# Patient Record
Sex: Female | Born: 1960 | Race: Black or African American | Hispanic: No | Marital: Single | Smoking: Never smoker
Health system: Southern US, Community
[De-identification: ages and names within clinical notes are randomized; demographics above are authoritative.]

## PROBLEM LIST (undated history)

## (undated) DIAGNOSIS — I10 Essential (primary) hypertension: Secondary | ICD-10-CM

## (undated) DIAGNOSIS — E119 Type 2 diabetes mellitus without complications: Secondary | ICD-10-CM

## (undated) DIAGNOSIS — K2970 Gastritis, unspecified, without bleeding: Secondary | ICD-10-CM

## (undated) DIAGNOSIS — K219 Gastro-esophageal reflux disease without esophagitis: Secondary | ICD-10-CM

## (undated) HISTORY — DX: Type 2 diabetes mellitus without complications: E11.9

## (undated) HISTORY — PX: CARPAL TUNNEL RELEASE: SHX101

## (undated) HISTORY — DX: Gastro-esophageal reflux disease without esophagitis: K21.9

## (undated) HISTORY — PX: TRIGGER FINGER RELEASE: SHX641

## (undated) HISTORY — PX: PARTIAL HYSTERECTOMY: SHX80

## (undated) HISTORY — DX: Gastritis, unspecified, without bleeding: K29.70

---

## 2012-03-03 ENCOUNTER — Emergency Department (HOSPITAL_COMMUNITY): Payer: BC Managed Care – PPO

## 2012-03-03 ENCOUNTER — Emergency Department (HOSPITAL_COMMUNITY)
Admission: EM | Admit: 2012-03-03 | Discharge: 2012-03-03 | Disposition: A | Discharge status: Home / Self Care | Payer: BC Managed Care – PPO | Attending: Emergency Medicine | Admitting: Emergency Medicine

## 2012-03-03 ENCOUNTER — Encounter (HOSPITAL_COMMUNITY): Payer: Self-pay

## 2012-03-03 DIAGNOSIS — M940 Chondrocostal junction syndrome [Tietze]: Secondary | ICD-10-CM | POA: Insufficient documentation

## 2012-03-03 DIAGNOSIS — Z79899 Other long term (current) drug therapy: Secondary | ICD-10-CM | POA: Insufficient documentation

## 2012-03-03 DIAGNOSIS — R109 Unspecified abdominal pain: Secondary | ICD-10-CM | POA: Insufficient documentation

## 2012-03-03 DIAGNOSIS — R1110 Vomiting, unspecified: Secondary | ICD-10-CM

## 2012-03-03 DIAGNOSIS — E119 Type 2 diabetes mellitus without complications: Secondary | ICD-10-CM | POA: Insufficient documentation

## 2012-03-03 DIAGNOSIS — Z794 Long term (current) use of insulin: Secondary | ICD-10-CM | POA: Insufficient documentation

## 2012-03-03 DIAGNOSIS — R112 Nausea with vomiting, unspecified: Secondary | ICD-10-CM | POA: Insufficient documentation

## 2012-03-03 HISTORY — PX: ESOPHAGOGASTRODUODENOSCOPY: SHX1529

## 2012-03-03 LAB — URINALYSIS, ROUTINE W REFLEX MICROSCOPIC
Bilirubin Urine: NEGATIVE
Glucose, UA: >1000 mg/dL — AB
Hgb urine dipstick: NEGATIVE
Ketones, ur: NEGATIVE mg/dL
Leukocytes, UA: NEGATIVE
Nitrite: NEGATIVE
Protein, ur: NEGATIVE mg/dL
Specific Gravity, Urine: 1.02 (ref 1.005–1.030)
Urobilinogen, UA: 0.2 mg/dL (ref 0.0–1.0)
pH: 6 (ref 5.0–8.0)

## 2012-03-03 LAB — GLUCOSE, CAPILLARY: Glucose-Capillary: 189 mg/dL — ABNORMAL HIGH (ref 70–99)

## 2012-03-03 LAB — URINE MICROSCOPIC-ADD ON

## 2012-03-03 MED ORDER — RANITIDINE HCL 150 MG PO TABS: 150.0000 mg | ORAL_TABLET | Freq: Two times a day (BID) | ORAL | Status: DC

## 2012-03-03 MED ORDER — METOCLOPRAMIDE HCL 10 MG PO TABS: 10.0000 mg | ORAL_TABLET | Freq: Four times a day (QID) | ORAL | Status: DC | PRN

## 2012-03-03 MED ORDER — SUCRALFATE 1 GM/10ML PO SUSP: 1.0000 g | Freq: Four times a day (QID) | ORAL | Status: DC

## 2012-03-03 NOTE — ED Provider Notes (Signed)
History     CSN: [id number]  Arrival date & time 03/03/12  1242   First MD Initiated Contact with Patient 03/03/12 1254      Chief Complaint  Patient presents with  . Abdominal Pain    (Consider location/radiation/quality/duration/timing/severity/associated sxs/prior treatment) HPI Comments: Patient presents to the ER with complaints of nausea, vomiting and abdominal pain for one month. Patient has been seen by her doctor as well as and another ER. She has had blood work and an ultrasound and all have been normal. Patient still experiencing pain. She reports that the pain is daily and across the upper abdomen and around both sides at the ribs. She is not expressing any cough, shortness of breath or chest pain. There is no fever.  Patient is a 52 y.o. female presenting with abdominal pain.  Abdominal Pain Associated symptoms: no chest pain, no cough, no fever and no shortness of breath     Past Medical History  Diagnosis Date  . Diabetes mellitus without complication     History reviewed. No pertinent past surgical history.  No family history on file.  History  Substance Use Topics  . Smoking status: Not on file  . Smokeless tobacco: Not on file  . Alcohol Use: No    OB History   Grav Para Term Preterm Abortions TAB SAB Ect Mult Living                  Review of Systems  Constitutional: Negative for fever.  Respiratory: Negative for cough and shortness of breath.   Cardiovascular: Negative for chest pain.  Gastrointestinal: Positive for abdominal pain.  All other systems reviewed and are negative.    Allergies  Review of patient's allergies indicates no known allergies.  Home Medications  No current outpatient prescriptions on file.  BP 117/68  Pulse 94  Temp(Src) 98.1 F (36.7 C) (Oral)  Resp 18  Ht 5\' 6"  (1.676 m)  Wt 180 lb (81.647 kg)  BMI 29.07 kg/m2  SpO2 100%  Physical Exam  Constitutional: She is oriented to person, place, and time. She  appears well-developed and well-nourished. No distress.  HENT:  Head: Normocephalic and atraumatic.  Right Ear: Hearing normal.  Nose: Nose normal.  Mouth/Throat: Oropharynx is clear and moist and mucous membranes are normal.  Eyes: Conjunctivae and EOM are normal. Pupils are equal, round, and reactive to light.  Neck: Normal range of motion. Neck supple.  Cardiovascular: Normal rate, regular rhythm, S1 normal and S2 normal.  Exam reveals no gallop and no friction rub.   No murmur heard. Pulmonary/Chest: Effort normal and breath sounds normal. No respiratory distress. She exhibits no tenderness.    Abdominal: Soft. Normal appearance and bowel sounds are normal. There is no hepatosplenomegaly. There is no tenderness. There is no rebound, no guarding, no tenderness at McBurney's point and negative Murphy's sign. No hernia.  Musculoskeletal: Normal range of motion.  Neurological: She is alert and oriented to person, place, and time. She has normal strength. No cranial nerve deficit or sensory deficit. Coordination normal. GCS eye subscore is 4. GCS verbal subscore is 5. GCS motor subscore is 6.  Skin: Skin is warm, dry and intact. No rash noted. No cyanosis.  Psychiatric: She has a normal mood and affect. Her speech is normal and behavior is normal. Thought content normal.    ED Course  Procedures (including critical care time)  Labs Reviewed - No data to display No results found.   Diagnosis: 1. Nausea  and vomiting 2. Costochondritis 3. Abdominal pain    MDM  Patient complaining of abdominal pain for one month. She has had a very thorough workup already. Patient indicates that she has had blood work by her primary physician and ultrasound in the ER, neither showed any abnormalities. Patient's abdominal exam is actually quite benign today. She has significant tenderness over the anterior costal margin bilaterally. There is no crepitance. Patient denies any injury. She has good lung  sounds. Chest x-ray was normal.  At this point, patient has a benign abdominal exam and has had a very thorough workup. I do not feel that repeat blood work or imaging at this time other than the performed chest x-ray are indicated. Symptoms might simply be costochondritis. There is no concern for cardiac illness, as the patient is experiencing upper chest pain and all pain is reproducible in the lower costal margin.  Patient will be treated for musculoskeletal chest wall inflammatory pain. She is referred back to her primary care physician. She reports nausea and vomiting, but no active vomiting. I did discuss with the patient that she may need followup with gastroenterology if the GI symptoms continue.     Gilda Crease, MD 03/03/12 [phone]

## 2012-03-03 NOTE — ED Notes (Signed)
Complain of vomiting and abdominal pain. Last BM yesterday

## 2012-03-12 ENCOUNTER — Encounter: Payer: Self-pay | Admitting: Urgent Care

## 2012-03-12 ENCOUNTER — Ambulatory Visit (INDEPENDENT_AMBULATORY_CARE_PROVIDER_SITE_OTHER): Payer: BC Managed Care – PPO | Admitting: Urgent Care

## 2012-03-12 VITALS — BP 141/82 | HR 97 | Temp 98.4°F | Ht 66.0 in | Wt 181.8 lb

## 2012-03-12 DIAGNOSIS — R1010 Upper abdominal pain, unspecified: Secondary | ICD-10-CM | POA: Insufficient documentation

## 2012-03-12 DIAGNOSIS — K5900 Constipation, unspecified: Secondary | ICD-10-CM | POA: Insufficient documentation

## 2012-03-12 DIAGNOSIS — R109 Unspecified abdominal pain: Secondary | ICD-10-CM

## 2012-03-12 MED ORDER — PEG 3350-KCL-NA BICARB-NACL 420 G PO SOLR: 4000.0000 mL | ORAL | Status: DC

## 2012-03-12 NOTE — Patient Instructions (Addendum)
Colonoscopy & EGD with Dr Darrick Penna Take half of your diabetes medications (GLUCOTROL & GLUCOPHAGE) and INSULIN (Levemir 50units) the day prior to the procedure Hold diabetes medications day of procedure Bring all your medications and/or any insulin to the hospital the day of the procedure. Follow blood sugars, call us or your PCP if any problems. Begin Nexium 40mg  daily Stop Zantac Avoid all NSAIDs like ALEVE, ADVIL, IBUPROFEN or ASPIRIN Use MIRALAX 17 grams daily as directed for constipation WE have requested all labs, xrays, etc from your PCP & [facility] to review

## 2012-03-12 NOTE — Assessment & Plan Note (Signed)
On exam she does have significant abdominal wall pain. Would consider physical therapy for management pending ruling out other GI tract etiologies.

## 2012-03-12 NOTE — Assessment & Plan Note (Signed)
May be contributing to abdominal pain. Colonoscopy in the near future she has never had screening colonoscopy.  I have discussed risks & benefits which include, but are not limited to, bleeding, infection, perforation & drug reaction.  The patient agrees with this plan & written consent will be obtained.    Use MIRALAX 17 grams daily as directed for constipation

## 2012-03-12 NOTE — Assessment & Plan Note (Addendum)
Brianna [name] is a pleasant 52 y.o. female with severe chronic upper abdominal pain.  She is quite tender along both costal margins and exhibits abdominal wall tenderness on exam. She has been taking a significant dose of Aleve for quite some time now for the pain. It is difficult to determine whether treatment with NSAIDs for costochondritis and abdominal wall pain has caused an NSAID-induced gastritis or peptic ulcer disease.  Cannot rule out underlying GI tract issues at the same time.  She reports a normal abdominal ultrasound, HIDA scan, and CT scan making pancreatitis or biliary colic unlikely. Constipation may be contributing to her abdominal pain as well.  EGD with Dr. Darrick Penna.  I have discussed risks & benefits which include, but are not limited to, bleeding, infection, perforation & drug reaction.  The patient agrees with this plan & written consent will be obtained.   Begin Nexium 40 mg daily, #15 samples given Take half of your diabetes medications (GLUCOTROL & GLUCOPHAGE) and INSULIN (Levemir 50units) the day prior to the procedure Hold diabetes medications day of procedure Bring all your medications and/or any insulin to the hospital the day of the procedure. Follow blood sugars, call us or your PCP if any problems. Stop Zantac Avoid all NSAIDs like ALEVE, ADVIL, IBUPROFEN or ASPIRIN WE have requested all labs, xrays, etc from your PCP & [facility] to review

## 2012-03-12 NOTE — Progress Notes (Signed)
Faxed to PCP

## 2012-03-12 NOTE — Progress Notes (Signed)
Primary Care Physician: Clayburn Pert [place], [place]) Primary Gastroenterologist:  Dr. Jonette Eva  Chief Complaint  Patient presents with  . Abdominal Pain    HPI:  Brianna [name] is a 52 y.o. female here as a new patient for evaluation of chronic upper abdominal pain.  C/o 2 month history pain that felt like "gas".  She has been having constipation as well.  Usually has daily BMs, but reports BM q3 days now.  Describes pain as "burning pain" & "can't wear a bra".  Pain 7-9/10.  Worse laying down.  Eating makes worse. Movement makes better.  At times feels like a "knot in her stomach".  Pain radiates to her back.  No rash or jaundice.  C/o NV only after pain meds in ER.  Denies N/V now.  Weight remained stable.  Appetite poor.  Denies rectal bleeding or melena.  [place] #1.  2/9 went to Immediate Care & was sent to ER.  She reports a normal HIDA scan, abdominal ultrasound, and CT scan at [facility] (we have requested records). She reports taking Aleve 2 tablets every 4 hrs for the pain.  Denies heartburn, indigestion, dysphagia, odynophagia or anorexia.  Tried prilosec 20mg  daily for 1 month.  She was taking when necessary TUMS but stopped because she felt she was taking too much.    Labs from 02/27/12: CMP glucose 228, creatinine 0.49, otherwise normal. CBC normal. Amylase, lipase normal. Hemoglobin A1c 10. CXR: No evidence of acute cardiopulmonary disease. Results for orders placed during the hospital encounter of 03/03/12 (from the past 2016 hour(s))  URINALYSIS, ROUTINE W REFLEX MICROSCOPIC   Collection Time    03/03/12  1:21 PM      Result Value Range   Color, Urine YELLOW  YELLOW   APPearance HAZY (*) CLEAR   Specific Gravity, Urine 1.020  1.005 - 1.030   pH 6.0  5.0 - 8.0   Glucose, UA >1000 (*) NEGATIVE mg/dL   Hgb urine dipstick NEGATIVE  NEGATIVE   Bilirubin Urine NEGATIVE  NEGATIVE   Ketones, ur NEGATIVE  NEGATIVE mg/dL   Protein, ur NEGATIVE  NEGATIVE mg/dL    Urobilinogen, UA 0.2  0.0 - 1.0 mg/dL   Nitrite NEGATIVE  NEGATIVE   Leukocytes, UA NEGATIVE  NEGATIVE  URINE MICROSCOPIC-ADD ON   Collection Time    03/03/12  1:21 PM      Result Value Range   Squamous Epithelial / LPF FEW (*) RARE   WBC, UA 0-2  <3 WBC/hpf   RBC / HPF 3-6  <3 RBC/hpf   Bacteria, UA FEW (*) RARE  GLUCOSE, CAPILLARY   Collection Time    03/03/12  1:25 PM      Result Value Range   Glucose-Capillary 189 (*) 70 - 99 mg/dL   Past Medical History  Diagnosis Date  . Diabetes mellitus without complication     insulin dependent   Past Surgical History  Procedure Laterality Date  . Partial hysterectomy      Current Outpatient Prescriptions  Medication Sig Dispense Refill  . glipiZIDE (GLUCOTROL) 10 MG tablet Take 10 mg by mouth daily.      . insulin detemir (LEVEMIR) 100 UNIT/ML injection Inject 100 Units into the skin daily.      . metFORMIN (GLUCOPHAGE) 500 MG tablet Take 500 mg by mouth daily.      . naproxen sodium (ANAPROX) 220 MG tablet Take 220 mg by mouth See admin instructions. Pt is taking 2 every 4 hrs for pain      .  rosuvastatin (CRESTOR) 20 MG tablet Take 20 mg by mouth daily.      . traMADol (ULTRAM) 50 MG tablet Take 50 mg by mouth every 6 (six) hours as needed for pain.      . polyethylene glycol-electrolytes (TRILYTE) 420 G solution Take 4,000 mLs by mouth as directed.  4000 mL  0  . ranitidine (ZANTAC) 150 MG tablet Take 1 tablet (150 mg total) by mouth 2 (two) times daily.  60 tablet  0  . RELION SHORT PEN NEEDLES 31G X 8 MM MISC        No current facility-administered medications for this visit.    Allergies as of 03/12/2012 - Review Complete 03/12/2012  Allergen Reaction Noted  . Other Hives and Shortness Of Breath 03/03/2012  . Iodine Hives 03/03/2012  . Neosporin (neomycin-bacitracin zn-polymyx) Rash 03/03/2012    Family History  Problem Relation Age of Onset  . Lupus Sister   . Stroke Brother   . Diabetes Father   . Stroke  Mother     History   Social History  . Marital Status: Married    Spouse Name: N/A    Number of Children: 2  . 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 Smoking status: Never Smoker   . Smokeless tobacco: Not on file  . Alcohol Use: No  . Drug Use: No  . Sexually Active: Not on file   Other Topics Concern  . Not on file   Social History Narrative   Lives w/ husband 2 kids-17/20 healthy    Review of Systems: Gen: Denies any fever, chills, sweats, anorexia, fatigue, weakness, malaise, weight loss, and sleep disorder CV: Denies chest pain, angina, palpitations, syncope, orthopnea, PND, peripheral edema, and claudication. Resp: Denies dyspnea at rest, dyspnea with exercise, cough, sputum, wheezing, coughing up blood, and pleurisy. GI: Denies vomiting blood, jaundice, and fecal incontinence.    GU : Denies urinary burning, blood in urine, urinary frequency, urinary hesitancy, nocturnal urination, and urinary incontinence. MS: Denies joint pain, limitation of movement, and swelling, stiffness, low back pain, extremity pain. Denies muscle weakness, cramps, atrophy.  Derm: Denies rash, itching, dry skin, hives, moles, warts, or unhealing ulcers.  Psych: Denies depression, anxiety, memory loss, suicidal ideation, hallucinations, paranoia, and confusion. Heme: Denies bruising, bleeding, and enlarged lymph nodes. Neuro:  Denies any headaches, dizziness, paresthesias. Endo:  Denies any problems with DM, thyroid, adrenal function.  Physical Exam: BP 141/82  Pulse 97  Temp(Src) 98.4 F (36.9 C) (Oral)  Ht 5\' 6"  (1.676 m)  Wt 181 lb 12.8 oz (82.464 kg)  BMI 29.36 kg/m2 No LMP recorded. Patient has had a hysterectomy. General:   Alert,  Well-developed, well-nourished, pleasant and cooperative in NAD Head:  Normocephalic and atraumatic. Eyes:  Sclera clear, no icterus.   Conjunctiva pink. Ears:   Normal auditory acuity. Nose:  No deformity, discharge, or lesions. Mouth:  No deformity or lesions,oropharynx pink & moist. Neck:  Supple; no masses or thyromegaly. Lungs:  Clear throughout to auscultation.   No wheezes, crackles, or rhonchi. No acute distress. Heart:  Regular rate and rhythm; no murmurs, clicks, rubs,  or gallops. Abdomen:  Normal bowel sounds.  No bruits.  Soft, non-distended without masses, hepatosplenomegaly or hernias noted.  +Carnett sign.  +Bilat costal margin tenderness to palpation.  No guarding or rebound tenderness.   Rectal:  Deferred. Msk:  Symmetrical without gross deformities.  Normal posture. Pulses:  Normal pulses noted. Extremities:  No clubbing or edema. Neurologic:  Alert and  oriented x4;  grossly normal neurologically. Skin:  Intact without significant lesions or rashes. Lymph Nodes:  No significant cervical adenopathy. Psych:  Alert and cooperative. Normal mood and affect.

## 2012-03-13 NOTE — Progress Notes (Signed)
[facility] records reviewed: 02/12/12 ABD [place]: patchy hepatic steatosis 02/12/12 Non-contrast CT A/P: normal 03/02/12 HIDA: low normal EF 37%

## 2012-03-15 LAB — CBC
Amylase: 63 units/L (ref 25–110)
HCT: 37 %
Hemoglobin: 13.1 g/dL (ref 12.0–16.0)
Hgb A1c MFr Bld: 10 % — AB (ref 4.0–6.0)
Lipase: 32 units/L (ref 0–53)
WBC: 4.9

## 2012-03-15 LAB — COMPREHENSIVE METABOLIC PANEL
ALT: 15 U/L (ref 7–35)
AST: 11 U/L
Albumin: 4.1
Alkaline Phosphatase: 103 U/L
BUN/Creatinine Ratio: 22
BUN: 11 mg/dL (ref 4–21)
Calcium: 9.4 mg/dL
Chloride: 26 mmol/L
Creat: 0.49
Glucose: 228 mg/dL
Potassium: 4.1 mmol/L
Sodium: 141 mmol/L (ref 137–147)
Total Bilirubin: 0.2 mg/dL
Total Protein: 6.7 g/dL

## 2012-03-23 ENCOUNTER — Telehealth: Payer: Self-pay | Admitting: Gastroenterology

## 2012-03-23 NOTE — Telephone Encounter (Signed)
She should use tylenol arthritis as directed for pain. If pain is severe, to ER. Thanks

## 2012-03-23 NOTE — Telephone Encounter (Signed)
Pt called to say she is scheduled for a procedure with SF on 3/31 and was asking if we could call something in to [facility] for pain. [phone]

## 2012-03-23 NOTE — Telephone Encounter (Signed)
Called and informed pt.  

## 2012-03-26 ENCOUNTER — Encounter (HOSPITAL_COMMUNITY): Payer: Self-pay | Admitting: Pharmacy Technician

## 2012-03-30 MED ORDER — SODIUM CHLORIDE 0.45 % IV SOLN: INTRAVENOUS | Status: DC

## 2012-04-02 ENCOUNTER — Encounter (HOSPITAL_COMMUNITY): Payer: Self-pay | Admitting: *Deleted

## 2012-04-02 ENCOUNTER — Encounter (HOSPITAL_COMMUNITY)
Admission: RE | Disposition: A | Discharge status: Home / Self Care | Payer: Self-pay | Source: Ambulatory Visit | Attending: Gastroenterology

## 2012-04-02 ENCOUNTER — Ambulatory Visit (HOSPITAL_COMMUNITY)
Admission: RE | Admit: 2012-04-02 | Discharge: 2012-04-02 | Disposition: A | Discharge status: Home / Self Care | Payer: BC Managed Care – PPO | Source: Ambulatory Visit | Attending: Gastroenterology | Admitting: Gastroenterology

## 2012-04-02 DIAGNOSIS — K648 Other hemorrhoids: Secondary | ICD-10-CM | POA: Insufficient documentation

## 2012-04-02 DIAGNOSIS — K621 Rectal polyp: Secondary | ICD-10-CM

## 2012-04-02 DIAGNOSIS — K2940 Chronic atrophic gastritis without bleeding: Secondary | ICD-10-CM | POA: Insufficient documentation

## 2012-04-02 DIAGNOSIS — K2990 Gastroduodenitis, unspecified, without bleeding: Secondary | ICD-10-CM

## 2012-04-02 DIAGNOSIS — R1010 Upper abdominal pain, unspecified: Secondary | ICD-10-CM

## 2012-04-02 DIAGNOSIS — D128 Benign neoplasm of rectum: Secondary | ICD-10-CM | POA: Insufficient documentation

## 2012-04-02 DIAGNOSIS — Z1211 Encounter for screening for malignant neoplasm of colon: Secondary | ICD-10-CM

## 2012-04-02 DIAGNOSIS — Z794 Long term (current) use of insulin: Secondary | ICD-10-CM | POA: Insufficient documentation

## 2012-04-02 DIAGNOSIS — K2970 Gastritis, unspecified, without bleeding: Secondary | ICD-10-CM

## 2012-04-02 DIAGNOSIS — K5900 Constipation, unspecified: Secondary | ICD-10-CM

## 2012-04-02 DIAGNOSIS — R1013 Epigastric pain: Secondary | ICD-10-CM

## 2012-04-02 DIAGNOSIS — R109 Unspecified abdominal pain: Secondary | ICD-10-CM

## 2012-04-02 DIAGNOSIS — Z01812 Encounter for preprocedural laboratory examination: Secondary | ICD-10-CM | POA: Insufficient documentation

## 2012-04-02 DIAGNOSIS — E119 Type 2 diabetes mellitus without complications: Secondary | ICD-10-CM | POA: Insufficient documentation

## 2012-04-02 DIAGNOSIS — D129 Benign neoplasm of anus and anal canal: Secondary | ICD-10-CM | POA: Insufficient documentation

## 2012-04-02 DIAGNOSIS — K620 Anal polyp: Secondary | ICD-10-CM

## 2012-04-02 HISTORY — PX: COLONOSCOPY WITH ESOPHAGOGASTRODUODENOSCOPY (EGD): SHX5779

## 2012-04-02 LAB — GLUCOSE, CAPILLARY
Glucose-Capillary: 309 mg/dL — ABNORMAL HIGH (ref 70–99)
Glucose-Capillary: 258 mg/dL — ABNORMAL HIGH (ref 70–99)

## 2012-04-02 SURGERY — COLONOSCOPY WITH ESOPHAGOGASTRODUODENOSCOPY (EGD)
Anesthesia: Moderate Sedation

## 2012-04-02 MED ORDER — MIDAZOLAM HCL 5 MG/5ML IJ SOLN
INTRAMUSCULAR | Status: DC | PRN
  Administered 2012-04-02 (×2): 2 mg via INTRAVENOUS
  Administered 2012-04-02 (×2): 1 mg via INTRAVENOUS

## 2012-04-02 MED ORDER — MEPERIDINE HCL 100 MG/ML IJ SOLN
INTRAMUSCULAR | Status: AC
  Filled 2012-04-02: qty 1

## 2012-04-02 MED ORDER — MIDAZOLAM HCL 5 MG/5ML IJ SOLN
INTRAMUSCULAR | Status: AC
  Filled 2012-04-02: qty 10

## 2012-04-02 MED ORDER — RANITIDINE HCL 150 MG PO TABS: 150.0000 mg | ORAL_TABLET | Freq: Two times a day (BID) | ORAL | Status: DC

## 2012-04-02 MED ORDER — OMEPRAZOLE 20 MG PO CPDR: DELAYED_RELEASE_CAPSULE | ORAL | Status: DC

## 2012-04-02 MED ORDER — LINACLOTIDE 145 MCG PO CAPS: ORAL_CAPSULE | ORAL | Status: DC

## 2012-04-02 MED ORDER — MEPERIDINE HCL 100 MG/ML IJ SOLN
INTRAMUSCULAR | Status: DC | PRN
  Administered 2012-04-02 (×4): 25 mg via INTRAVENOUS

## 2012-04-02 NOTE — Op Note (Signed)
[facility] [address] [place], [address]   ENDOSCOPY PROCEDURE REPORT  PATIENT: Brianna [name], Brianna [name]  MR#: [id number] BIRTHDATE: April 02, 1960 , 52  yrs. old GENDER: Female  ENDOSCOPIST: Jonette Eva, MD REFERRED BY:   Clayburn Pert [place], [place] PROCEDURE DATE: 04/02/2012 PROCEDURE:   EGD w/ biopsy for H.pylori  INDICATIONS:Epigastric pain. MEDICATIONS: TCS+ Demerol 25 mg IV and Versed 1mg  IV TOPICAL ANESTHETIC:   Cetacaine Spray  DESCRIPTION OF PROCEDURE:     Physical exam was performed.  Informed consent was obtained from the patient after explaining the benefits, risks, and alternatives to the procedure.  The patient was connected to the monitor and placed in the left lateral position.  Continuous oxygen was provided by nasal cannula and IV medicine administered through an indwelling cannula.  After administration of sedation, the patients esophagus was intubated and the EC-3890Li ([id number]) and EG-[id number] ([id number])  endoscope was advanced under direct visualization to the second portion of the duodenum.  The scope was removed slowly by carefully examining the color, texture, anatomy, and integrity of the mucosa on the way out.  The patient was recovered in endoscopy and discharged home in satisfactory condition.   ESOPHAGUS: The mucosa of the esophagus appeared normal.  STOMACH: Mild gastritis (inflammation) was found in the gastric antrum. Multiple biopsies were performed using cold forceps.   DUODENUM: The duodenal mucosa showed no abnormalities in the bulb and second portion of the duodenum.  COMPLICATIONS:   None  ENDOSCOPIC IMPRESSION: 1.   MILD  Gastritis (inflammation) uodenum  RECOMMENDATIONS: START OMEPRAZOLE TO TREAT GASTRITIS.  TAKE 30 MINUTES PRIOR TO YOUR MEALS TWICE DAILY FOR 3 MOS THEN ONCE DAILY FOR THE NEXT YEAR. START LINZESS EVERY MORNING TO TREAT CONSTIPATION. AVOID ITEMS THAT TRIGGER GASTRITIS. FOLLOW A HIGH  FIBER/LOW FAT DIET.  AVOID ITEMS THAT CAUSE BLOATING.  DRINK WATER TO KEEP YOUR URINE LIGHT YELLOW. OPV JUL 2014 Next colonoscopy in 10 years.   REPEAT EXAM:   _______________________________ Rosalie DoctorJonette Eva, MD 04/02/2012 3:39 PM       PATIENT NAME:  Brianna [name], Brianna [name] MR#: [id number]

## 2012-04-02 NOTE — H&P (Signed)
  Primary Care Physician:  Pcp Not In System Primary Gastroenterologist:  Dr. Darrick Penna  Pre-Procedure History & Physical: HPI:  Brianna [name] is a 52 y.o. female here for dyspepsia & screening.  Past Medical History  Diagnosis Date  . Diabetes mellitus without complication     insulin dependent    Past Surgical History  Procedure Laterality Date  . Partial hysterectomy    . Carpal tunnel release      left    Prior to Admission medications   Medication Sig Start Date End Date Taking? Authorizing Provider  acetaminophen (TYLENOL) 650 MG CR tablet Take 650 mg by mouth every 8 (eight) hours as needed for pain.   Yes Historical Provider, MD  glipiZIDE (GLUCOTROL) 10 MG tablet Take 10 mg by mouth daily.   Yes Historical Provider, MD  insulin detemir (LEVEMIR) 100 UNIT/ML injection Inject 100 Units into the skin daily.   Yes Historical Provider, MD  metFORMIN (GLUCOPHAGE) 500 MG tablet Take 500 mg by mouth daily.   Yes Historical Provider, MD  ranitidine (ZANTAC) 150 MG tablet Take 1 tablet (150 mg total) by mouth 2 (two) times daily. 03/03/12  Yes Gilda Crease, MD  rosuvastatin (CRESTOR) 20 MG tablet Take 20 mg by mouth daily.   Yes Historical Provider, MD    Allergies as of 03/12/2012 - Review Complete 03/12/2012  Allergen Reaction Noted  . Other Hives and Shortness Of Breath 03/03/2012  . Iodine Hives 03/03/2012  . Neosporin (neomycin-bacitracin zn-polymyx) Rash 03/03/2012    Family History  Problem Relation Age of Onset  . Lupus Sister   . Stroke Brother   . Diabetes Father   . Stroke Mother     History   Social History  . Marital Status: Married    Spouse Name: N/A    Number of Children: 2  . 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 Smoking status: Never Smoker   . Smokeless tobacco: Not on file  . Alcohol Use: No  . Drug Use: No  . Sexually Active: Not on file    Other Topics Concern  . Not on file   Social History Narrative   Lives w/ husband 2 kids-17/20 healthy    Review of Systems: See HPI, otherwise negative ROS   Physical Exam: BP 132/84  Pulse 97  Resp 18  SpO2 97% General:   Alert,  pleasant and cooperative in NAD Head:  Normocephalic and atraumatic. Neck:  Supple; Lungs:  Clear throughout to auscultation.    Heart:  Regular rate and rhythm. Abdomen:  Soft, nontender and nondistended. Normal bowel sounds, without guarding, and without rebound.   Neurologic:  Alert and  oriented x4;  grossly normal neurologically.  Impression/Plan:     DYSPEPSIA/screening  PLAN:  EGD/TCSTODAY

## 2012-04-02 NOTE — Op Note (Signed)
[facility] [address] [place], [address]   COLONOSCOPY PROCEDURE REPORT  PATIENT: [name], [name]  MR#: [id number] BIRTHDATE: [birth year] , 52  yrs. old GENDER: Female ENDOSCOPIST: Jonette Eva, MD REFERRED BY:   Clayburn Pert [place], [place] PROCEDURE DATE:  04/02/2012 PROCEDURE:   Colonoscopy with cold biopsy polypectomy INDICATIONS: AVERAGE RISK SCREENING PMHX: CONSTIPATION, ANAPROX USE.  MEDICATIONS: Demerol 75 mg IV and Versed 5 mg IV  DESCRIPTION OF PROCEDURE:    Physical exam was performed.  Informed consent was obtained from the patient after explaining the benefits, risks, and alternatives to procedure.  The patient was connected to monitor and placed in left lateral position. Continuous oxygen was provided by nasal cannula and IV medicine administered through an indwelling cannula.  After administration of sedation and rectal exam, the patients rectum was intubated and the EC-3890Li ([id number])  colonoscope was advanced under direct visualization to the cecum.  The scope was removed slowly by carefully examining the color, texture, anatomy, and integrity mucosa on the way out.  The patient was recovered in endoscopy and discharged home in satisfactory condition.       COLON FINDINGS: A sessile polyp measuring 3 mm in size was found in the rectum.  A polypectomy was performed with cold forceps.  , The colon was otherwise normal.  There was no diverticulosis, inflammation, polyps or cancers unless previously stated.  , Small internal hemorrhoids were found.  , and The colon was redundant. The patient was moved on to their back to reach the cecum.  Manual abdominal counter-pressure was used to reach the cecum.  PREP QUALITY: good. CECAL W/D TIME: 14 minutes COMPLICATIONS: None  ENDOSCOPIC IMPRESSION: 1.   SMALL RECTAL polyp 2.   Small internal hemorrhoids   RECOMMENDATIONS: FOLLOW A HIGH FIBER DIET.  AVOID ITEMS THAT CAUSE  BLOATING. DRINK WATER TO KEEP YOUR URINE LIGHT YELLOW. ADD LINZESS QAM. OPV IN AUG 2014 Next colonoscopy in 10 years WITH OVERTUBE/?PROPOFOL       _______________________________ eSignedJonette Eva, MD 04/02/2012 3:31 PM     PATIENT NAME:  Brianna [name], Brianna [name] MR#: [id number]

## 2012-04-04 ENCOUNTER — Encounter (HOSPITAL_COMMUNITY): Payer: Self-pay | Admitting: Gastroenterology

## 2012-04-04 ENCOUNTER — Telehealth: Payer: Self-pay | Admitting: Gastroenterology

## 2012-04-04 NOTE — Telephone Encounter (Signed)
Pt called back and said the Amitiza was over $400.00 and she cannot afford that either. Please advise!

## 2012-04-04 NOTE — Telephone Encounter (Signed)
Routing to Dr. Fields.  

## 2012-04-04 NOTE — Telephone Encounter (Signed)
PLEASE CALL PT. IF SHE CAN'T AFFORD LINZESS OR AMITIZA SHE SHOULD USE MIRALAX DAILY FOR 7 DAYS AND IF SHE DOESN'T HAVE A SATISFACTORY BM THEN SHE SHOULD INCREASE THE DOSE TO TWICE DAILY.

## 2012-04-04 NOTE — Telephone Encounter (Signed)
CALL IN RX FOR AMITIZA 24 MCG PO BID #[id number], [id number].

## 2012-04-04 NOTE — Telephone Encounter (Signed)
Taking Linaclotide caps, Dr. Darrick Penna told the patient if this is to expensive taking 1 a day should would change is to twice daily and it would be cheaper. She would like new directions and medication called to [facility] at [phone] please advise?

## 2012-04-04 NOTE — Telephone Encounter (Signed)
Called and informed pt.  

## 2012-04-04 NOTE — Telephone Encounter (Signed)
CANCEL RX FOR LINZESS.

## 2012-04-04 NOTE — Telephone Encounter (Signed)
Called the Rx to [facility] and spoke to [employer]. Informed pt also and told her to make sure she takes it with food.

## 2012-04-10 ENCOUNTER — Telehealth: Payer: Self-pay | Admitting: Gastroenterology

## 2012-04-10 NOTE — Telephone Encounter (Signed)
Called and informed pt.  

## 2012-04-10 NOTE — Telephone Encounter (Signed)
Please call pt. HER stomach Bx shows gastritis. HER ABD PAIN IS MOST LIKELY DUE TO CONSTIPATION/GASTRITIS.  CONTINUE OMEPRAZOLE TO TREAT GASTRITIS.  TAKE 30 MINUTES PRIOR TO YOUR MEALS TWICE DAILY FOR 3 MOS THEN ONCE DAILY FOR THE NEXT YEAR.  USE MIRALAX TO TREAT YOUR CONSTIPATION  AVOID ITEMS THAT TRIGGER GASTRITIS. SEE INFO BELOW  FOLLOW A HIGH FIBER/LOW FAT DIET. AVOID ITEMS THAT CAUSE BLOATING.   DRINK WATER TO KEEP YOUR URINE LIGHT YELLOW.  OPV JUL 2014 E30 SLF.  Next colonoscopy in 10 years.

## 2012-04-10 NOTE — Telephone Encounter (Signed)
Cc: PCP 10 yr TCS nicd

## 2012-06-23 NOTE — Progress Notes (Signed)
REVIEWED.  EGD MAR 2014 GASTRITIS Results TCS MAR 2014 [facility]  TCS IN 2024

## 2012-06-25 NOTE — Progress Notes (Signed)
Cc PCP 

## 2012-08-24 ENCOUNTER — Encounter: Payer: Self-pay | Admitting: Gastroenterology

## 2012-08-27 ENCOUNTER — Ambulatory Visit (INDEPENDENT_AMBULATORY_CARE_PROVIDER_SITE_OTHER): Payer: BC Managed Care – PPO | Admitting: Gastroenterology

## 2012-08-27 ENCOUNTER — Encounter: Payer: Self-pay | Admitting: Gastroenterology

## 2012-08-27 VITALS — BP 116/75 | HR 128 | Temp 98.4°F | Ht 67.0 in | Wt 160.8 lb

## 2012-08-27 DIAGNOSIS — R1010 Upper abdominal pain, unspecified: Secondary | ICD-10-CM

## 2012-08-27 DIAGNOSIS — R634 Abnormal weight loss: Secondary | ICD-10-CM

## 2012-08-27 DIAGNOSIS — R109 Unspecified abdominal pain: Secondary | ICD-10-CM

## 2012-08-27 LAB — COMPREHENSIVE METABOLIC PANEL
ALT: 13 U/L (ref 0–35)
AST: 11 U/L (ref 0–37)
Albumin: 4.4 g/dL (ref 3.5–5.2)
Alkaline Phosphatase: 91 U/L (ref 39–117)
BUN: 12 mg/dL (ref 6–23)
CO2: 25 mEq/L (ref 19–32)
Calcium: 9.9 mg/dL (ref 8.4–10.5)
Chloride: 100 mEq/L (ref 96–112)
Creat: 0.62 mg/dL (ref 0.50–1.10)
Glucose, Bld: 296 mg/dL — ABNORMAL HIGH (ref 70–99)
Potassium: 4 mEq/L (ref 3.5–5.3)
Sodium: 137 mEq/L (ref 135–145)
Total Bilirubin: 0.6 mg/dL (ref 0.3–1.2)
Total Protein: 7.2 g/dL (ref 6.0–8.3)

## 2012-08-27 LAB — CBC WITH DIFFERENTIAL/PLATELET
Basophils Absolute: 0 10*3/uL (ref 0.0–0.1)
Basophils Relative: 1 % (ref 0–1)
Eosinophils Absolute: 0.1 10*3/uL (ref 0.0–0.7)
Eosinophils Relative: 1 % (ref 0–5)
HCT: 42.1 % (ref 36.0–46.0)
Hemoglobin: 14.9 g/dL (ref 12.0–15.0)
Lymphocytes Relative: 42 % (ref 12–46)
Lymphs Abs: 2.3 10*3/uL (ref 0.7–4.0)
MCH: 30.1 pg (ref 26.0–34.0)
MCHC: 35.4 g/dL (ref 30.0–36.0)
MCV: 85.1 fL (ref 78.0–100.0)
Monocytes Absolute: 0.3 10*3/uL (ref 0.1–1.0)
Monocytes Relative: 6 % (ref 3–12)
Neutro Abs: 2.8 10*3/uL (ref 1.7–7.7)
Neutrophils Relative %: 50 % (ref 43–77)
Platelets: 280 10*3/uL (ref 150–400)
RBC: 4.95 MIL/uL (ref 3.87–5.11)
RDW: 13.8 % (ref 11.5–15.5)
WBC: 5.5 10*3/uL (ref 4.0–10.5)

## 2012-08-27 LAB — LIPASE: Lipase: 13 U/L (ref 0–75)

## 2012-08-27 LAB — TSH: TSH: 1.278 u[IU]/mL (ref 0.350–4.500)

## 2012-08-27 MED ORDER — HYDROCODONE-ACETAMINOPHEN 5-325 MG PO TABS: 1.0000 | ORAL_TABLET | Freq: Four times a day (QID) | ORAL | Status: DC | PRN

## 2012-08-27 NOTE — Assessment & Plan Note (Signed)
Persistent upper abdominal pain along with costal margin tenderness but now more LUQ/left flank pain and 20 pound weight loss. Prior CT without IV contrast. Abdominal wall/musculoskeletal pain likely contributing but 20 pound weight loss, anorexia is concerning.   Labs. Discuss with Dr. Darrick Penna. ?repeat CT but with IV contrast to exclude pancreatic abnormality.

## 2012-08-27 NOTE — Progress Notes (Signed)
Primary Care Physician: Venia Minks [facility] [place]) Primary Gastroenterologist:  Jonette Eva, MD   Chief Complaint  Patient presents with  . Abdominal Pain    HPI: Brianna [name] is a 52 y.o. female here for three-month followup. Was initially seen in March of this year. She has a history of chronic upper abdominal pain and constipation. Some of her pain was felt to be due to abdominal wall pain. Abdominal ultrasound showed fatty liver. CT of the abdomen pelvis without contrast showed fatty liver. HIDA scan showed gallbladder ejection fraction 37%. Colonoscopy in March showed a hyperplastic rectal polyp, small internal hemorrhoids. EGD showed mild gastritis but no H. pylori. She could not afford the Amitiza or Linzess. So she started Miralax.   Left upper/left flank pain. Back feels like someone stretching skin. Left flank, twisting pain. Never pain free. Tramadol does not help. Stays away from Aleve since EGD. BM regular. No melena, brbpr. Some heartburn but ok. Taking omeprazole once per day. Cut back on greasy foods. Eating makes it worse. Sugars running high. Hurts for shirt to touch. New since last OV, LUQ/left flank pain. No N/V. Tired of hurting. Appetite poor. Weight down 20 pounds.  Current Outpatient Prescriptions  Medication Sig Dispense Refill  . acetaminophen (TYLENOL) 650 MG CR tablet Take 650 mg by mouth every 8 (eight) hours as needed for pain.      Marland Kitchen glipiZIDE (GLUCOTROL) 10 MG tablet Take 10 mg by mouth daily.      . insulin detemir (LEVEMIR) 100 UNIT/ML injection Inject 70 Units into the skin daily.       Marland Kitchen omeprazole (PRILOSEC) 20 MG capsule Take 20 mg by mouth 2 (two) times daily. 1 po bid 30 minutes before meals for 3 mos then once daily FOR THE NEXT YEAR      . polyethylene glycol (MIRALAX / GLYCOLAX) packet Take 17 g by mouth daily.      . rosuvastatin (CRESTOR) 20 MG tablet Take 20 mg by mouth daily.       No current facility-administered  medications for this visit.    Allergies as of 08/27/2012 - Review Complete 08/27/2012  Allergen Reaction Noted  . Other Hives and Shortness Of Breath 03/03/2012  . Iodine Hives 03/03/2012  . Neosporin [neomycin-bacitracin zn-polymyx] Rash 03/03/2012    ROS:  General: Negative for fever, chills, fatigue, weakness. See hpi. ENT: Negative for hoarseness, difficulty swallowing , nasal congestion. CV: Negative for chest pain, angina, palpitations, dyspnea on exertion, peripheral edema.  Respiratory: Negative for dyspnea at rest, dyspnea on exertion, cough, sputum, wheezing.  GI: See history of present illness. GU:  Negative for dysuria, hematuria, urinary incontinence, urinary frequency, nocturnal urination.  Endo:see hpi   Physical Examination:   BP 116/75  Pulse 128  Temp(Src) 98.4 F (36.9 C) (Oral)  Ht 5\' 7"  (1.702 m)  Wt 160 lb 12.8 oz (72.938 kg)  BMI 25.18 kg/m2  General: Well-nourished, well-developed in no acute distress.  Eyes: No icterus. Mouth: Oropharyngeal mucosa moist and pink , no lesions erythema or exudate. Lungs: Clear to auscultation bilaterally.  Heart: Regular rate and rhythm, no murmurs rubs or gallops.  Abdomen: Bowel sounds are normal, luq/left flank pain, pain with palpation of both lower ribcage margins. No cva tenderness, nondistended, no hepatosplenomegaly or masses, no abdominal bruits or hernia , no rebound or guarding.   Extremities: No lower extremity edema. No clubbing or deformities. Neuro: Alert and oriented x 4   Skin: Warm and dry, no jaundice.  Psych: Alert and cooperative, normal mood and affect.

## 2012-08-27 NOTE — Patient Instructions (Addendum)
1.  Please have your bloodwork done today

## 2012-08-28 NOTE — Progress Notes (Signed)
NO PCP

## 2012-08-29 ENCOUNTER — Telehealth: Payer: Self-pay | Admitting: Gastroenterology

## 2012-08-29 NOTE — Telephone Encounter (Signed)
I called pt to let her know that I have not received lab results from LL yet, she is busy seeing pts. I will call when I have the results.  She said to let LL know that she is in pain. She said her left side hurts ( she rates at an 8 now) and it hurts most of the time. She is not having any n/v/d . Having regular BM's and not constipated.   I informed Tana Coast, PA , who said that she will address today. She is busy with patients now.

## 2012-08-29 NOTE — Telephone Encounter (Signed)
Pt called to see if her lab results were back yet. Please call her at [phone]

## 2012-08-30 ENCOUNTER — Other Ambulatory Visit: Payer: Self-pay | Admitting: Gastroenterology

## 2012-08-30 DIAGNOSIS — R109 Unspecified abdominal pain: Secondary | ICD-10-CM

## 2012-08-30 DIAGNOSIS — R1010 Upper abdominal pain, unspecified: Secondary | ICD-10-CM

## 2012-08-30 DIAGNOSIS — R634 Abnormal weight loss: Secondary | ICD-10-CM

## 2012-08-30 NOTE — Telephone Encounter (Signed)
Routing to Soledad Gerlach for CT to schedule.

## 2012-08-30 NOTE — Telephone Encounter (Signed)
Called and informed pt.  

## 2012-08-30 NOTE — Telephone Encounter (Signed)
Please let the patient know her labs were fine except her sugar was 296.  She needs CT A/P WITH IV contrast for LUQ pain, 20 pound weight loss. She has shellfish allergy but no concern for IV contrast, I confirmed with CT department.

## 2012-08-30 NOTE — Telephone Encounter (Signed)
CT is scheduled for Tuesday 9/2 at 2:30 I told patient to pick up oral contrast before Tuesday

## 2012-08-30 NOTE — Telephone Encounter (Signed)
Pt left VM asking for results.  

## 2012-09-04 ENCOUNTER — Ambulatory Visit (HOSPITAL_COMMUNITY)
Admission: RE | Admit: 2012-09-04 | Discharge: 2012-09-04 | Disposition: A | Discharge status: Home / Self Care | Payer: BC Managed Care – PPO | Source: Ambulatory Visit | Attending: Gastroenterology | Admitting: Gastroenterology

## 2012-09-04 DIAGNOSIS — R1010 Upper abdominal pain, unspecified: Secondary | ICD-10-CM

## 2012-09-04 DIAGNOSIS — E119 Type 2 diabetes mellitus without complications: Secondary | ICD-10-CM | POA: Insufficient documentation

## 2012-09-04 DIAGNOSIS — R1032 Left lower quadrant pain: Secondary | ICD-10-CM | POA: Insufficient documentation

## 2012-09-04 DIAGNOSIS — R634 Abnormal weight loss: Secondary | ICD-10-CM

## 2012-09-04 DIAGNOSIS — R109 Unspecified abdominal pain: Secondary | ICD-10-CM

## 2012-09-04 DIAGNOSIS — K7689 Other specified diseases of liver: Secondary | ICD-10-CM | POA: Insufficient documentation

## 2012-09-04 MED ORDER — IOHEXOL 300 MG/ML  SOLN
100.0000 mL | Freq: Once | INTRAMUSCULAR | Status: AC | PRN
  Administered 2012-09-04: 100 mL via INTRAVENOUS

## 2012-09-05 NOTE — Progress Notes (Signed)
Quick Note:  Nothing to explain pain or weight loss.  I will see if Dr. Darrick Penna has any other suggestions.  ?musculoskeletal source for pain but concerned regarding weight loss. ______

## 2012-09-06 NOTE — Progress Notes (Signed)
PT WITH CHRONIC ABDOMINAL PAIN AND WEIGHT LOSS. CONSIDER GYN OR GENERAL SURGERY REFERRAL FOR EXPLORATORY LAPAROTOMY.

## 2012-09-06 NOTE — Progress Notes (Signed)
REVIEWED.  

## 2012-09-06 NOTE — Progress Notes (Signed)
Quick Note:  Called and informed pt. She said she would like to have something for pain in her stomach and back. Please advise! ______

## 2012-09-14 ENCOUNTER — Other Ambulatory Visit: Payer: Self-pay | Admitting: Gastroenterology

## 2012-09-14 MED ORDER — HYDROCODONE-ACETAMINOPHEN 5-325 MG PO TABS
1.0000 | ORAL_TABLET | Freq: Four times a day (QID) | ORAL | Status: DC | PRN
Start: 2012-09-14 — End: 2015-09-24

## 2012-09-14 NOTE — Progress Notes (Signed)
Quick Note:  Please let patient know, Dr. Darrick Penna recommends surgical referral for abdominal pain/weight loss. ?exploratory surgery. Nothing we have done so far has explained her pain. If patient does not feel comfortable with that, I would recommend OV with SLF only.  RX for Vicodin written. ______

## 2012-09-17 NOTE — Progress Notes (Signed)
Quick Note:  LMOM to call. ______ 

## 2012-09-17 NOTE — Progress Notes (Signed)
LMOM. pt's appt. With Dr. Darrick Penna is Oct. 2 at 9:00. Also mailed pt. Appt. Card.

## 2012-09-17 NOTE — Progress Notes (Signed)
Quick Note:  Pt returned call and was informed. She said she had gotten the Rx for Vicodin. She does not think she wants any exploratory surgery. She will just see Dr. Darrick Penna back and discuss. Routing to [place] to schedule. ______

## 2012-10-04 ENCOUNTER — Ambulatory Visit (INDEPENDENT_AMBULATORY_CARE_PROVIDER_SITE_OTHER): Payer: BC Managed Care – PPO | Admitting: Gastroenterology

## 2012-10-04 ENCOUNTER — Encounter: Payer: Self-pay | Admitting: Gastroenterology

## 2012-10-04 ENCOUNTER — Other Ambulatory Visit: Payer: Self-pay | Admitting: Gastroenterology

## 2012-10-04 VITALS — BP 134/85 | HR 109 | Temp 98.4°F | Ht 68.0 in | Wt 164.4 lb

## 2012-10-04 DIAGNOSIS — R109 Unspecified abdominal pain: Secondary | ICD-10-CM

## 2012-10-04 DIAGNOSIS — K5900 Constipation, unspecified: Secondary | ICD-10-CM

## 2012-10-04 DIAGNOSIS — R1010 Upper abdominal pain, unspecified: Secondary | ICD-10-CM

## 2012-10-04 DIAGNOSIS — R634 Abnormal weight loss: Secondary | ICD-10-CM

## 2012-10-04 MED ORDER — AMITRIPTYLINE HCL 10 MG PO TABS: ORAL_TABLET | ORAL | Status: DC

## 2012-10-04 NOTE — Assessment & Plan Note (Signed)
SX BETTER CONTROLLED.  DRINK WATER TO KEEP YOUR URINE LIGHT YELLOW. EAT FIBER CONTINUE LINZESS. MIRALAX PRN OPV IN 3 MOS

## 2012-10-04 NOTE — Patient Instructions (Signed)
DRINK WATER TO KEEP YOUR URINE LIGHT YELLOW.  FOLLOW A HIGH FIBER DIET. SEE INFO BELOW.  SEE RHEUMATOLOGY IN [place].  ADD ELAVIL AT BEDTIME. INCREASE DOSE EVERY 3 DAYS. CALL ME WITH SIDE EFFECTS: CONSTIPATION, DROWSINESS, DRY MOUTH, URINARY DIFFICULTY.  CONTINUE LINZESS. MIRALAX AS NEEDED.  FOLLOW UP IN 3 MOS.   High-Fiber Diet A high-fiber diet changes your normal diet to include more whole grains, legumes, fruits, and vegetables. Changes in the diet involve replacing refined carbohydrates with unrefined foods. The calorie level of the diet is essentially unchanged. The Dietary Reference Intake (recommended amount) for adult males is 38 grams per day. For adult females, it is 25 grams per day. Pregnant and lactating women should consume 28 grams of fiber per day. Fiber is the intact part of a plant that is not broken down during digestion. Functional fiber is fiber that has been isolated from the plant to provide a beneficial effect in the body. PURPOSE  Increase stool bulk.   Ease and regulate bowel movements.   Lower cholesterol.  INDICATIONS THAT YOU NEED MORE FIBER  Constipation and hemorrhoids.   Uncomplicated diverticulosis (intestine condition) and irritable bowel syndrome.   Weight management.   As a protective measure against hardening of the arteries (atherosclerosis), diabetes, and cancer.   GUIDELINES FOR INCREASING FIBER IN THE DIET  Start adding fiber to the diet slowly. A gradual increase of about 5 more grams (2 slices of whole-wheat bread, 2 servings of most fruits or vegetables, or 1 bowl of high-fiber cereal) per day is best. Too rapid an increase in fiber may result in constipation, flatulence, and bloating.   Drink enough water and fluids to keep your urine clear or pale yellow. Water, juice, or caffeine-free drinks are recommended. Not drinking enough fluid may cause constipation.   Eat a variety of high-fiber foods rather than one type of fiber.    Try to increase your intake of fiber through using high-fiber foods rather than fiber pills or supplements that contain small amounts of fiber.   The goal is to change the types of food eaten. Do not supplement your present diet with high-fiber foods, but replace foods in your present diet.  INCLUDE A VARIETY OF FIBER SOURCES  Replace refined and processed grains with whole grains, canned fruits with fresh fruits, and incorporate other fiber sources. White rice, white breads, and most bakery goods contain little or no fiber.   Brown whole-grain rice, buckwheat oats, and many fruits and vegetables are all good sources of fiber. These include: broccoli, Brussels sprouts, cabbage, cauliflower, beets, sweet potatoes, white potatoes (skin on), carrots, tomatoes, eggplant, squash, berries, fresh fruits, and dried fruits.   Cereals appear to be the richest source of fiber. Cereal fiber is found in whole grains and bran. Bran is the fiber-rich outer coat of cereal grain, which is largely removed in refining. In whole-grain cereals, the bran remains. In breakfast cereals, the largest amount of fiber is found in those with "bran" in their names. The fiber content is sometimes indicated on the label.   You may need to include additional fruits and vegetables each day.   In baking, for 1 cup white flour, you may use the following substitutions:   1 cup whole-wheat flour minus 2 tablespoons.   1/2 cup white flour plus 1/2 cup whole-wheat flour.

## 2012-10-04 NOTE — Progress Notes (Signed)
  Subjective:    Patient ID: Brianna [name], female    DOB: July 21, 1960, 52 y.o.   MRN: [id number]  Pcp Not In System  HPI STILL HAVING TROUBLE WITH PAIN IN ABDOMEN. STOMACH PROBLEMS SINCE MAR 2014. 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 CAN GET SO BAD THAT SHE HAS CHEST AND ABD PAIN AND IT UNABLE TO LET SHIRT OR CLOTHES TOUCH HER SKIN. GETS COLD SOME TIMES. MAY HAVE PROBLEMS HAVING BM: CONSTIPATION TO WATERY STOOLS. MAY URINATE A LOT. OFF AND ON INDIGESTION. SISTER HAD LUPUS. MILD SOB: 2X/WEEK WHEN SHE CAN'T HAVE A BM. PT DENIES FEVER, CHILLS, BRBPR, nausea, vomiting, melena, problems swallowing, OR  problems with sedation. HAS WATERY STOOL AT LEAST ONCE A WEEK.  Past Medical History  Diagnosis Date  . 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 Partial hysterectomy    . Carpal tunnel release      left  . Colonoscopy with esophagogastroduodenoscopy (egd) N/A 04/02/2012    SLF: SMALL RECTAL polyp/Small internal hemorrhoids. Polyp hyperplastic. On EGD she had mild gastritis. Biopsy showed chronic and active gastritis and no H. pylori. Next colonoscopy in 10 years with overtube/? Propofol   Allergies  Allergen Reactions  . Other Hives and Shortness Of Breath    seafood  . Iodine Hives    shellfish  . Neosporin [Neomycin-Bacitracin Zn-Polymyx] Rash    Current Outpatient Prescriptions  Medication Sig Dispense Refill  . acetaminophen (TYLENOL) 650 MG CR tablet Take 650 mg by mouth every 8 (eight) hours as needed for pain.      Marland Kitchen glipiZIDE (GLUCOTROL) 10 MG tablet Take 10 mg by mouth daily.      Marland Kitchen HYDROcodone-acetaminophen (NORCO/VICODIN) 5-325 MG per tablet Take 1 tablet by mouth every 6 (six) hours as needed for pain.    Marland Kitchen insulin detemir (LEVEMIR) 100 UNIT/ML injection Inject 70 Units into the skin daily.     Marland Kitchen omeprazole (PRILOSEC) 20 MG capsule Take 20 mg once daily FOR THE NEXT YEAR      . polyethylene glycol  (MIRALAX / GLYCOLAX) packet Take 17 g by mouth daily.  USING PRN    . rosuvastatin (CRESTOR) 20 MG tablet Take 20 mg by mouth daily.           Review of Systems     Objective:   Physical Exam        Assessment & Plan:

## 2012-10-04 NOTE — Progress Notes (Signed)
NO PCP

## 2012-10-04 NOTE — Assessment & Plan Note (Signed)
SX BEGAN MAR 2014. EXTENSIVE WORKUP TO INCLUDE GYN EVALUATION DID NOT REVEAL AN ETIOLOGY. CHRONIC ABDOMINAL PAIN ASSOCIATED WITH CHEST AND BACK PAIN. PT HAS SIGNIFICANT PSYCHOSOCIAL STRESSORS.  REFER TO RHEUMATOLOGY ADD ELAVIL QHS CONTINUE LINZESS. MIRALAX PRN. OPV IN 3 MOS

## 2012-10-04 NOTE — Assessment & Plan Note (Signed)
PT'S WEIGHT IS NOW STABLE.  CONTINUE TO MONITOR SYMPTOMS. OPV IN 3 MOS

## 2012-10-08 NOTE — Progress Notes (Signed)
Reminder in epic °

## 2012-10-10 ENCOUNTER — Telehealth: Payer: Self-pay | Admitting: Gastroenterology

## 2012-10-10 NOTE — Telephone Encounter (Signed)
Patient has appointment with Dr. Dareen Piano on 10/23/12 @ 1:30

## 2012-11-15 ENCOUNTER — Telehealth: Payer: Self-pay

## 2012-11-15 NOTE — Telephone Encounter (Signed)
Pt called today to let you know that the Amitriptyline 10 mg did help some but would like to like to know if you could send in a strong does to the [facility] in [place]. Please advise

## 2012-11-21 MED ORDER — AMITRIPTYLINE HCL 25 MG PO TABS: ORAL_TABLET | ORAL | Status: DC

## 2012-11-21 NOTE — Telephone Encounter (Signed)
PLEASE CALL PT. I SENT RX FOR HIGHER DOSE. HER TABLETS ARE 25 mg tabs. She may take five of the 10 mg tabs until they run out then start TWO 25 mg tablets at bedtime. CALL WITH QUESTIONS OR CONCERNS.

## 2012-11-22 NOTE — Telephone Encounter (Signed)
Routing to [employer] is on vacation this week

## 2012-11-22 NOTE — Telephone Encounter (Signed)
LM with someone to have pt call.

## 2012-11-26 NOTE — Telephone Encounter (Signed)
Pt returned call and was informed.  

## 2012-12-04 ENCOUNTER — Telehealth: Payer: Self-pay

## 2012-12-04 NOTE — Telephone Encounter (Signed)
REVIEWED.  

## 2012-12-04 NOTE — Telephone Encounter (Signed)
Pt left VM that her medication ( Elavil ) Rx had not been sent to the pharmacy. I called [facility], [place] and spoke to [employer] and gave a verbal order. ( Looks like Rx just didn't go when Dr. Darrick Penna attempted to send on 11/21/2012). Called in the Elavil 25 mg #60 and sig is take two tablets PO qhs, 11 refills.

## 2013-01-04 ENCOUNTER — Encounter (HOSPITAL_COMMUNITY): Payer: Self-pay | Admitting: Emergency Medicine

## 2013-01-04 ENCOUNTER — Emergency Department (HOSPITAL_COMMUNITY)
Admission: EM | Admit: 2013-01-04 | Discharge: 2013-01-04 | Disposition: A | Discharge status: Home / Self Care | Payer: BC Managed Care – PPO | Attending: Emergency Medicine | Admitting: Emergency Medicine

## 2013-01-04 DIAGNOSIS — H44009 Unspecified purulent endophthalmitis, unspecified eye: Secondary | ICD-10-CM | POA: Insufficient documentation

## 2013-01-04 DIAGNOSIS — Z794 Long term (current) use of insulin: Secondary | ICD-10-CM | POA: Insufficient documentation

## 2013-01-04 DIAGNOSIS — E119 Type 2 diabetes mellitus without complications: Secondary | ICD-10-CM | POA: Insufficient documentation

## 2013-01-04 DIAGNOSIS — Z79899 Other long term (current) drug therapy: Secondary | ICD-10-CM | POA: Insufficient documentation

## 2013-01-04 DIAGNOSIS — H109 Unspecified conjunctivitis: Secondary | ICD-10-CM | POA: Insufficient documentation

## 2013-01-04 DIAGNOSIS — L03213 Periorbital cellulitis: Secondary | ICD-10-CM

## 2013-01-04 MED ORDER — TETRACAINE HCL 0.5 % OP SOLN
1.0000 [drp] | Freq: Once | OPHTHALMIC | Status: AC
Start: 2013-01-04 — End: 2013-01-04
  Administered 2013-01-04: 1 [drp] via OPHTHALMIC
  Filled 2013-01-04: qty 2

## 2013-01-04 MED ORDER — SULFAMETHOXAZOLE-TRIMETHOPRIM 800-160 MG PO TABS: 1.0000 | ORAL_TABLET | Freq: Two times a day (BID) | ORAL | Status: DC

## 2013-01-04 MED ORDER — FLUORESCEIN SODIUM 1 MG OP STRP
1.0000 | ORAL_STRIP | Freq: Once | OPHTHALMIC | Status: AC
  Administered 2013-01-04: 1 via OPHTHALMIC
  Filled 2013-01-04: qty 1

## 2013-01-04 MED ORDER — CIPROFLOXACIN HCL 0.3 % OP SOLN: 1.0000 [drp] | OPHTHALMIC | Status: DC

## 2013-01-04 NOTE — Discharge Instructions (Signed)
Conjunctivitis Conjunctivitis is commonly called "pink eye." Conjunctivitis can be caused by bacterial or viral infection, allergies, or injuries. There is usually redness of the lining of the eye, itching, discomfort, and sometimes discharge. There may be deposits of matter along the eyelids. A viral infection usually causes a watery discharge, while a bacterial infection causes a yellowish, thick discharge. Pink eye is very contagious and spreads by direct contact. You may be given antibiotic eyedrops as part of your treatment. Before using your eye medicine, remove all drainage from the eye by washing gently with warm water and cotton balls. Continue to use the medication until you have awakened 2 mornings in a row without discharge from the eye. Do not rub your eye. This increases the irritation and helps spread infection. Use separate towels from other household members. Wash your hands with soap and water before and after touching your eyes. Use cold compresses to reduce pain and sunglasses to relieve irritation from light. Do not wear contact lenses or wear eye makeup until the infection is gone. SEEK MEDICAL CARE IF:   Your symptoms are not better after 3 days of treatment.  You have increased pain or trouble seeing.  The outer eyelids become very red or swollen. Document Released: 01/28/2004 Document Revised: 03/14/2011 Document Reviewed: 12/20/2004  [facility] Patient Information 2014 [place]. Cellulitis Cellulitis is an infection of the skin and the tissue beneath it. The infected area is usually red and tender. Cellulitis occurs most often in the arms and lower legs.  CAUSES  Cellulitis is caused by bacteria that enter the skin through cracks or cuts in the skin. The most common types of bacteria that cause cellulitis are Staphylococcus and Streptococcus. SYMPTOMS   Redness and warmth.  Swelling.  Tenderness or pain.  Fever. DIAGNOSIS  Your caregiver can usually determine  what is wrong based on a physical exam. Blood tests may also be done. TREATMENT  Treatment usually involves taking an antibiotic medicine. HOME CARE INSTRUCTIONS   Take your antibiotics as directed. Finish them even if you start to feel better.  Keep the infected arm or leg elevated to reduce swelling.  Apply a warm cloth to the affected area up to 4 times per day to relieve pain.  Only take over-the-counter or prescription medicines for pain, discomfort, or fever as directed by your caregiver.  Keep all follow-up appointments as directed by your caregiver. SEEK MEDICAL CARE IF:   You notice red streaks coming from the infected area.  Your red area gets larger or turns dark in color.  Your bone or joint underneath the infected area becomes painful after the skin has healed.  Your infection returns in the same area or another area.  You notice a swollen bump in the infected area.  You develop new symptoms. SEEK IMMEDIATE MEDICAL CARE IF:   You have a fever.  You feel very sleepy.  You develop vomiting or diarrhea.  You have a general ill feeling (malaise) with muscle aches and pains. MAKE SURE YOU:   Understand these instructions.  Will watch your condition.  Will get help right away if you are not doing well or get worse. Document Released: 09/29/2004 Document Revised: 06/21/2011 Document Reviewed: 03/07/2011 [facility] Patient Information 2014 [place].

## 2013-01-04 NOTE — ED Provider Notes (Signed)
Medical screening examination/treatment/procedure(s) were conducted as a shared visit with non-physician practitioner(s) and myself.  I personally evaluated the patient during the encounter.  EKG Interpretation   None      No evidence of a corneal abrasion. Rx antibiotic drops and Bactrim po  Nat Christen, MD 01/04/13 1407

## 2013-01-04 NOTE — ED Provider Notes (Signed)
CSN: [id number]     Arrival date & time 01/04/13  0736 History   First MD Initiated Contact with Patient 01/04/13 [phone]     Chief Complaint  Patient presents with  . Eye Problem    lt   (Consider location/radiation/quality/duration/timing/severity/associated sxs/prior Treatment) HPI Comments: Patient presents to the ED with a chief complaint of left eye irritation.  She states that the eye has been bothering her since Dec. 9th.  She states that she was treated in [place] for the same.  She has tried steroids with no relief.  She states that the eye feels like there is something in it, but she denies getting anything in it.  She states that in the morning she will find crusty discharge.  She is diabetic.  The history is provided by the patient. No language interpreter was used.    Past Medical History  Diagnosis Date  . Diabetes mellitus without complication     insulin dependent   Past Surgical History  Procedure Laterality Date  . Partial hysterectomy    . Carpal tunnel release      left  . Colonoscopy with esophagogastroduodenoscopy (egd) N/A 04/02/2012    SLF: SMALL RECTAL polyp/Small internal hemorrhoids. Polyp hyperplastic. On EGD she had mild gastritis. Biopsy showed chronic and active gastritis and no H. pylori. Next colonoscopy in 10 years with overtube/? Propofol   Family History  Problem Relation Age of Onset  . Lupus Sister   . Stroke Brother   . Diabetes Father   . Stroke Mother    History  Substance Use Topics  . Smoking status: Never Smoker   . Smokeless tobacco: Not on file  . Alcohol Use: No   OB History   Grav Para Term Preterm Abortions TAB SAB Ect Mult Living                 Review of Systems  All other systems reviewed and are negative.    Allergies  Other; Iodine; and Neosporin  Home Medications   Current Outpatient Rx  Name  Route  Sig  Dispense  Refill  . acetaminophen (TYLENOL) 650 MG CR tablet   Oral   Take 650 mg by mouth every 8  (eight) hours as needed for pain.         Marland Kitchen amitriptyline (ELAVIL) 25 MG tablet      2 po qhs   60 tablet   11   . glipiZIDE (GLUCOTROL) 10 MG tablet   Oral   Take 10 mg by mouth daily.         Marland Kitchen HYDROcodone-acetaminophen (NORCO/VICODIN) 5-325 MG per tablet   Oral   Take 1 tablet by mouth every 6 (six) hours as needed for pain.   20 tablet   0   . insulin detemir (LEVEMIR) 100 UNIT/ML injection   Subcutaneous   Inject 70 Units into the skin daily.          . Linaclotide (LINZESS) 145 MCG CAPS capsule   Oral   Take 145 mcg by mouth daily.         Marland Kitchen omeprazole (PRILOSEC) 20 MG capsule   Oral   Take 20 mg by mouth 2 (two) times daily. 1 po bid 30 minutes before meals for 3 mos then once daily FOR THE NEXT YEAR         . polyethylene glycol (MIRALAX / GLYCOLAX) packet   Oral   Take 17 g by mouth daily.         Marland Kitchen  rosuvastatin (CRESTOR) 20 MG tablet   Oral   Take 20 mg by mouth daily.          BP 123/72  Pulse 110  Temp(Src) 97.3 F (36.3 C) (Oral)  Resp 21  Ht 5\' 6"  (1.676 m)  Wt 158 lb (71.668 kg)  BMI 25.51 kg/m2  SpO2 99% Physical Exam  Nursing note and vitals reviewed. Constitutional: She is oriented to person, place, and time. She appears well-developed and well-nourished.  HENT:  Head: Normocephalic and atraumatic.  Mild preseptal cellulitis, no pain with eye movement  Eyes: Conjunctivae and EOM are normal.  No fluorescein uptake to indicate corneal abrasion, eye pressure is 12 in the left and 16 in the right, no foreign body seen on slit lamp, no pain with eye movement or any indication of entrapment  Neck: Normal range of motion.  Cardiovascular: Normal rate.   Pulmonary/Chest: Effort normal.  Abdominal: She exhibits no distension.  Musculoskeletal: Normal range of motion.  Neurological: She is alert and oriented to person, place, and time.  Skin: Skin is dry.  Psychiatric: She has a normal mood and affect. Her behavior is normal.  Judgment and thought content normal.    ED Course  Procedures (including critical care time) Labs Review Labs Reviewed - No data to display Imaging Review No results found.  EKG Interpretation   None       MDM   1. Conjunctivitis   2. Preseptal cellulitis of left eye     Patient with conjunctivitis and probable cellulitis.  Nothing to suggest deep infection.  Normal pressures.  Visual acuity WNL, and no reported change.  Normal wood's lamp and slit lamp exam.  Will discharge with oral and opthalmic antibiotics.  I discussed the patient with Dr. Lacinda Axon, who agrees with the plan.  Opthalmology follow-up is recommended in 2 days if symptoms do not improve.    Montine Circle, PA-C 01/04/13 [phone]

## 2013-01-04 NOTE — ED Notes (Signed)
Pt has had ongoing swelling of lt eye with redness since Dec 9th, episodes of less swelling, treated in [facility] for same, given steroids, not any better. Lt eye swollen, Sclera ren.

## 2014-02-11 ENCOUNTER — Encounter: Payer: Self-pay | Admitting: Gastroenterology

## 2014-02-11 ENCOUNTER — Other Ambulatory Visit: Payer: Self-pay

## 2014-02-11 ENCOUNTER — Telehealth: Payer: Self-pay | Admitting: Gastroenterology

## 2014-02-11 MED ORDER — AMITRIPTYLINE HCL 25 MG PO TABS: ORAL_TABLET | ORAL | Status: DC

## 2014-02-11 NOTE — Telephone Encounter (Signed)
I refilled Elavil for 1 month. Needs f/u before any further refills.

## 2014-02-11 NOTE — Telephone Encounter (Signed)
APPOINTMENT MADE AND LETTER SENT °

## 2014-02-25 ENCOUNTER — Other Ambulatory Visit: Payer: Self-pay

## 2014-03-05 ENCOUNTER — Ambulatory Visit: Payer: Self-pay | Admitting: Gastroenterology

## 2014-03-17 ENCOUNTER — Ambulatory Visit (INDEPENDENT_AMBULATORY_CARE_PROVIDER_SITE_OTHER): Payer: BLUE CROSS/BLUE SHIELD | Admitting: Diagnostic Neuroimaging

## 2014-03-17 ENCOUNTER — Encounter: Payer: Self-pay | Admitting: Diagnostic Neuroimaging

## 2014-03-17 VITALS — BP 100/62 | HR 126 | Ht 67.5 in | Wt 145.8 lb

## 2014-03-17 DIAGNOSIS — G541 Lumbosacral plexus disorders: Secondary | ICD-10-CM

## 2014-03-17 DIAGNOSIS — E1144 Type 2 diabetes mellitus with diabetic amyotrophy: Secondary | ICD-10-CM

## 2014-03-17 MED ORDER — AMITRIPTYLINE HCL 25 MG PO TABS: 50.0000 mg | ORAL_TABLET | Freq: Every day | ORAL | Status: DC

## 2014-03-17 MED ORDER — GABAPENTIN 300 MG PO CAPS: 300.0000 mg | ORAL_CAPSULE | Freq: Three times a day (TID) | ORAL | Status: DC

## 2014-03-17 NOTE — Patient Instructions (Signed)
Increase gabapentin and amitriptyline as discussed.

## 2014-03-17 NOTE — Progress Notes (Signed)
[facility]  PATIENT: Brianna [name] DOB: 1960/01/11  REFERRING CLINICIAN: Sherri Sear, MD HISTORY FROM: patient (and EPIC chart review, referring note review) REASON FOR VISIT: new consult    HISTORICAL  CHIEF COMPLAINT:  Chief Complaint  Patient presents with  . New Evaluation    meralgia paresthesia     HISTORY OF PRESENT ILLNESS:   54 year old right-handed female here for consultation from Dr. Megan Salon, regarding right leg numbness and pain. 2 months ago patient had sudden onset of burning, pain, soreness in her right hip radiating to her right inner thigh, anterior thigh, lateral thigh, without numbness or pain below her right knee. No symptoms in the left leg. No symptoms in the bilateral hands. Patient has uncontrolled diabetes with morning sugars averaging from 200-300.   Patient reports and review of referring notes shows the following: Patient has been tried on amitriptyline 25 mg at bedtime and gabapentin 300 mg at bedtime without relief. She also is on hydrocodone and meloxicam. Patient was treated with nerve block injection for possible meralgia paresthetica, without relief. She had MRI of lumbar spine which is unremarkable except for mild degenerative changes.   REVIEW OF SYSTEMS: Full 14 system review of systems performed and notable only for restless legs not enough sleep change in appetite joint pain cramps flushing weight loss.  ALLERGIES: Allergies  Allergen Reactions  . Other Hives and Shortness Of Breath    seafood  . Iodine Hives    shellfish  . Neosporin [Neomycin-Bacitracin Zn-Polymyx] Rash    HOME MEDICATIONS: Outpatient Prescriptions Prior to Visit  Medication Sig Dispense Refill  . HYDROcodone-acetaminophen (NORCO/VICODIN) 5-325 MG per tablet Take 1 tablet by mouth every 6 (six) hours as needed for pain. 20 tablet 0  . omeprazole (PRILOSEC) 20 MG capsule Take 20 mg by mouth 2 (two) times daily. 1 po bid 30 minutes before  meals for 3 mos then once daily FOR THE NEXT YEAR    . polyethylene glycol (MIRALAX / GLYCOLAX) packet Take 17 g by mouth daily.    . rosuvastatin (CRESTOR) 20 MG tablet Take 20 mg by mouth daily.    Marland Kitchen amitriptyline (ELAVIL) 25 MG tablet 2 po qhs 60 tablet 0  . acetaminophen (TYLENOL) 650 MG CR tablet Take 650 mg by mouth every 8 (eight) hours as needed for pain.    . ciprofloxacin (CILOXAN) 0.3 % ophthalmic solution Place 1 drop into both eyes every 2 (two) hours. Administer 1 drop, every 2 hours, while awake, for 2 days. Then 1 drop, every 4 hours, while awake, for the next 5 days. 5 mL 0  . glipiZIDE (GLUCOTROL) 10 MG tablet Take 10 mg by mouth daily.    . insulin detemir (LEVEMIR) 100 UNIT/ML injection Inject 70 Units into the skin daily.     . Linaclotide (LINZESS) 145 MCG CAPS capsule Take 145 mcg by mouth daily.    Marland Kitchen sulfamethoxazole-trimethoprim (SEPTRA DS) 800-160 MG per tablet Take 1 tablet by mouth every 12 (twelve) hours. 20 tablet 0   No facility-administered medications prior to visit.    PAST MEDICAL HISTORY: Past Medical History  Diagnosis Date  . Diabetes mellitus without complication     insulin dependent    PAST SURGICAL HISTORY: Past Surgical History  Procedure Laterality Date  . Partial hysterectomy    . Carpal tunnel release      left  . Colonoscopy with esophagogastroduodenoscopy (egd) N/A 04/02/2012    SLF: SMALL RECTAL polyp/Small internal hemorrhoids. Polyp hyperplastic. On EGD  she had mild gastritis. Biopsy showed chronic and active gastritis and no H. pylori. Next colonoscopy in 10 years with overtube/? Propofol    FAMILY HISTORY: Family History  Problem Relation Age of Onset  . Lupus Sister   . Stroke Brother   . Diabetes Father   . Stroke Mother     SOCIAL HISTORY:  History   Social History  . Marital Status: Married    Spouse Name: N/A  . Number of Children: 2  . Years of Education: N/A   Occupational History  . 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 Smoking status: Never Smoker   . Smokeless tobacco: Not on file  . Alcohol Use: No  . Drug Use: No  . Sexual Activity: Not on file   Other Topics Concern  . Not on file   Social History Narrative   Lives w/ husband 2 kids-17/20 healthy     PHYSICAL EXAM  Filed Vitals:   03/17/14 1419  BP: 100/62  Pulse: 126  Height: 5' 7.5" (1.715 m)  Weight: 145 lb 12.8 oz (66.134 kg)    Body mass index is 22.49 kg/(m^2).   Visual Acuity Screening   Right eye Left eye Both eyes  Without correction: 20/30 20/50   With correction:       No flowsheet data found.  GENERAL EXAM: Patient is in no distress; well developed, nourished and groomed; neck is supple  CARDIOVASCULAR: Regular rate and rhythm, no murmurs, no carotid bruits  NEUROLOGIC: MENTAL STATUS: awake, alert, oriented to person, place and time, recent and remote memory intact, normal attention and concentration, language fluent, comprehension intact, naming intact, fund of knowledge appropriate CRANIAL NERVE: no papilledema on fundoscopic exam, pupils equal and reactive to light, visual fields full to confrontation, extraocular muscles intact, no nystagmus, facial sensation and strength symmetric, hearing intact, palate elevates symmetrically, uvula midline, shoulder shrug symmetric, tongue midline. MOTOR: normal bulk and tone, full strength in the BUE, LLE; RLE HF 4, KE/KF 4, DF 4 (WITH PAIN) SENSORY: normal and symmetric to light touch, pinprick, temperature; DECR VIB AT TOES (6 SEC); HYPERESTESIA / ALLODYNIA IN RIGHT LEG/THIGH COORDINATION: finger-nose-finger, fine finger movements normal REFLEXES: deep tendon reflexes present and symmetric; ABSENT AT ANKLES GAIT/STATION: narrow based gait; romberg is negative    DIAGNOSTIC DATA (LABS, IMAGING, TESTING) - I reviewed patient records, labs, notes, testing and imaging myself where available.  Lab Results  Component  Value Date   WBC 5.5 08/27/2012   HGB 14.9 08/27/2012   HCT 42.1 08/27/2012   MCV 85.1 08/27/2012   PLT 280 08/27/2012      Component Value Date/Time   NA 137 08/27/2012 1310   NA 141 02/27/2012   K 4.0 08/27/2012 1310   K 4.1 02/27/2012   CL 100 08/27/2012 1310   CL 26 02/27/2012   CO2 25 08/27/2012 1310   GLUCOSE 296* 08/27/2012 1310   BUN 12 08/27/2012 1310   BUN 11 02/27/2012   CREATININE 0.62 08/27/2012 1310   CALCIUM 9.9 08/27/2012 1310   CALCIUM 9.4 02/27/2012   PROT 7.2 08/27/2012 1310   PROT 6.7 02/27/2012   ALBUMIN 4.4 08/27/2012 1310   AST 11 08/27/2012 1310   AST 11 02/27/2012   ALT 13 08/27/2012 1310   ALKPHOS 91 08/27/2012 1310   ALKPHOS 103 02/27/2012   BILITOT 0.6 08/27/2012 1310   BILITOT 0.2 02/27/2012   No results found for: CHOL, HDL, LDLCALC, LDLDIRECT,  TRIG, CHOLHDL Lab Results  Component Value Date   HGBA1C 10.0* 02/27/2012   No results found for: VITAMINB12 Lab Results  Component Value Date   TSH 1.278 08/27/2012    02/27/14 MRI LUMBAR SPINE - Mild degenerative disc and facet arthropathy at L3-4 and L4-5. No evidence of disc herniation.   ASSESSMENT AND PLAN  54 y.o. year old female here with new onset right hip, thigh, leg burning and numbness sensation starting in January 2016, in setting of uncontrolled diabetes. Neuro exam shows motor and sensory deficits in the right proximal leg, suggestive of plexopathy. MRI L-spine unremarkable. 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   Dx: diabetic lumbosacral plexopathy  PLAN: - increase gabapentin and amitriptyline - emphasized importance of reducing sugar levels and improving diabetes control  Meds ordered this encounter  Medications  . amitriptyline (ELAVIL) 25 MG tablet    Sig: Take 2 tablets (50 mg total) by mouth at bedtime.    Dispense:  60 tablet    Refill:  6  . gabapentin (NEURONTIN) 300 MG capsule    Sig: Take 1 capsule (300 mg total) by mouth 3 (three) times daily.     Dispense:  90 capsule    Refill:  6   Return in about 3 months (around 06/17/2014).    Penni Bombard, MD 3/49/1791, 5:05 PM Certified in Neurology, Neurophysiology and Neuroimaging  [facility] [address] [place], [place] [address] [phone]

## 2014-04-02 ENCOUNTER — Encounter: Payer: Self-pay | Admitting: Gastroenterology

## 2014-04-02 ENCOUNTER — Ambulatory Visit (INDEPENDENT_AMBULATORY_CARE_PROVIDER_SITE_OTHER): Payer: BLUE CROSS/BLUE SHIELD | Admitting: Gastroenterology

## 2014-04-02 VITALS — BP 115/69 | HR 121 | Temp 98.6°F | Ht 68.0 in | Wt 147.6 lb

## 2014-04-02 DIAGNOSIS — R6881 Early satiety: Secondary | ICD-10-CM | POA: Diagnosis not present

## 2014-04-02 DIAGNOSIS — R109 Unspecified abdominal pain: Secondary | ICD-10-CM

## 2014-04-02 DIAGNOSIS — R634 Abnormal weight loss: Secondary | ICD-10-CM | POA: Diagnosis not present

## 2014-04-02 NOTE — Assessment & Plan Note (Signed)
Ongoing weight loss, documented tachycardia on multiple occasions, early satiety and anorexia. Need to rule out thyroid problem. She describes poorly controlled DM, I do not have her last HgbA1C.Cannot exclude gastroparesis. Last EGD 2014 with gastritis.  Labs have been requested. Await labs, further recommendations to follow.

## 2014-04-02 NOTE — Patient Instructions (Addendum)
1. I will review your recent labs done by Dr. Morton Stall. 2. Please have labs done to check your thyroid. 3. You have a prescription at [facility] in [place] for amitriptyline. You need to call them and have it filled and pick it up.  4. Further recommendations to follow.

## 2014-04-02 NOTE — Progress Notes (Signed)
Primary Care Physician: Terrial Rhodes, MD  Primary Gastroenterologist:  Barney Drain, MD   Chief Complaint  Patient presents with  . Follow-up    HPI: Brianna [name] is a 54 y.o. female here for follow up. She was last seen in 10/2012. H/o abdominal pain which has been managed with Elavil 50mg  at bedtime. Abdominal pain well-controlled with this regimen. Before she couldn't stand her shirt touching her skin. Now pain free. Complains of ongoing weight loss. No appetite. Worse now. No N/V. Early satiety. Water/juices. BM most days. Rare straining. miralax on occasion. Some brbpr with straining at times. No melena. On Mobic for several months for pain in the leg. Neurologist is treating her for diabetic plexopathy with neurontin and continued Elavil 50mg  at bedtime. She reports poorly controlled DM. Denies heartburn.  Wt Readings from Last 3 Encounters:  04/02/14 147 lb 9.6 oz (66.951 kg)  03/17/14 145 lb 12.8 oz (66.134 kg)  01/04/13 158 lb (71.668 kg)  10/2012  164 03/2012 180   Pulse Readings from Last 3 Encounters:  04/02/14 121  03/17/14 126  01/04/13 110     Current Outpatient Prescriptions  Medication Sig Dispense Refill  . amitriptyline (ELAVIL) 25 MG tablet Take 2 tablets (50 mg total) by mouth at bedtime. 60 tablet 6  . gabapentin (NEURONTIN) 300 MG capsule Take 1 capsule (300 mg total) by mouth 3 (three) times daily. 90 capsule 6  . HYDROcodone-acetaminophen (NORCO/VICODIN) 5-325 MG per tablet Take 1 tablet by mouth every 6 (six) hours as needed for pain. 20 tablet 0  . insulin aspart (NOVOLOG) 100 UNIT/ML injection Inject 10 Units into the skin 3 (three) times daily before meals. At breakfast, lunch and dinner    . meloxicam (MOBIC) 15 MG tablet Take 15 mg by mouth daily. with food  1  . omeprazole (PRILOSEC) 20 MG capsule Take 20 mg by mouth daily. 1 po bid 30 minutes before meals for 3 mos then once daily FOR THE NEXT YEAR    . ONE TOUCH ULTRA TEST  test strip   11  . polyethylene glycol (MIRALAX / GLYCOLAX) packet Take 17 g by mouth as needed.      No current facility-administered medications for this visit.    Allergies as of 04/02/2014 - Review Complete 04/02/2014  Allergen Reaction Noted  . Other Hives and Shortness Of Breath 03/03/2012  . Iodine Hives 03/03/2012  . Neosporin [neomycin-bacitracin zn-polymyx] Rash 03/03/2012   Past Medical History  Diagnosis Date  . Diabetes mellitus without complication     insulin dependent   Past Surgical History  Procedure Laterality Date  . Partial hysterectomy    . Carpal tunnel release      left  . Colonoscopy with esophagogastroduodenoscopy (egd) N/A 04/02/2012    SLF: SMALL RECTAL polyp/Small internal hemorrhoids. Polyp hyperplastic. On EGD she had mild gastritis. Biopsy showed chronic and active gastritis and no H. pylori. Next colonoscopy in 10 years with overtube/? Propofol  . Esophagogastroduodenoscopy  03/2012    SLF: gastritis   Family History  Problem Relation Age of Onset  . Lupus Sister   . Stroke Brother   . Diabetes Father   . Stroke Mother    History   Social History  . Marital Status: Married    Spouse Name: N/A  . Number of Children: 2  . 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  Smoking status: Never Smoker   . Smokeless tobacco: Not on file  . Alcohol Use: No  . Drug Use: No  . Sexual Activity: Not on file   Other Topics Concern  . None   Social History Narrative   Lives w/ husband 2 kids-17/20 healthy     ROS:  General: Negative for , fever, chills, fatigue, weakness.see hpi ENT: Negative for hoarseness, difficulty swallowing , nasal congestion. CV: Negative for chest pain, angina, palpitations, dyspnea on exertion, peripheral edema.  Respiratory: Negative for dyspnea at rest, dyspnea on exertion, cough, sputum, wheezing.  GI: See history of present  illness. GU:  Negative for dysuria, hematuria, urinary incontinence, urinary frequency, nocturnal urination.  Endo: see hpi   Physical Examination:   BP 115/69 mmHg  Pulse 121  Temp(Src) 98.6 F (37 C)  Ht 5\' 8"  (1.727 m)  Wt 147 lb 9.6 oz (66.951 kg)  BMI 22.45 kg/m2  General: Well-nourished, well-developed in no acute distress.  Eyes: No icterus. Mouth: Oropharyngeal mucosa moist and pink , no lesions erythema or exudate. Lungs: Clear to auscultation bilaterally.  Heart: Regular rate and rhythm, no murmurs rubs or gallops.  Abdomen: Bowel sounds are normal, nontender, nondistended, no hepatosplenomegaly or masses, no abdominal bruits or hernia , no rebound or guarding.   Extremities: No lower extremity edema. No clubbing or deformities. Neuro: Alert and oriented x 4   Skin: Warm and dry, no jaundice.   Psych: Alert and cooperative, normal mood and affect.   Imaging Studies: No results found.

## 2014-04-03 LAB — T4, FREE: Free T4: 1.03 ng/dL (ref 0.80–1.80)

## 2014-04-03 LAB — TSH: TSH: 1.671 u[IU]/mL (ref 0.350–4.500)

## 2014-04-03 NOTE — Progress Notes (Signed)
cc'ed to pcp °

## 2014-04-08 ENCOUNTER — Telehealth: Payer: Self-pay | Admitting: Gastroenterology

## 2014-04-08 NOTE — Telephone Encounter (Signed)
Pt called to see if her lab results were back. She had them done Thursday last week. I told her that they probably weren't back that soon, but I would send a note for the nurse to call her if they were available. [phone]

## 2014-04-09 NOTE — Progress Notes (Signed)
Quick Note:  Please tell her the thyroid labs were normal. Due to chronic tachycardia (elevated heart rate) I would like for her to see the cardiologist.  For weight loss, I still need to review her labs from Dr. Morton Stall. Further recommendations to follow. ______

## 2014-04-09 NOTE — Telephone Encounter (Signed)
Pt is calling to see about her lab work. Please advise

## 2014-04-09 NOTE — Telephone Encounter (Signed)
See result note.  

## 2014-04-10 ENCOUNTER — Other Ambulatory Visit: Payer: Self-pay

## 2014-04-10 DIAGNOSIS — R000 Tachycardia, unspecified: Secondary | ICD-10-CM

## 2014-04-10 NOTE — Progress Notes (Signed)
Quick Note:  PT is aware. OK to refer to cardiologist. ______

## 2014-04-14 NOTE — Progress Notes (Signed)
Quick Note:  Reviewed labs from 03/19/2014, Dr. Morton Stall. TSH 3.54, white blood cell count 7000, hemoglobin 13.5, hematocrit 40.4, platelets 337,000, BUN 16, creatinine 0.5, total bilirubin 0.3, alkaline phosphatase 160 high, AST 8, ALT 16, albumin 3.6, vitamin B12 681, vitamin D less than 13, C-peptide 0.8 low Hemoglobin A1c greater than 15%  Poorly controlled diabetes noted. Elevated alkphos noted. Vit D def  She needs the following: -Repeat LFTs, GGT. -gastric emptying study for chronic nausea, anorexia, weight loss. -did she get treated for vit D deficiency? ______

## 2014-04-15 ENCOUNTER — Other Ambulatory Visit: Payer: Self-pay

## 2014-04-15 DIAGNOSIS — R748 Abnormal levels of other serum enzymes: Secondary | ICD-10-CM

## 2014-04-15 NOTE — Progress Notes (Signed)
Quick Note:  Pt is aware. She will go to [place] for the labs.She is taking Vit D, just not sure how much. Ok to schedule the GES. ______

## 2014-04-15 NOTE — Progress Notes (Signed)
Quick Note:  Lab order faxed to [facility]. ______

## 2014-04-16 ENCOUNTER — Other Ambulatory Visit: Payer: Self-pay

## 2014-04-16 DIAGNOSIS — F5000 Anorexia nervosa, unspecified: Secondary | ICD-10-CM

## 2014-04-16 DIAGNOSIS — R110 Nausea: Secondary | ICD-10-CM

## 2014-04-16 DIAGNOSIS — R634 Abnormal weight loss: Secondary | ICD-10-CM

## 2014-04-16 NOTE — Progress Notes (Unsigned)
No PA is needed

## 2014-04-17 LAB — HEPATIC FUNCTION PANEL
ALT: 8 U/L (ref 0–35)
AST: 9 U/L (ref 0–37)
Albumin: 3.6 g/dL (ref 3.5–5.2)
Alkaline Phosphatase: 133 U/L — ABNORMAL HIGH (ref 39–117)
Bilirubin, Direct: <0.1 mg/dL (ref 0.0–0.3)
Total Bilirubin: 0.2 mg/dL (ref 0.2–1.2)
Total Protein: 6.6 g/dL (ref 6.0–8.3)

## 2014-04-17 LAB — GAMMA GT: GGT: 26 U/L (ref 7–51)

## 2014-04-21 ENCOUNTER — Ambulatory Visit (INDEPENDENT_AMBULATORY_CARE_PROVIDER_SITE_OTHER): Payer: BLUE CROSS/BLUE SHIELD | Admitting: Cardiology

## 2014-04-21 ENCOUNTER — Encounter (HOSPITAL_COMMUNITY)
Admission: RE | Admit: 2014-04-21 | Discharge: 2014-04-21 | Disposition: A | Discharge status: Home / Self Care | Payer: BLUE CROSS/BLUE SHIELD | Source: Ambulatory Visit | Attending: Gastroenterology | Admitting: Gastroenterology

## 2014-04-21 ENCOUNTER — Encounter (HOSPITAL_COMMUNITY): Payer: Self-pay

## 2014-04-21 ENCOUNTER — Encounter: Payer: Self-pay | Admitting: Cardiology

## 2014-04-21 VITALS — BP 130/80 | HR 116 | Ht 68.0 in | Wt 149.0 lb

## 2014-04-21 DIAGNOSIS — I471 Supraventricular tachycardia: Secondary | ICD-10-CM | POA: Diagnosis not present

## 2014-04-21 DIAGNOSIS — E1140 Type 2 diabetes mellitus with diabetic neuropathy, unspecified: Secondary | ICD-10-CM

## 2014-04-21 DIAGNOSIS — R634 Abnormal weight loss: Secondary | ICD-10-CM | POA: Insufficient documentation

## 2014-04-21 DIAGNOSIS — R0602 Shortness of breath: Secondary | ICD-10-CM | POA: Diagnosis not present

## 2014-04-21 DIAGNOSIS — R000 Tachycardia, unspecified: Secondary | ICD-10-CM

## 2014-04-21 DIAGNOSIS — F5000 Anorexia nervosa, unspecified: Secondary | ICD-10-CM | POA: Insufficient documentation

## 2014-04-21 DIAGNOSIS — IMO0002 Reserved for concepts with insufficient information to code with codable children: Secondary | ICD-10-CM

## 2014-04-21 DIAGNOSIS — E1165 Type 2 diabetes mellitus with hyperglycemia: Secondary | ICD-10-CM

## 2014-04-21 DIAGNOSIS — R110 Nausea: Secondary | ICD-10-CM | POA: Insufficient documentation

## 2014-04-21 MED ORDER — TECHNETIUM TC 99M SULFUR COLLOID
2.0000 | Freq: Once | INTRAVENOUS | Status: AC | PRN
  Administered 2014-04-21: 2 via ORAL

## 2014-04-21 NOTE — Progress Notes (Signed)
Cardiology Office Note  Date: 04/21/2014   ID: Brianna [name], DOB 10/18/1960, MRN [id number]  PCP: Terrial Rhodes, MD  Primary Cardiologist: Rozann Lesches, MD   Chief Complaint  Patient presents with  . Tachycardia    History of Present Illness: Brianna [name] is a 54 y.o. female referred for cardiology consultation by Ms. Bernarda Caffey for evaluation of tachycardia. Primary care physician is Dr. Morton Stall. I reviewed her recent office visit with gastroenterology in March as well as the subsequent lab work.  She is referred with findings of rapid heart rate on routine exams, although not specifically palpitations. This looks to be fairly long-standing. In reviewing the chart, I see that she has been documented to be tachycardic going back to 2014 on office visits. She states that she has noticed some shortness of breath with activity over the last few months, although is able to maintain her typical routine and ADLs. She does not endorse any sudden dizziness or syncope, no exertional chest pain. She is not aware of any diagnosis of cardiomyopathy or cardiac arrhythmias.  I reviewed the lab work outlined below, thyroid status normal, not anemic, normal LFTs and renal function. Diabetes however has not been controlled with hemoglobin A1c of 15%. ECG done today showed sinus tachycardia with possible biatrial enlargement and nonspecific T-wave changes.   Past Medical History  Diagnosis Date  . Type 2 diabetes mellitus with diabetic neuropathy   . Chronic abdominal pain   . Heartburn   . Gastritis     Past Surgical History  Procedure Laterality Date  . Partial hysterectomy    . Carpal tunnel release      left  . Colonoscopy with esophagogastroduodenoscopy (egd) N/A 04/02/2012    SLF: SMALL RECTAL polyp/Small internal hemorrhoids. Polyp hyperplastic. On EGD she had mild gastritis. Biopsy showed chronic and active gastritis and no H. pylori. Next colonoscopy in 10 years  with overtube/? Propofol  . Esophagogastroduodenoscopy  03/2012    SLF: gastritis    Current Outpatient Prescriptions  Medication Sig Dispense Refill  . amitriptyline (ELAVIL) 25 MG tablet Take 2 tablets (50 mg total) by mouth at bedtime. 60 tablet 6  . gabapentin (NEURONTIN) 300 MG capsule Take 1 capsule (300 mg total) by mouth 3 (three) times daily. 90 capsule 6  . HYDROcodone-acetaminophen (NORCO/VICODIN) 5-325 MG per tablet Take 1 tablet by mouth every 6 (six) hours as needed for pain. 20 tablet 0  . insulin aspart (NOVOLOG) 100 UNIT/ML injection Inject 10 Units into the skin 3 (three) times daily before meals. At breakfast, lunch and dinner    . insulin detemir (LEVEMIR) 100 UNIT/ML injection Inject into the skin every morning.    . meloxicam (MOBIC) 15 MG tablet Take 15 mg by mouth daily. with food  1  . omeprazole (PRILOSEC) 20 MG capsule Take 20 mg by mouth daily. 1 po bid 30 minutes before meals for 3 mos then once daily FOR THE NEXT YEAR    . ONE TOUCH ULTRA TEST test strip   11  . polyethylene glycol (MIRALAX / GLYCOLAX) packet Take 17 g by mouth as needed.     . Vitamin D, Ergocalciferol, (DRISDOL) 50000 UNITS CAPS capsule Take 50,000 Units by mouth 2 (two) times a week.   5   No current facility-administered medications for this visit.    Allergies:  Other; Iodine; and Neosporin   Social History: The patient  reports that she has never smoked. She has never used smokeless tobacco. She reports  that she does not drink alcohol or use illicit drugs.   Family History: The patient's family history includes Diabetes in her father; Lupus in her sister; Stroke in her brother and mother.   ROS:  Please see the history of present illness. Otherwise, complete review of systems is positive for leg neuropathy.  All other systems are reviewed and negative.   Physical Exam: VS:  BP 130/80 mmHg  Pulse 116  Ht 5\' 8"  (1.727 m)  Wt 149 lb (67.586 kg)  BMI 22.66 kg/m2  SpO2 98%, BMI Body  mass index is 22.66 kg/(m^2).  Wt Readings from Last 3 Encounters:  04/21/14 149 lb (67.586 kg)  04/02/14 147 lb 9.6 oz (66.951 kg)  03/17/14 145 lb 12.8 oz (66.134 kg)     General: Patient appears comfortable at rest. HEENT: Conjunctiva and lids normal, oropharynx clear. Neck: Supple, no elevated JVP or carotid bruits, no thyromegaly. Lungs: Clear to auscultation, nonlabored breathing at rest. Cardiac: Rapid regular rate and rhythm, no S3 or significant systolic murmur, no pericardial rub. Abdomen: Soft, nontender, bowel sounds present, no guarding or rebound. Extremities: No pitting edema, distal pulses 2+. Skin: Warm and dry. Musculoskeletal: No kyphosis. Neuropsychiatric: Alert and oriented x3, affect grossly appropriate.   ECG: ECG is ordered today and shows sinus tachycardia with possible biatrial enlargement and nonspecific T-wave changes.   Recent Labwork: 03/19/2014: Hemoglobin 13.5, platelets 337, BUN 16, creatinine 0.5, hemoglobin A1c greater than 15% 04/02/2014: Free T4 1.03 04/02/2014: TSH 1.671 04/15/2014: ALT 8; AST 9, GGT 26, albumin 3.6, alkaline phosphatase 133   Assessment and Plan:  1. Sinus tachycardia. Uncertain whether this is persistent or paroxysmal. Basic evaluation for secondary causes has not been remarkable, specifically she is not anemic, thyroid status normal, renal function normal, no recent acute illnesses. Poorly controlled diabetes may be contributing in the form of autonomic neuropathy with elevated resting heart rate - this is probably the most likely diagnosis. On the other hand, she has not been screened for cardiomyopathy, and we will obtain an echocardiogram. I would also like to get a 24 Holter monitor to better assess her true heart rate variability, see if it slows down when she is asleep. We will call her with the results.  2. Type 2 diabetes mellitus, poorly controlled with recent hemoglobin A1c greater than 15%. Would be reasonable for her  to consider endocrinology consultation.  3. Chronic abdominal pain, followed by gastroenterology.  Current medicines were reviewed with the patient today. No changes were made.   Orders Placed This Encounter  Procedures  . EKG 12-Lead  . Holter monitor - 24 hour  . 2D Echocardiogram without contrast    Disposition: Call with results.   Signed, Satira Sark, MD, [facility] 04/21/2014 3:08 PM    [place] [address], [place], [place] [address] Phone: [phone]; Fax: [phone]

## 2014-04-21 NOTE — Addendum Note (Signed)
Addended by: Laurine Blazer on: 04/21/2014 03:37 PM   Modules accepted: Level of Service

## 2014-04-21 NOTE — Patient Instructions (Signed)
Your physician has requested that you have an echocardiogram. Echocardiography is a painless test that uses sound waves to create images of your heart. It provides your doctor with information about the size and shape of your heart and how well your heart's chambers and valves are working. This procedure takes approximately one hour. There are no restrictions for this procedure. Your physician has recommended that you wear a 24 holter monitor. Holter monitors are medical devices that record the heart's electrical activity. Doctors most often use these monitors to diagnose arrhythmias. Arrhythmias are problems with the speed or rhythm of the heartbeat. The monitor is a small, portable device. You can wear one while you do your normal daily activities. This is usually used to diagnose what is causing palpitations/syncope (passing out). Office will contact with results via phone or letter.   Continue all current medications. Follow up based on test results from above.

## 2014-04-22 ENCOUNTER — Other Ambulatory Visit: Payer: Self-pay | Admitting: *Deleted

## 2014-04-22 DIAGNOSIS — R0602 Shortness of breath: Secondary | ICD-10-CM

## 2014-04-22 DIAGNOSIS — R000 Tachycardia, unspecified: Secondary | ICD-10-CM

## 2014-04-24 ENCOUNTER — Encounter: Payer: Self-pay | Admitting: Gastroenterology

## 2014-04-24 ENCOUNTER — Other Ambulatory Visit: Payer: Self-pay

## 2014-04-24 DIAGNOSIS — R7989 Other specified abnormal findings of blood chemistry: Secondary | ICD-10-CM

## 2014-04-24 DIAGNOSIS — R945 Abnormal results of liver function studies: Secondary | ICD-10-CM

## 2014-04-24 NOTE — Progress Notes (Signed)
APPOINTMENT MADE AND LETTER SENT °

## 2014-04-24 NOTE — Progress Notes (Signed)
Quick Note:  Pt is aware of results. ______ 

## 2014-04-24 NOTE — Progress Notes (Signed)
Quick Note:  Pt is aware and lab order on file for 07/24/2014. Routing to [place] to schedule the OV appt. ______

## 2014-04-24 NOTE — Progress Notes (Signed)
Quick Note:  GES is normal. See lab result note attached to LFTs/GGT.  Please let pt know results.   ______

## 2014-04-24 NOTE — Progress Notes (Signed)
Quick Note:  Alk phos better, down from 160 to 133. GGT normal. This implies that her Alkphos is up due to bone etiology. Possibly related to her vit D def and improving now on Vit D supplementation.   For early satiety, nausea, weight loss-->likely due to poorly controlled diabetes. Last HgbA1C >15%.  Recommend better DM control.  Return to see SLF only in 8-12 weeks. If increasing GI problems, consider abd u/s to relook at gb as next step. ______

## 2014-04-24 NOTE — Progress Notes (Signed)
Quick Note:  We should also repeat LFTs in 3 months. ______

## 2014-05-01 ENCOUNTER — Other Ambulatory Visit: Payer: Self-pay

## 2014-05-01 ENCOUNTER — Ambulatory Visit (INDEPENDENT_AMBULATORY_CARE_PROVIDER_SITE_OTHER): Payer: BLUE CROSS/BLUE SHIELD | Admitting: *Deleted

## 2014-05-01 ENCOUNTER — Ambulatory Visit (INDEPENDENT_AMBULATORY_CARE_PROVIDER_SITE_OTHER): Payer: BLUE CROSS/BLUE SHIELD

## 2014-05-01 DIAGNOSIS — I471 Supraventricular tachycardia: Secondary | ICD-10-CM

## 2014-05-01 DIAGNOSIS — R000 Tachycardia, unspecified: Secondary | ICD-10-CM

## 2014-05-01 DIAGNOSIS — R0602 Shortness of breath: Secondary | ICD-10-CM

## 2014-05-01 NOTE — Progress Notes (Signed)
Patient in office this morning for 24 hour holter placement with instructions given.

## 2014-05-02 ENCOUNTER — Telehealth: Payer: Self-pay | Admitting: *Deleted

## 2014-05-02 NOTE — Telephone Encounter (Signed)
Patient informed. 

## 2014-05-02 NOTE — Telephone Encounter (Signed)
-----   Message from Satira Sark, MD sent at 05/01/2014  2:01 PM EDT ----- Reviewed. LVEF is vigorous, no evidence of cardiomyopathy or clinically significant valvular abnormalities. There is a trivial pericardial effusion that is unlikely symptom provoking, or an explanation for her intermittently elevated heart rate. Will await results of her heart monitor.

## 2014-05-16 ENCOUNTER — Ambulatory Visit (INDEPENDENT_AMBULATORY_CARE_PROVIDER_SITE_OTHER): Payer: BLUE CROSS/BLUE SHIELD

## 2014-05-16 ENCOUNTER — Other Ambulatory Visit: Payer: Self-pay | Admitting: *Deleted

## 2014-05-16 DIAGNOSIS — R0602 Shortness of breath: Secondary | ICD-10-CM | POA: Diagnosis not present

## 2014-05-16 DIAGNOSIS — R000 Tachycardia, unspecified: Secondary | ICD-10-CM | POA: Diagnosis not present

## 2014-06-18 ENCOUNTER — Ambulatory Visit: Payer: Self-pay | Admitting: Diagnostic Neuroimaging

## 2014-06-18 ENCOUNTER — Telehealth: Payer: Self-pay | Admitting: Gastroenterology

## 2014-06-18 NOTE — Telephone Encounter (Signed)
Letter being mailed for blood work

## 2014-06-18 NOTE — Telephone Encounter (Signed)
Pt has OV with SF on 6/29 at 830 with SF and is on the July recall list to have repeat LFTs done. Should she have this done prior to her OV

## 2014-06-20 ENCOUNTER — Other Ambulatory Visit: Payer: Self-pay

## 2014-06-20 DIAGNOSIS — R945 Abnormal results of liver function studies: Secondary | ICD-10-CM

## 2014-06-20 DIAGNOSIS — R7989 Other specified abnormal findings of blood chemistry: Secondary | ICD-10-CM

## 2014-07-02 ENCOUNTER — Ambulatory Visit: Payer: BLUE CROSS/BLUE SHIELD | Admitting: Gastroenterology

## 2014-07-24 ENCOUNTER — Ambulatory Visit: Payer: BLUE CROSS/BLUE SHIELD | Admitting: Gastroenterology

## 2014-08-08 ENCOUNTER — Encounter: Payer: Self-pay | Admitting: *Deleted

## 2014-08-20 ENCOUNTER — Ambulatory Visit: Payer: BLUE CROSS/BLUE SHIELD | Admitting: Gastroenterology

## 2014-08-20 ENCOUNTER — Encounter: Payer: Self-pay | Admitting: Gastroenterology

## 2014-08-20 ENCOUNTER — Telehealth: Payer: Self-pay | Admitting: Gastroenterology

## 2014-08-20 NOTE — Telephone Encounter (Signed)
PATIENT WAS A NO SHOW AND LETTER SENT  °

## 2014-12-19 DIAGNOSIS — R809 Proteinuria, unspecified: Secondary | ICD-10-CM | POA: Insufficient documentation

## 2014-12-19 DIAGNOSIS — E785 Hyperlipidemia, unspecified: Secondary | ICD-10-CM | POA: Insufficient documentation

## 2015-03-10 ENCOUNTER — Other Ambulatory Visit: Payer: Self-pay | Admitting: Diagnostic Neuroimaging

## 2015-03-17 DIAGNOSIS — E1142 Type 2 diabetes mellitus with diabetic polyneuropathy: Secondary | ICD-10-CM | POA: Insufficient documentation

## 2015-09-24 ENCOUNTER — Encounter (HOSPITAL_COMMUNITY): Payer: Self-pay | Admitting: Cardiology

## 2015-09-24 ENCOUNTER — Emergency Department (HOSPITAL_COMMUNITY)
Admission: EM | Admit: 2015-09-24 | Discharge: 2015-09-24 | Disposition: A | Discharge status: Home / Self Care | Payer: BLUE CROSS/BLUE SHIELD | Attending: Emergency Medicine | Admitting: Emergency Medicine

## 2015-09-24 DIAGNOSIS — E1165 Type 2 diabetes mellitus with hyperglycemia: Secondary | ICD-10-CM | POA: Diagnosis not present

## 2015-09-24 DIAGNOSIS — L03115 Cellulitis of right lower limb: Secondary | ICD-10-CM | POA: Insufficient documentation

## 2015-09-24 DIAGNOSIS — Z794 Long term (current) use of insulin: Secondary | ICD-10-CM | POA: Insufficient documentation

## 2015-09-24 DIAGNOSIS — M79671 Pain in right foot: Secondary | ICD-10-CM | POA: Diagnosis present

## 2015-09-24 DIAGNOSIS — R739 Hyperglycemia, unspecified: Secondary | ICD-10-CM

## 2015-09-24 LAB — CBC WITH DIFFERENTIAL/PLATELET
Basophils Absolute: 0 10*3/uL (ref 0.0–0.1)
Basophils Relative: 0 %
Eosinophils Absolute: 0.1 10*3/uL (ref 0.0–0.7)
Eosinophils Relative: 2 %
HCT: 37 % (ref 36.0–46.0)
Hemoglobin: 12.6 g/dL (ref 12.0–15.0)
Lymphocytes Relative: 28 %
Lymphs Abs: 1.8 10*3/uL (ref 0.7–4.0)
MCH: 28.5 pg (ref 26.0–34.0)
MCHC: 34.1 g/dL (ref 30.0–36.0)
MCV: 83.7 fL (ref 78.0–100.0)
Monocytes Absolute: 0.4 10*3/uL (ref 0.1–1.0)
Monocytes Relative: 7 %
Neutro Abs: 4.1 10*3/uL (ref 1.7–7.7)
Neutrophils Relative %: 63 %
Platelets: 267 10*3/uL (ref 150–400)
RBC: 4.42 MIL/uL (ref 3.87–5.11)
RDW: 12.8 % (ref 11.5–15.5)
WBC: 6.5 10*3/uL (ref 4.0–10.5)

## 2015-09-24 LAB — BASIC METABOLIC PANEL
Anion gap: 1 — ABNORMAL LOW (ref 5–15)
BUN: 9 mg/dL (ref 6–20)
CO2: 25 mmol/L (ref 22–32)
Calcium: 8.5 mg/dL — ABNORMAL LOW (ref 8.9–10.3)
Chloride: 102 mmol/L (ref 101–111)
Creatinine, Ser: 0.54 mg/dL (ref 0.44–1.00)
GFR calc Af Amer: >60 mL/min (ref >60)
GFR calc non Af Amer: >60 mL/min (ref >60)
Glucose, Bld: 408 mg/dL — ABNORMAL HIGH (ref 65–99)
Potassium: 3.2 mmol/L — ABNORMAL LOW (ref 3.5–5.1)
Sodium: 128 mmol/L — ABNORMAL LOW (ref 135–145)

## 2015-09-24 MED ORDER — CLINDAMYCIN HCL 300 MG PO CAPS: 300.0000 mg | ORAL_CAPSULE | Freq: Four times a day (QID) | ORAL | 0 refills | Status: DC

## 2015-09-24 MED ORDER — HYDROCODONE-ACETAMINOPHEN 5-325 MG PO TABS: 1.0000 | ORAL_TABLET | ORAL | 0 refills | Status: DC | PRN

## 2015-09-24 MED ORDER — IBUPROFEN 600 MG PO TABS: 600.0000 mg | ORAL_TABLET | Freq: Four times a day (QID) | ORAL | 0 refills | Status: DC | PRN

## 2015-09-24 MED ORDER — CLINDAMYCIN PHOSPHATE 600 MG/50ML IV SOLN
600.0000 mg | Freq: Once | INTRAVENOUS | Status: AC
  Administered 2015-09-24: 600 mg via INTRAVENOUS
  Filled 2015-09-24: qty 50

## 2015-09-24 MED ORDER — HYDROCODONE-ACETAMINOPHEN 5-325 MG PO TABS
1.0000 | ORAL_TABLET | Freq: Once | ORAL | Status: AC
  Administered 2015-09-24: 1 via ORAL
  Filled 2015-09-24: qty 1

## 2015-09-24 MED ORDER — INSULIN ASPART 100 UNIT/ML ~~LOC~~ SOLN
8.0000 [IU] | Freq: Once | SUBCUTANEOUS | Status: AC
  Administered 2015-09-24: 8 [IU] via SUBCUTANEOUS
  Filled 2015-09-24: qty 1

## 2015-09-24 MED ORDER — KETOROLAC TROMETHAMINE 30 MG/ML IJ SOLN
30.0000 mg | Freq: Once | INTRAMUSCULAR | Status: AC
  Administered 2015-09-24: 30 mg via INTRAVENOUS
  Filled 2015-09-24: qty 1

## 2015-09-24 NOTE — Discharge Instructions (Signed)
Take your next dose of clindamycin tomorrow morning.  Use caution with the hydrocodone as this will make you drowsy.  Do not drive within 4 hours of taking this medication.  As discussed please elevate your foot as much as possible and apply warm compresses.

## 2015-09-24 NOTE — ED Provider Notes (Signed)
[facility] DEPT Provider Note   CSN: [id number] Arrival date & time: 09/24/15  1520     History   Chief Complaint No chief complaint on file.   HPI Brianna [name] is a 55 y.o. female presenting with a Three-day history with right foot pain, swelling and redness.  She suspects she may have been bit by an insect, but does not recall the incident.  Patient is a diabetic, stating her last CBG was 168.  There has been no drainage from the wound site, but she has had increased spreading redness of the dorsum of her foot.  She has taken ibuprofen 400 mg every 4 hours with no improvement in pain or swelling. Weight bearing worsens pain.  She denies history of gout.  She's had no fevers or chills, there is no radiation of pain into her leg.    The history is provided by the patient.    Past Medical History:  Diagnosis Date  . Chronic abdominal pain   . Gastritis   . Heartburn   . Type 2 diabetes mellitus with diabetic neuropathy [facility])     Patient Active Problem List   Diagnosis Date Noted  . Heart rate fast 04/21/2014  . Early satiety 04/02/2014  . Left flank pain 08/27/2012  . Loss of weight 08/27/2012  . Upper abdominal pain 03/12/2012  . Unspecified constipation 03/12/2012  . Abdominal wall pain 03/12/2012    Past Surgical History:  Procedure Laterality Date  . CARPAL TUNNEL RELEASE     left  . COLONOSCOPY WITH ESOPHAGOGASTRODUODENOSCOPY (EGD) N/A 04/02/2012   SLF: SMALL RECTAL polyp/Small internal hemorrhoids. Polyp hyperplastic. On EGD she had mild gastritis. Biopsy showed chronic and active gastritis and no H. pylori. Next colonoscopy in 10 years with overtube/? Propofol  . ESOPHAGOGASTRODUODENOSCOPY  03/2012   SLF: gastritis  . PARTIAL HYSTERECTOMY      OB History    No data available       Home Medications    Prior to Admission medications   Medication Sig Start Date End Date Taking? Authorizing Provider  amitriptyline (ELAVIL) 25 MG tablet Take 2  tablets (50 mg total) by mouth at bedtime. 03/17/14   Penni Bombard, MD  clindamycin (CLEOCIN) 300 MG capsule Take 1 capsule (300 mg total) by mouth every 6 (six) hours. 09/24/15   Evalee Jefferson, PA-C  gabapentin (NEURONTIN) 300 MG capsule Take 1 capsule (300 mg total) by mouth 3 (three) times daily. 03/17/14   Penni Bombard, MD  HYDROcodone-acetaminophen (NORCO/VICODIN) 5-325 MG tablet Take 1 tablet by mouth every 4 (four) hours as needed. 09/24/15   Evalee Jefferson, PA-C  ibuprofen (ADVIL,MOTRIN) 600 MG tablet Take 1 tablet (600 mg total) by mouth every 6 (six) hours as needed. 09/24/15   Evalee Jefferson, PA-C  insulin aspart (NOVOLOG) 100 UNIT/ML injection Inject 10 Units into the skin 3 (three) times daily before meals. At breakfast, lunch and dinner    Historical Provider, MD  insulin detemir (LEVEMIR) 100 UNIT/ML injection Inject into the skin every morning.    Historical Provider, MD  meloxicam (MOBIC) 15 MG tablet Take 15 mg by mouth daily. with food 01/15/14   Historical Provider, MD  omeprazole (PRILOSEC) 20 MG capsule Take 20 mg by mouth daily. 1 po bid 30 minutes before meals for 3 mos then once daily FOR THE NEXT YEAR 04/02/12   Danie Binder, MD  ONE TOUCH ULTRA TEST test strip  02/04/14   Historical Provider, MD  polyethylene glycol (MIRALAX /  GLYCOLAX) packet Take 17 g by mouth as needed.     Historical Provider, MD  Vitamin D, Ergocalciferol, (DRISDOL) 50000 UNITS CAPS capsule Take 50,000 Units by mouth 2 (two) times a week.  03/20/14   Historical Provider, MD    Family History Family History  Problem Relation Age of Onset  . Stroke Mother   . Lupus Sister   . Stroke Brother   . Diabetes Father     Social History Social History  Substance Use Topics  . Smoking status: Never Smoker  . Smokeless tobacco: Never Used  . Alcohol use No     Allergies   Other; Iodine; and Neosporin [neomycin-bacitracin zn-polymyx]   Review of Systems Review of Systems  Constitutional: Negative for  chills and fever.  Respiratory: Negative for shortness of breath and wheezing.   Skin: Positive for color change and wound.  Neurological: Negative for numbness.     Physical Exam Updated Vital Signs BP 141/83 (BP Location: Left Arm)   Pulse 80   Temp 98 F (36.7 C) (Oral)   Resp 18   Ht 5\' 8"  (1.727 m)   Wt 72.6 kg   SpO2 100%   BMI 24.33 kg/m   Physical Exam  Constitutional: She appears well-developed and well-nourished. No distress.  HENT:  Head: Normocephalic.  Neck: Neck supple.  Cardiovascular: Normal rate.   Pulmonary/Chest: Effort normal. She has no wheezes.  Musculoskeletal: Normal range of motion. She exhibits no edema.  Skin: There is erythema.  There is a darkened area right proximal dorsal foot with a central punctum, suggesting possible bite.  There is no induration or fluctuance and no drainage.  Patient has mild edema of her entire dorsal foot with erythema and mild increased warmth.  There is no red streaking, there is no toe involvement.  She has a full dorsalis pedal pulse.  Full range of motion of toes with less than 2 second cap refill.     ED Treatments / Results  Labs (all labs ordered are listed, but only abnormal results are displayed) Labs Reviewed  BASIC METABOLIC PANEL - Abnormal; Notable for the following:       Result Value   Sodium 128 (*)    Potassium 3.2 (*)    Glucose, Bld 408 (*)    Calcium 8.5 (*)    Anion gap 1 (*)    All other components within normal limits  CBC WITH DIFFERENTIAL/PLATELET    EKG  EKG Interpretation None       Radiology No results found.  Procedures Procedures (including critical care time)  Medications Ordered in ED Medications  clindamycin (CLEOCIN) IVPB 600 mg (0 mg Intravenous Stopped 09/24/15 1749)  ketorolac (TORADOL) 30 MG/ML injection 30 mg (30 mg Intravenous Given 09/24/15 1719)  HYDROcodone-acetaminophen (NORCO/VICODIN) 5-325 MG per tablet 1 tablet (1 tablet Oral Given 09/24/15 1719)    insulin aspart (novoLOG) injection 8 Units (8 Units Subcutaneous Given 09/24/15 1906)     Initial Impression / Assessment and Plan / ED Course  I have reviewed the triage vital signs and the nursing notes.  Pertinent labs & imaging results that were available during my care of the patient were reviewed by me and considered in my medical decision making (see chart for details).  Clinical Course     basic labs were checked today including CBC and BMET.  She was given IV clindamycin and will be prescribed PO clindamycin.  Cellulitis edges marked.  Hydrocodone.  Warm soaks, elevation and f/u  with pcp on Monday, returning here over the weekend for any worsening symptoms or infection that is not responding over the next 48 hours to this treatment.   Final Clinical Impressions(s) / ED Diagnoses   Final diagnoses:  Cellulitis of right lower extremity  Hyperglycemia    New Prescriptions Discharge Medication List as of 09/24/2015  7:07 PM    START taking these medications   Details  clindamycin (CLEOCIN) 300 MG capsule Take 1 capsule (300 mg total) by mouth every 6 (six) hours., Starting Thu 09/24/2015, Print    ibuprofen (ADVIL,MOTRIN) 600 MG tablet Take 1 tablet (600 mg total) by mouth every 6 (six) hours as needed., Starting Thu 09/24/2015, Print         Evalee Jefferson, PA-C 09/25/15 2118    Margette Fast, MD 09/27/15 1807

## 2015-09-24 NOTE — ED Triage Notes (Signed)
Right foot red and swollen times 3 days.  Denies any injury. Thinks she got bit.

## 2015-12-22 ENCOUNTER — Encounter (HOSPITAL_COMMUNITY): Payer: Self-pay

## 2015-12-22 ENCOUNTER — Emergency Department (HOSPITAL_COMMUNITY)
Admission: EM | Admit: 2015-12-22 | Discharge: 2015-12-22 | Disposition: A | Discharge status: Home / Self Care | Payer: BLUE CROSS/BLUE SHIELD | Attending: Emergency Medicine | Admitting: Emergency Medicine

## 2015-12-22 DIAGNOSIS — K029 Dental caries, unspecified: Secondary | ICD-10-CM | POA: Diagnosis not present

## 2015-12-22 DIAGNOSIS — K0889 Other specified disorders of teeth and supporting structures: Secondary | ICD-10-CM | POA: Diagnosis present

## 2015-12-22 DIAGNOSIS — E119 Type 2 diabetes mellitus without complications: Secondary | ICD-10-CM | POA: Insufficient documentation

## 2015-12-22 DIAGNOSIS — Z79899 Other long term (current) drug therapy: Secondary | ICD-10-CM | POA: Diagnosis not present

## 2015-12-22 DIAGNOSIS — Z794 Long term (current) use of insulin: Secondary | ICD-10-CM | POA: Insufficient documentation

## 2015-12-22 MED ORDER — TRAMADOL HCL 50 MG PO TABS: ORAL_TABLET | ORAL | 0 refills | Status: DC

## 2015-12-22 MED ORDER — IBUPROFEN 800 MG PO TABS
800.0000 mg | ORAL_TABLET | Freq: Once | ORAL | Status: AC
  Administered 2015-12-22: 800 mg via ORAL
  Filled 2015-12-22: qty 1

## 2015-12-22 MED ORDER — AMOXICILLIN 500 MG PO CAPS: 500.0000 mg | ORAL_CAPSULE | Freq: Three times a day (TID) | ORAL | 0 refills | Status: DC

## 2015-12-22 MED ORDER — AMOXICILLIN 250 MG PO CAPS
500.0000 mg | ORAL_CAPSULE | Freq: Once | ORAL | Status: AC
  Administered 2015-12-22: 500 mg via ORAL
  Filled 2015-12-22: qty 2

## 2015-12-22 MED ORDER — IBUPROFEN 600 MG PO TABS: 600.0000 mg | ORAL_TABLET | Freq: Four times a day (QID) | ORAL | 0 refills | Status: DC

## 2015-12-22 NOTE — ED Provider Notes (Signed)
[facility] DEPT Provider Note   CSN: [id number] Arrival date & time: 12/22/15  1454     History   Chief Complaint Chief Complaint  Patient presents with  . Dental Pain    HPI Brianna [name] is a 55 y.o. female.  The history is provided by the patient.  Dental Pain   This is a new problem. The current episode started 6 to 12 hours ago. The problem occurs hourly. The problem has been gradually worsening. The pain is moderate. She has tried acetaminophen and aspirin for the symptoms. The treatment provided no relief.    Past Medical History:  Diagnosis Date  . Chronic abdominal pain   . Gastritis   . Heartburn   . Type 2 diabetes mellitus with diabetic neuropathy [facility])     Patient Active Problem List   Diagnosis Date Noted  . Heart rate fast 04/21/2014  . Early satiety 04/02/2014  . Left flank pain 08/27/2012  . Loss of weight 08/27/2012  . Upper abdominal pain 03/12/2012  . Unspecified constipation 03/12/2012  . Abdominal wall pain 03/12/2012    Past Surgical History:  Procedure Laterality Date  . CARPAL TUNNEL RELEASE     left  . COLONOSCOPY WITH ESOPHAGOGASTRODUODENOSCOPY (EGD) N/A 04/02/2012   SLF: SMALL RECTAL polyp/Small internal hemorrhoids. Polyp hyperplastic. On EGD she had mild gastritis. Biopsy showed chronic and active gastritis and no H. pylori. Next colonoscopy in 10 years with overtube/? Propofol  . ESOPHAGOGASTRODUODENOSCOPY  03/2012   SLF: gastritis  . PARTIAL HYSTERECTOMY      OB History    No data available       Home Medications    Prior to Admission medications   Medication Sig Start Date End Date Taking? Authorizing Provider  amitriptyline (ELAVIL) 25 MG tablet Take 2 tablets (50 mg total) by mouth at bedtime. 03/17/14   Penni Bombard, MD  clindamycin (CLEOCIN) 300 MG capsule Take 1 capsule (300 mg total) by mouth every 6 (six) hours. 09/24/15   Evalee Jefferson, PA-C  gabapentin (NEURONTIN) 300 MG capsule Take 1 capsule (300  mg total) by mouth 3 (three) times daily. 03/17/14   Penni Bombard, MD  HYDROcodone-acetaminophen (NORCO/VICODIN) 5-325 MG tablet Take 1 tablet by mouth every 4 (four) hours as needed. 09/24/15   Evalee Jefferson, PA-C  ibuprofen (ADVIL,MOTRIN) 600 MG tablet Take 1 tablet (600 mg total) by mouth every 6 (six) hours as needed. 09/24/15   Evalee Jefferson, PA-C  insulin aspart (NOVOLOG) 100 UNIT/ML injection Inject 10 Units into the skin 3 (three) times daily before meals. At breakfast, lunch and dinner    Historical Provider, MD  insulin detemir (LEVEMIR) 100 UNIT/ML injection Inject into the skin every morning.    Historical Provider, MD  meloxicam (MOBIC) 15 MG tablet Take 15 mg by mouth daily. with food 01/15/14   Historical Provider, MD  omeprazole (PRILOSEC) 20 MG capsule Take 20 mg by mouth daily. 1 po bid 30 minutes before meals for 3 mos then once daily FOR THE NEXT YEAR 04/02/12   Danie Binder, MD  ONE TOUCH ULTRA TEST test strip  02/04/14   Historical Provider, MD  polyethylene glycol (MIRALAX / GLYCOLAX) packet Take 17 g by mouth as needed.     Historical Provider, MD  Vitamin D, Ergocalciferol, (DRISDOL) 50000 UNITS CAPS capsule Take 50,000 Units by mouth 2 (two) times a week.  03/20/14   Historical Provider, MD    Family History Family History  Problem Relation Age of  Onset  . Stroke Mother   . Lupus Sister   . Stroke Brother   . Diabetes Father     Social History Social History  Substance Use Topics  . Smoking status: Never Smoker  . Smokeless tobacco: Never Used  . Alcohol use No     Allergies   Other; Iodine; and Neosporin [neomycin-bacitracin zn-polymyx]   Review of Systems Review of Systems  Constitutional: Negative for activity change and fever.       All ROS Neg except as noted in HPI  HENT: Positive for dental problem. Negative for facial swelling and nosebleeds.   Eyes: Negative for photophobia and discharge.  Respiratory: Negative for cough, shortness of breath and  wheezing.   Cardiovascular: Negative for chest pain and palpitations.  Gastrointestinal: Negative for abdominal pain and blood in stool.  Genitourinary: Negative for dysuria, frequency and hematuria.  Musculoskeletal: Negative for arthralgias, back pain and neck pain.  Skin: Negative.   Neurological: Negative for dizziness, seizures and speech difficulty.  Psychiatric/Behavioral: Negative for confusion and hallucinations.     Physical Exam Updated Vital Signs BP 161/97   Pulse 103   Temp 98.1 F (36.7 C) (Oral)   Resp 16   Ht 5\' 8"  (1.727 m)   Wt 64.4 kg   SpO2 98%   BMI 21.59 kg/m   Physical Exam  Constitutional: She is oriented to person, place, and time. She appears well-developed and well-nourished.  Non-toxic appearance.  HENT:  Head: Normocephalic.  Right Ear: Tympanic membrane and external ear normal.  Left Ear: Tympanic membrane and external ear normal.  Mouth/Throat: Oropharynx is clear and moist and mucous membranes are normal. No trismus in the jaw. Dental caries present. No dental abscesses or uvula swelling.    Eyes: EOM and lids are normal. Pupils are equal, round, and reactive to light.  Neck: Normal range of motion. Neck supple. Carotid bruit is not present.  Cardiovascular: Normal rate, regular rhythm, normal heart sounds, intact distal pulses and normal pulses.   Pulmonary/Chest: Breath sounds normal. No respiratory distress.  Abdominal: Soft. Bowel sounds are normal. There is no tenderness. There is no guarding.  Musculoskeletal: Normal range of motion.  Lymphadenopathy:       Head (right side): No submandibular adenopathy present.       Head (left side): No submandibular adenopathy present.    She has no cervical adenopathy.  Neurological: She is alert and oriented to person, place, and time. She has normal strength. No cranial nerve deficit or sensory deficit.  Skin: Skin is warm and dry.  Psychiatric: She has a normal mood and affect. Her speech is  normal.  Nursing note and vitals reviewed.    ED Treatments / Results  Labs (all labs ordered are listed, but only abnormal results are displayed) Labs Reviewed - No data to display  EKG  EKG Interpretation None       Radiology No results found.  Procedures Procedures (including critical care time)  Medications Ordered in ED Medications - No data to display   Initial Impression / Assessment and Plan / ED Course  I have reviewed the triage vital signs and the nursing notes.  Pertinent labs & imaging results that were available during my care of the patient were reviewed by me and considered in my medical decision making (see chart for details).  Clinical Course     **I have reviewed nursing notes, vital signs, and all appropriate lab and imaging results for this patient.*  Final  Clinical Impressions(s) / ED Diagnoses MDM The blood pressure is slightly elevated at 166/85 . Vital signs are otherwise within normal limits. The patient has dental caries. There is no visible abscess appreciated at this time. There is no evidence for Ludwig's Angina or other dental emergency. patient will be placed on Amoxil. She is asked to use Tylenol Extra Strength for mild pain, she will use Ultram for more severe pain. I strongly encouraged her to see a dentist as soon as possible. Patient is in agreement with this plan.    Final diagnoses:  Dental caries    New Prescriptions Discharge Medication List as of 12/22/2015  4:55 PM    START taking these medications   Details  amoxicillin (AMOXIL) 500 MG capsule Take 1 capsule (500 mg total) by mouth 3 (three) times daily., Starting Tue 12/22/2015, Print    traMADol (ULTRAM) 50 MG tablet 1 or 2 po q6h prn pain, Print         Lily Kocher, PA-C 12/26/15 1105    Isla Pence, MD 01/03/16 [phone]

## 2015-12-22 NOTE — ED Triage Notes (Signed)
Pt c/o toothache since this morning.  Pt took ibuprofen around 1100 then later took a goody powder without relief.

## 2015-12-22 NOTE — Discharge Instructions (Signed)
It is important that you see a dentist as soon as possible. Please use Amoxil 3 times daily with food. Use ibuprofen with breakfast, lunch, dinner, and at bedtime. Use Ultram for more severe pain. This medication may cause drowsiness. Please do not drink, drive, or participate in activity that requires concentration while taking this medication.

## 2016-10-11 IMAGING — NM NM GASTRIC EMPTYING
8 series · 8 of 8 positions shown · non-contrast
Comparison: CT 09/04/2012.

CLINICAL DATA: Nausea.

EXAM:
NUCLEAR MEDICINE GASTRIC EMPTYING SCAN
TECHNIQUE: After oral ingestion of radiolabeled meal, sequential abdominal
images were obtained for 4 hours. Percentage of activity emptying
the stomach calculated at 1 hour, 2 hour, 3 hours.
RADIOPHARMACEUTICALS:  2.0 mCi Technetium 99-m labeled sulfur
colloid

[Series 1: 0 min · 4.14mm/px · 1 of 1 slices shown (1 of 2)]
[im 1/1]
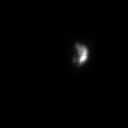

[Series 1: 0 min · 4.14mm/px · 1 of 1 slices shown (2 of 2)]
[im 1/1]
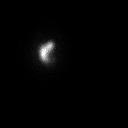

[Series 2: 60 min · 4.14mm/px · 1 of 1 slices shown (1 of 2)]
[im 1/1]
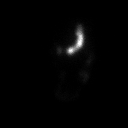

[Series 2: 60 min · 4.14mm/px · 1 of 1 slices shown (2 of 2)]
[im 1/1]
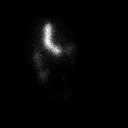

[Series 3: 120 min · 4.14mm/px · 1 of 1 slices shown (1 of 4)]
[im 1/1]
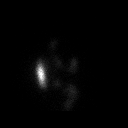

[Series 3: 120 min · 4.14mm/px · 1 of 1 slices shown (2 of 4)]
[im 1/1]
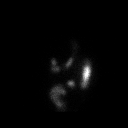

[Series 4: 120 min · 4.14mm/px · 1 of 1 slices shown (3 of 4)]
[im 1/1]
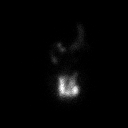

[Series 4: 120 min · 4.14mm/px · 1 of 1 slices shown (4 of 4)]
[im 1/1]
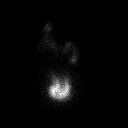

[8 of 8 positions shown; findings below may reference images not displayed]

FINDINGS: Expected location of the stomach in the left upper quadrant.
Ingested meal empties the stomach gradually over the course of the
study.

18.8% emptied at 1 hr ( normal >= 10%)

84.6 % emptied at 2 hr ( normal >= 40%)

89.7% emptied at 3 hr ( normal >= 70%)
IMPRESSION: Normal gastric emptying study.

## 2016-11-14 ENCOUNTER — Emergency Department (HOSPITAL_COMMUNITY)
Admission: EM | Admit: 2016-11-14 | Discharge: 2016-11-14 | Disposition: A | Discharge status: Home / Self Care | Payer: BLUE CROSS/BLUE SHIELD | Attending: Emergency Medicine | Admitting: Emergency Medicine

## 2016-11-14 ENCOUNTER — Encounter (HOSPITAL_COMMUNITY): Payer: Self-pay | Admitting: Cardiology

## 2016-11-14 ENCOUNTER — Emergency Department (HOSPITAL_COMMUNITY): Payer: BLUE CROSS/BLUE SHIELD

## 2016-11-14 DIAGNOSIS — Z794 Long term (current) use of insulin: Secondary | ICD-10-CM | POA: Diagnosis not present

## 2016-11-14 DIAGNOSIS — N390 Urinary tract infection, site not specified: Secondary | ICD-10-CM | POA: Insufficient documentation

## 2016-11-14 DIAGNOSIS — Z79899 Other long term (current) drug therapy: Secondary | ICD-10-CM | POA: Diagnosis not present

## 2016-11-14 DIAGNOSIS — E1140 Type 2 diabetes mellitus with diabetic neuropathy, unspecified: Secondary | ICD-10-CM | POA: Diagnosis not present

## 2016-11-14 DIAGNOSIS — R1012 Left upper quadrant pain: Secondary | ICD-10-CM | POA: Diagnosis present

## 2016-11-14 LAB — COMPREHENSIVE METABOLIC PANEL
ALT: 11 U/L — ABNORMAL LOW (ref 14–54)
AST: 13 U/L — ABNORMAL LOW (ref 15–41)
Albumin: 3.6 g/dL (ref 3.5–5.0)
Alkaline Phosphatase: 115 U/L (ref 38–126)
Anion gap: 6 (ref 5–15)
BUN: 15 mg/dL (ref 6–20)
CO2: 30 mmol/L (ref 22–32)
Calcium: 9.3 mg/dL (ref 8.9–10.3)
Chloride: 101 mmol/L (ref 101–111)
Creatinine, Ser: 0.54 mg/dL (ref 0.44–1.00)
GFR calc Af Amer: >60 mL/min (ref >60)
GFR calc non Af Amer: >60 mL/min (ref >60)
Glucose, Bld: 224 mg/dL — ABNORMAL HIGH (ref 65–99)
Potassium: 3.2 mmol/L — ABNORMAL LOW (ref 3.5–5.1)
Sodium: 137 mmol/L (ref 135–145)
Total Bilirubin: 0.3 mg/dL (ref 0.3–1.2)
Total Protein: 7.6 g/dL (ref 6.5–8.1)

## 2016-11-14 LAB — URINALYSIS, ROUTINE W REFLEX MICROSCOPIC
Bilirubin Urine: NEGATIVE
Glucose, UA: >=500 mg/dL — AB
Ketones, ur: NEGATIVE mg/dL
Nitrite: NEGATIVE
Protein, ur: 100 mg/dL — AB
Specific Gravity, Urine: 1.022 (ref 1.005–1.030)
Squamous Epithelial / LPF: NONE SEEN
pH: 5 (ref 5.0–8.0)

## 2016-11-14 LAB — CBC WITH DIFFERENTIAL/PLATELET
Basophils Absolute: 0 10*3/uL (ref 0.0–0.1)
Basophils Relative: 1 %
Eosinophils Absolute: 0.3 10*3/uL (ref 0.0–0.7)
Eosinophils Relative: 4 %
HCT: 37.8 % (ref 36.0–46.0)
Hemoglobin: 12.5 g/dL (ref 12.0–15.0)
Lymphocytes Relative: 42 %
Lymphs Abs: 2.7 10*3/uL (ref 0.7–4.0)
MCH: 27.5 pg (ref 26.0–34.0)
MCHC: 33.1 g/dL (ref 30.0–36.0)
MCV: 83.1 fL (ref 78.0–100.0)
Monocytes Absolute: 0.5 10*3/uL (ref 0.1–1.0)
Monocytes Relative: 8 %
Neutro Abs: 2.9 10*3/uL (ref 1.7–7.7)
Neutrophils Relative %: 45 %
Platelets: 366 10*3/uL (ref 150–400)
RBC: 4.55 MIL/uL (ref 3.87–5.11)
RDW: 13.4 % (ref 11.5–15.5)
WBC: 6.5 10*3/uL (ref 4.0–10.5)

## 2016-11-14 LAB — LIPASE, BLOOD: Lipase: 25 U/L (ref 11–51)

## 2016-11-14 MED ORDER — CEPHALEXIN 500 MG PO CAPS: 500.0000 mg | ORAL_CAPSULE | Freq: Four times a day (QID) | ORAL | 0 refills | Status: DC

## 2016-11-14 MED ORDER — FLUCONAZOLE 150 MG PO TABS: 150.0000 mg | ORAL_TABLET | Freq: Once | ORAL | 0 refills | Status: AC

## 2016-11-14 MED ORDER — CEPHALEXIN 500 MG PO CAPS
500.0000 mg | ORAL_CAPSULE | Freq: Once | ORAL | Status: AC
  Administered 2016-11-14: 500 mg via ORAL
  Filled 2016-11-14: qty 1

## 2016-11-14 MED ORDER — IOPAMIDOL (ISOVUE-300) INJECTION 61%
100.0000 mL | Freq: Once | INTRAVENOUS | Status: AC | PRN
  Administered 2016-11-14: 100 mL via INTRAVENOUS

## 2016-11-14 MED ORDER — TRAMADOL HCL 50 MG PO TABS: 50.0000 mg | ORAL_TABLET | Freq: Four times a day (QID) | ORAL | 0 refills | Status: DC | PRN

## 2016-11-14 NOTE — ED Provider Notes (Signed)
www [facility] Provider Note   CSN: [id number] Arrival date & time: 11/14/16  1514     History   Chief Complaint Chief Complaint  Patient presents with  . Abdominal Pain    HPI Brianna [name] is a 56 y.o. female.  Patient complains of left flank and abdominal pain.  No fever chills nausea vomiting   The history is provided by the patient. No language interpreter was used.  Abdominal Pain   This is a new problem. The current episode started more than 2 days ago. The problem occurs constantly. The problem has not changed since onset.The pain is associated with an unknown factor. The pain is located in the LUQ. The quality of the pain is aching. The pain is at a severity of 6/10. Pertinent negatives include diarrhea, frequency, hematuria and headaches.    Past Medical History:  Diagnosis Date  . Chronic abdominal pain   . Gastritis   . Heartburn   . Type 2 diabetes mellitus with diabetic neuropathy [facility])     Patient Active Problem List   Diagnosis Date Noted  . Heart rate fast 04/21/2014  . Early satiety 04/02/2014  . Left flank pain 08/27/2012  . Loss of weight 08/27/2012  . Upper abdominal pain 03/12/2012  . Unspecified constipation 03/12/2012  . Abdominal wall pain 03/12/2012    Past Surgical History:  Procedure Laterality Date  . CARPAL TUNNEL RELEASE     left  . ESOPHAGOGASTRODUODENOSCOPY  03/2012   SLF: gastritis  . PARTIAL HYSTERECTOMY      OB History    No data available       Home Medications    Prior to Admission medications   Medication Sig Start Date End Date Taking? Authorizing Provider  amitriptyline (ELAVIL) 25 MG tablet Take 2 tablets (50 mg total) by mouth at bedtime. 03/17/14   Penumalli, Earlean Polka, MD  amoxicillin (AMOXIL) 500 MG capsule Take 1 capsule (500 mg total) by mouth 3 (three) times daily. 12/22/15   Lily Kocher, PA-C  cephALEXin (KEFLEX) 500 MG capsule Take 1 capsule (500 mg total) 4 (four) times  daily by mouth. 11/14/16   Milton Ferguson, MD  clindamycin (CLEOCIN) 300 MG capsule Take 1 capsule (300 mg total) by mouth every 6 (six) hours. 09/24/15   Evalee Jefferson, PA-C  gabapentin (NEURONTIN) 300 MG capsule Take 1 capsule (300 mg total) by mouth 3 (three) times daily. 03/17/14   Penumalli, Earlean Polka, MD  HYDROcodone-acetaminophen (NORCO/VICODIN) 5-325 MG tablet Take 1 tablet by mouth every 4 (four) hours as needed. 09/24/15   Evalee Jefferson, PA-C  ibuprofen (ADVIL,MOTRIN) 600 MG tablet Take 1 tablet (600 mg total) by mouth 4 (four) times daily. 12/22/15   Lily Kocher, PA-C  insulin aspart (NOVOLOG) 100 UNIT/ML injection Inject 10 Units into the skin 3 (three) times daily before meals. At breakfast, lunch and dinner    [provider]  insulin detemir (LEVEMIR) 100 UNIT/ML injection Inject into the skin every morning.    [provider]  meloxicam (MOBIC) 15 MG tablet Take 15 mg by mouth daily. with food 01/15/14   [provider]  omeprazole (PRILOSEC) 20 MG capsule Take 20 mg by mouth daily. 1 po bid 30 minutes before meals for 3 mos then once daily FOR THE NEXT YEAR 04/02/12   Danie Binder, MD  ONE [facility] ULTRA TEST test strip  02/04/14   [provider]  polyethylene glycol (MIRALAX / GLYCOLAX) packet Take 17 g by  mouth as needed.     [provider]  traMADol (ULTRAM) 50 MG tablet Take 1 tablet (50 mg total) every 6 (six) hours as needed by mouth. 11/14/16   Milton Ferguson, MD  Vitamin D, Ergocalciferol, (DRISDOL) 50000 UNITS CAPS capsule Take 50,000 Units by mouth 2 (two) times a week.  03/20/14   [provider]    Family History Family History  Problem Relation Age of Onset  . Stroke Mother   . Lupus Sister   . Stroke Brother   . Diabetes Father     Social History Social History   Tobacco Use  . Smoking status: Never Smoker  . Smokeless tobacco: Never Used  Substance Use Topics  . Alcohol use: No    Alcohol/week: 0.0 oz  .  Drug use: No     Allergies   Other; Iodine; and Neosporin [neomycin-bacitracin zn-polymyx]   Review of Systems Review of Systems  Constitutional: Negative for appetite change and fatigue.  HENT: Negative for congestion, ear discharge and sinus pressure.   Eyes: Negative for discharge.  Respiratory: Negative for cough.   Cardiovascular: Negative for chest pain.  Gastrointestinal: Positive for abdominal pain. Negative for diarrhea.  Genitourinary: Negative for frequency and hematuria.  Musculoskeletal: Negative for back pain.  Skin: Negative for rash.  Neurological: Negative for seizures and headaches.  Psychiatric/Behavioral: Negative for hallucinations.     Physical Exam Updated Vital Signs BP 131/79 (BP Location: Right Arm)   Pulse 75   Temp 98.2 F (36.8 C) (Oral)   Resp 16   Ht 5\' 6"  (1.676 m)   Wt 70.8 kg (156 lb)   SpO2 100%   BMI 25.18 kg/m   Physical Exam  Constitutional: She is oriented to person, place, and time. She appears well-developed.  HENT:  Head: Normocephalic.  Eyes: Conjunctivae and EOM are normal. No scleral icterus.  Neck: Neck supple. No thyromegaly present.  Cardiovascular: Normal rate and regular rhythm. Exam reveals no gallop and no friction rub.  No murmur heard. Pulmonary/Chest: No stridor. She has no wheezes. She has no rales. She exhibits no tenderness.  Abdominal: She exhibits no distension. There is tenderness. There is no rebound.  Tender left upper quadrant  Musculoskeletal: Normal range of motion. She exhibits no edema.  Lymphadenopathy:    She has no cervical adenopathy.  Neurological: She is oriented to person, place, and time. She exhibits normal muscle tone. Coordination normal.  Skin: No rash noted. No erythema.  Psychiatric: She has a normal mood and affect. Her behavior is normal.     ED Treatments / Results  Labs (all labs ordered are listed, but only abnormal results are displayed) Labs Reviewed  COMPREHENSIVE  METABOLIC PANEL - Abnormal; Notable for the following components:      Result Value   Potassium 3.2 (*)    Glucose, Bld 224 (*)    AST 13 (*)    ALT 11 (*)    All other components within normal limits  URINALYSIS, ROUTINE W REFLEX MICROSCOPIC - Abnormal; Notable for the following components:   APPearance CLOUDY (*)    Glucose, UA >=500 (*)    Hgb urine dipstick MODERATE (*)    Protein, ur 100 (*)    Leukocytes, UA LARGE (*)    Bacteria, UA MANY (*)    All other components within normal limits  URINE CULTURE  LIPASE, BLOOD  CBC WITH DIFFERENTIAL/PLATELET    EKG  EKG Interpretation None       Radiology  Ct Abdomen Pelvis W Contrast  Result Date: 11/14/2016 CLINICAL DATA:  56 year old female with history of left upper abdominal pain for the past 2 weeks. EXAM: CT ABDOMEN AND PELVIS WITH CONTRAST TECHNIQUE: Multidetector CT imaging of the abdomen and pelvis was performed using the standard protocol following bolus administration of intravenous contrast. CONTRAST:  116mL ISOVUE-300 IOPAMIDOL (ISOVUE-300) INJECTION 61% COMPARISON:  CT the abdomen and pelvis 09/04/2012. FINDINGS: Lower chest: Unremarkable. Hepatobiliary: No cystic or solid hepatic lesions. No intra or extrahepatic biliary ductal dilatation. Gallbladder is normal in appearance. Pancreas: No pancreatic mass. No pancreatic ductal dilatation. No pancreatic or peripancreatic fluid or inflammatory changes. Spleen: Unremarkable. Adrenals/Urinary Tract: Left kidney and bilateral adrenal glands are normal in appearance. There are areas of hypoperfusion in the right kidney, most evident in the lower pole posteriorly, concerning for acute pyelonephritis. No perinephric fluid collection. No hydroureteronephrosis. Urinary bladder is unremarkable in appearance. Stomach/Bowel: Normal appearance of the stomach. No pathologic dilatation of small bowel or colon. Normal appendix. Vascular/Lymphatic: Aortic atherosclerosis, without evidence of  aneurysm or dissection in the abdominal or pelvic vasculature. Mildly enlarged 1 cm short axis enhancing right perinephric lymph node (axial image 24 of series 2). No other lymphadenopathy noted elsewhere in the abdomen or pelvis. Reproductive: Status post hysterectomy.  Ovaries are atrophic. Other: No significant volume of ascites.  No pneumoperitoneum. Musculoskeletal: There are no aggressive appearing lytic or blastic lesions noted in the visualized portions of the skeleton. IMPRESSION: 1. Areas of hypoperfusion in the right kidney most apparent in the lower pole, highly concerning for acute pyelonephritis. Correlation with urinalysis is recommended. 2. No other acute findings are noted in the abdomen or pelvis. 3. Aortic atherosclerosis. Aortic Atherosclerosis (ICD10-I70.0). Electronically Signed   By: Vinnie Langton M.D.   On: 11/14/2016 21:24    Procedures Procedures (including critical care time)  Medications Ordered in ED Medications  cephALEXin (KEFLEX) capsule 500 mg (not administered)  iopamidol (ISOVUE-300) 61 % injection 100 mL (100 mLs Intravenous Contrast Given 11/14/16 2102)     Initial Impression / Assessment and Plan / ED Course  I have reviewed the triage vital signs and the nursing notes.  Pertinent labs & imaging results that were available during my care of the patient were reviewed by me and considered in my medical decision making (see chart for details).     Patient with urinary tract infection she will be treated with Keflex and Ultram and will follow-up with PCP  Final Clinical Impressions(s) / ED Diagnoses   Final diagnoses:  Lower urinary tract infectious disease    ED Discharge Orders        Ordered    cephALEXin (KEFLEX) 500 MG capsule  4 times daily     11/14/16 2142    traMADol (ULTRAM) 50 MG tablet  Every 6 hours PRN     11/14/16 2142       Milton Ferguson, MD 11/14/16 2150

## 2016-11-14 NOTE — ED Triage Notes (Signed)
Left upper abdomen pain for 2 weeks.

## 2016-11-14 NOTE — ED Notes (Signed)
Pt alert & oriented x4, stable gait. Patient given discharge instructions, paperwork & prescription(s). Patient  instructed to stop at the registration desk to finish any additional paperwork. Patient verbalized understanding. Pt left department w/ no further questions. 

## 2016-11-14 NOTE — Discharge Instructions (Signed)
Follow-up with your doctor next week for recheck.  Drink plenty of fluids °

## 2016-11-18 LAB — URINE CULTURE: Culture: >=100000 — AB

## 2016-11-19 ENCOUNTER — Telehealth: Payer: Self-pay

## 2016-11-19 NOTE — Telephone Encounter (Signed)
Post ED Visit - Positive Culture Follow-up  Culture report reviewed by antimicrobial stewardship pharmacist:  []  Elenor Quinones, Pharm.D. []  Heide Guile, Pharm.D., BCPS AQ-ID []  Parks Neptune, Pharm.D., BCPS []  Alycia Rossetti, Pharm.D., BCPS []  [place], Pharm.D., BCPS, AAHIVP []  Legrand Como, Pharm.D., BCPS, AAHIVP [x]  Salome Arnt, PharmD, BCPS []  Dimitri Ped, PharmD, BCPS []  Vincenza Hews, PharmD, BCPS  Positive urine culture Treated with Cephalexin, organism sensitive to the same and no further patient follow-up is required at this time.  Genia Del 11/19/2016, 11:06 AM

## 2016-11-21 ENCOUNTER — Encounter: Payer: Self-pay | Admitting: *Deleted

## 2016-11-21 ENCOUNTER — Encounter: Payer: Self-pay | Admitting: Gastroenterology

## 2016-11-21 ENCOUNTER — Ambulatory Visit (INDEPENDENT_AMBULATORY_CARE_PROVIDER_SITE_OTHER): Payer: BLUE CROSS/BLUE SHIELD | Admitting: Gastroenterology

## 2016-11-21 ENCOUNTER — Other Ambulatory Visit: Payer: Self-pay | Admitting: *Deleted

## 2016-11-21 VITALS — BP 146/99 | HR 101 | Temp 97.9°F | Ht 67.0 in | Wt 163.6 lb

## 2016-11-21 DIAGNOSIS — R109 Unspecified abdominal pain: Secondary | ICD-10-CM

## 2016-11-21 DIAGNOSIS — K219 Gastro-esophageal reflux disease without esophagitis: Secondary | ICD-10-CM | POA: Diagnosis not present

## 2016-11-21 DIAGNOSIS — R6881 Early satiety: Secondary | ICD-10-CM | POA: Diagnosis not present

## 2016-11-21 DIAGNOSIS — R1010 Upper abdominal pain, unspecified: Secondary | ICD-10-CM

## 2016-11-21 MED ORDER — PANTOPRAZOLE SODIUM 40 MG PO TBEC: 40.0000 mg | DELAYED_RELEASE_TABLET | Freq: Every day | ORAL | 11 refills | Status: DC

## 2016-11-21 NOTE — Progress Notes (Signed)
No pcp per patient 

## 2016-11-21 NOTE — Patient Instructions (Addendum)
1. Stop omeprazole. Start pantoprazole 40mg  daily, 30 minutes before breakfast for heartburn and stomach pain.  2. IF YOU DECIDE YOU WANT TO RESTART AMITRIPTYLINE AT BEDTIME FOR YOUR STOMACH, LET ME KNOW. 3. Try one capful of Miralax at bedtime on days you do not have a good BM. 4. Upper endoscopy as scheduled. See separate instructions.  5. PLEASE FIND A NEW PCP. YOU NEED TO STAY UP TO DATE ON YOUR MAMMOGRAMS GIVEN YOUR FAMILY HISTORY OF BREAST CANCER.

## 2016-11-21 NOTE — Progress Notes (Signed)
Primary Care Physician:  Patient, No Pcp Per  Primary Gastroenterologist:  Barney Drain, MD   Chief Complaint  Patient presents with  . Abdominal Pain    HPI:  Brianna [name] is a 56 y.o. female here for further evaluation of abdominal pain.  She was last seen in March 2016.  Patient complains of 3-week history of abdominal pain.  Symptoms started in the left upper quadrant and now radiating into both upper quadrants.  At first was was intermittent.  Now she has a constant pain with periods of increased intensity.  Described as cutting, sharp pain.  Unaffected by meals.  Unaffected by bowel movements.  Just they are.  She is having difficult time controlling reflux.  A lot of heartburn.  Complains of early satiety.  States her sugars are difficult to control but does not know what her last A1c was.  Denies vomiting, dysphagia, weight loss.  Weight is actually up about 20 pounds in the past year.  Initially told me that her bowel movements were more regular but by the end of the interview she states that she has [place] 1 through 3, stools are irregular.  No melena or rectal bleeding.  He was seen in the ED for the left upper quadrant pain back on November 12th.  She denied dysuria but had a strong odor to her urine.  Urinalysis was positive, urine culture grew greater than 100,000 colony-forming units of E. coli.  CT was suggestive of right pyelonephritis.  Patient has been on Keflex.  She states her abdominal pain is unchanged but her urine odor has improved.  Patient also tells me that she recently ran out of her omeprazole.  She also tells me that she ran out of her Elavil about a month ago which she was on for chronic abdominal pain.  She had been doing very well and actually weaned herself down to 1 a day until she ran out.  She has been using ibuprofen 400 mg twice a day for abdominal pain since this left upper quadrant pain began.  She tells me she lost her PCP, he left the practice.   She is overdue for mammogram, she worries about breast cancer given her family history of her mother having breast cancer.  Current Outpatient Medications  Medication Sig Dispense Refill  . amitriptyline (ELAVIL) 25 MG tablet Take 2 tablets (50 mg total) by mouth at bedtime. 60 tablet 6  . cephALEXin (KEFLEX) 500 MG capsule Take 1 capsule (500 mg total) 4 (four) times daily by mouth. 28 capsule 0  . gabapentin (NEURONTIN) 300 MG capsule Take 1 capsule (300 mg total) by mouth 3 (three) times daily. 90 capsule 6  . insulin aspart (NOVOLOG) 100 UNIT/ML injection Inject 20 Units 3 (three) times daily before meals into the skin. At breakfast, lunch and dinner     . omeprazole (PRILOSEC) 20 MG capsule Take 20 mg daily by mouth.     . ONE TOUCH ULTRA TEST test strip   11  . polyethylene glycol (MIRALAX / GLYCOLAX) packet Take 17 g by mouth as needed.      No current facility-administered medications for this visit.     Allergies as of 11/21/2016 - Review Complete 11/21/2016  Allergen Reaction Noted  . Other Hives and Shortness Of Breath 03/03/2012  . Iodine Hives 03/03/2012  . Neosporin [neomycin-bacitracin zn-polymyx] Rash 03/03/2012    Past Medical History:  Diagnosis Date  . Chronic abdominal pain   . Gastritis   .  Heartburn   . Type 2 diabetes mellitus with diabetic neuropathy [facility])     Past Surgical History:  Procedure Laterality Date  . CARPAL TUNNEL RELEASE     left  . COLONOSCOPY WITH ESOPHAGOGASTRODUODENOSCOPY (EGD) N/A 04/02/2012   Performed by Danie Binder, MD at [place]  . ESOPHAGOGASTRODUODENOSCOPY  03/2012   SLF: gastritis  . PARTIAL HYSTERECTOMY    . TRIGGER FINGER RELEASE Left     Family History  Problem Relation Age of Onset  . Stroke Mother   . Lupus Sister   . Stroke Brother   . Diabetes Father   Breast cancer, mother.  Social History   Socioeconomic History  . Marital status: Married    Spouse name: Not on file  . Number of children: 2  .  Years of education: Not on file  . Highest education level: Not on file  Social Needs  . Financial resource strain: Not on file  . Food insecurity - worry: Not on file  . Food insecurity - inability: Not on file  . Transportation needs - medical: Not on file  . Transportation needs - non-medical: Not on file  Occupational History  . 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 Smoking status: Never Smoker  . Smokeless tobacco: Never Used  Substance and Sexual Activity  . Alcohol use: No    Alcohol/week: 0.0 oz  . Drug use: No  . Sexual activity: Not on file  Other Topics Concern  . Not on file  Social History Narrative   Lives w/ husband 2 kids-17/20 healthy      ROS:  General: Negative for anorexia, weight loss, fever, chills, fatigue, weakness. Eyes: Negative for vision changes.  ENT: Negative for hoarseness, difficulty swallowing , nasal congestion. CV: Negative for chest pain, angina, palpitations, dyspnea on exertion, peripheral edema.  Respiratory: Negative for dyspnea at rest, dyspnea on exertion, cough, sputum, wheezing.  GI: See history of present illness. GU:  Negative for dysuria, hematuria, urinary incontinence, urinary frequency, nocturnal urination.  MS: Negative for joint pain, low back pain.  Derm: Negative for rash or itching.  Neuro: Negative for weakness, abnormal sensation, seizure, frequent headaches, memory loss, confusion.  Psych: Negative for anxiety, depression, suicidal ideation, hallucinations.  Endo: Negative for unusual weight change.  Heme: Negative for bruising or bleeding. Allergy: Negative for rash or hives.    Physical Examination:  BP (!) 146/99   Pulse (!) 101   Temp 97.9 F (36.6 C) (Oral)   Ht 5\' 7"  (1.702 m)   Wt 163 lb 9.6 oz (74.2 kg)   BMI 25.62 kg/m    General: Well-nourished, well-developed in no acute distress.  Head: Normocephalic, atraumatic.   Eyes: Conjunctiva pink, no icterus. Mouth:  Oropharyngeal mucosa moist and pink , no lesions erythema or exudate. Neck: Supple without thyromegaly, masses, or lymphadenopathy.  Lungs: Clear to auscultation bilaterally.  Heart: Regular rate and rhythm, no murmurs rubs or gallops.  Abdomen: Bowel sounds are normal, mild left upper quadrant tenderness to deep palpation, mild epigastric tenderness.  She also has some tenderness with palpation of the left lower rib cage margin., nondistended, no hepatosplenomegaly or masses, no abdominal bruits or    hernia , no rebound or guarding.   Rectal: Not performed. Extremities: No lower extremity edema. No clubbing or deformities.  Neuro: Alert and oriented x 4 , grossly normal neurologically.  Skin: Warm and dry, no rash or jaundice.   Psych: Alert and  cooperative, normal mood and affect.  Labs: Lab Results  Component Value Date   CREATININE 0.54 11/14/2016   BUN 15 11/14/2016   NA 137 11/14/2016   K 3.2 (L) 11/14/2016   CL 101 11/14/2016   CO2 30 11/14/2016   Lab Results  Component Value Date   ALT 11 (L) 11/14/2016   AST 13 (L) 11/14/2016   ALKPHOS 115 11/14/2016   BILITOT 0.3 11/14/2016   Lab Results  Component Value Date   WBC 6.5 11/14/2016   HGB 12.5 11/14/2016   HCT 37.8 11/14/2016   MCV 83.1 11/14/2016   PLT 366 11/14/2016   Lab Results  Component Value Date   LIPASE 25 11/14/2016     Imaging Studies: Ct Abdomen Pelvis W Contrast  Result Date: 11/14/2016 CLINICAL DATA:  56 year old female with history of left upper abdominal pain for the past 2 weeks. EXAM: CT ABDOMEN AND PELVIS WITH CONTRAST TECHNIQUE: Multidetector CT imaging of the abdomen and pelvis was performed using the standard protocol following bolus administration of intravenous contrast. CONTRAST:  179mL ISOVUE-300 IOPAMIDOL (ISOVUE-300) INJECTION 61% COMPARISON:  CT the abdomen and pelvis 09/04/2012. FINDINGS: Lower chest: Unremarkable. Hepatobiliary: No cystic or solid hepatic lesions. No intra or  extrahepatic biliary ductal dilatation. Gallbladder is normal in appearance. Pancreas: No pancreatic mass. No pancreatic ductal dilatation. No pancreatic or peripancreatic fluid or inflammatory changes. Spleen: Unremarkable. Adrenals/Urinary Tract: Left kidney and bilateral adrenal glands are normal in appearance. There are areas of hypoperfusion in the right kidney, most evident in the lower pole posteriorly, concerning for acute pyelonephritis. No perinephric fluid collection. No hydroureteronephrosis. Urinary bladder is unremarkable in appearance. Stomach/Bowel: Normal appearance of the stomach. No pathologic dilatation of small bowel or colon. Normal appendix. Vascular/Lymphatic: Aortic atherosclerosis, without evidence of aneurysm or dissection in the abdominal or pelvic vasculature. Mildly enlarged 1 cm short axis enhancing right perinephric lymph node (axial image 24 of series 2). No other lymphadenopathy noted elsewhere in the abdomen or pelvis. Reproductive: Status post hysterectomy.  Ovaries are atrophic. Other: No significant volume of ascites.  No pneumoperitoneum. Musculoskeletal: There are no aggressive appearing lytic or blastic lesions noted in the visualized portions of the skeleton. IMPRESSION: 1. Areas of hypoperfusion in the right kidney most apparent in the lower pole, highly concerning for acute pyelonephritis. Correlation with urinalysis is recommended. 2. No other acute findings are noted in the abdomen or pelvis. 3. Aortic atherosclerosis. Aortic Atherosclerosis (ICD10-I70.0). Electronically Signed   By: Vinnie Langton M.D.   On: 11/14/2016 21:24   Impression/Plan: 56 year old female with history of chronic abdominal pain, GERD who presents with 3-week history of left upper quadrant pain initially, now radiating throughout the upper abdomen.  Associated with early satiety, refractory heartburn.  She also has constipation.  Recent ED evaluation with CT scan, labs, urine showed acute  pyelonephritis, right kidney.  Upper abdominal pain not improved on antibiotic therapy. She has a history of difficult to control diabetes as well.  Of note she came off her Elavil which has worked nicely in the past for chronic abdominal pain although she feels like this pain is different.  Given refractory GERD, early satiety, persisting upper abdominal pain would offer her upper endoscopy for further evaluation.  I have discussed the risks, alternatives, benefits with regards to but not limited to the risk of reaction to medication, bleeding, infection, perforation and the patient is agreeable to proceed. Written consent to be obtained.  Switch PPI from omeprazole to pantoprazole 40 mg 30 minutes  before breakfast daily.  Limit NSAID use, currently taking 400 mg of ibuprofen twice daily as she does not tolerate tramadol for pain.  Optimize bowel function, use MiraLAX 17 g at bedtime on days that she does not have an adequate BM.  She has a tendency towards diarrhea with bowel regimens therefore utilize MiraLAX for now.  Discussed going back on Elavil but she is not ready at this point.  She will let me know if she changes her mind.  Reinforced that she needs to find a PCP.  She is overdue for mammogram per her report. FH of breast cancer, first degree relative.

## 2016-12-09 ENCOUNTER — Encounter (HOSPITAL_COMMUNITY)
Admission: RE | Disposition: A | Discharge status: Home / Self Care | Payer: Self-pay | Source: Ambulatory Visit | Attending: Gastroenterology

## 2016-12-09 ENCOUNTER — Ambulatory Visit (HOSPITAL_COMMUNITY)
Admission: RE | Admit: 2016-12-09 | Discharge: 2016-12-09 | Disposition: A | Discharge status: Home / Self Care | Payer: BLUE CROSS/BLUE SHIELD | Source: Ambulatory Visit | Attending: Gastroenterology | Admitting: Gastroenterology

## 2016-12-09 ENCOUNTER — Encounter (HOSPITAL_COMMUNITY): Payer: Self-pay | Admitting: *Deleted

## 2016-12-09 ENCOUNTER — Other Ambulatory Visit: Payer: Self-pay

## 2016-12-09 DIAGNOSIS — Z794 Long term (current) use of insulin: Secondary | ICD-10-CM | POA: Diagnosis not present

## 2016-12-09 DIAGNOSIS — K449 Diaphragmatic hernia without obstruction or gangrene: Secondary | ICD-10-CM | POA: Insufficient documentation

## 2016-12-09 DIAGNOSIS — E1140 Type 2 diabetes mellitus with diabetic neuropathy, unspecified: Secondary | ICD-10-CM | POA: Insufficient documentation

## 2016-12-09 DIAGNOSIS — K311 Adult hypertrophic pyloric stenosis: Secondary | ICD-10-CM | POA: Diagnosis not present

## 2016-12-09 DIAGNOSIS — R109 Unspecified abdominal pain: Secondary | ICD-10-CM

## 2016-12-09 DIAGNOSIS — Z79899 Other long term (current) drug therapy: Secondary | ICD-10-CM | POA: Insufficient documentation

## 2016-12-09 DIAGNOSIS — R6881 Early satiety: Secondary | ICD-10-CM | POA: Insufficient documentation

## 2016-12-09 DIAGNOSIS — R1032 Left lower quadrant pain: Secondary | ICD-10-CM

## 2016-12-09 DIAGNOSIS — K219 Gastro-esophageal reflux disease without esophagitis: Secondary | ICD-10-CM

## 2016-12-09 HISTORY — PX: ESOPHAGEAL DILATION: SHX303

## 2016-12-09 HISTORY — PX: ESOPHAGOGASTRODUODENOSCOPY: SHX5428

## 2016-12-09 LAB — GLUCOSE, CAPILLARY
Glucose-Capillary: 358 mg/dL — ABNORMAL HIGH (ref 65–99)
Glucose-Capillary: 295 mg/dL — ABNORMAL HIGH (ref 65–99)

## 2016-12-09 SURGERY — EGD (ESOPHAGOGASTRODUODENOSCOPY)
Anesthesia: Moderate Sedation

## 2016-12-09 MED ORDER — MEPERIDINE HCL 100 MG/ML IJ SOLN
INTRAMUSCULAR | Status: DC | PRN
  Administered 2016-12-09: 50 mg via INTRAVENOUS
  Administered 2016-12-09: 25 mg via INTRAVENOUS

## 2016-12-09 MED ORDER — AMITRIPTYLINE HCL 50 MG PO TABS: ORAL_TABLET | ORAL | 11 refills | Status: DC

## 2016-12-09 MED ORDER — SODIUM CHLORIDE 0.9 % IV SOLN: INTRAVENOUS | Status: DC

## 2016-12-09 MED ORDER — SODIUM CHLORIDE 0.9 % IR SOLN: Status: DC | PRN

## 2016-12-09 MED ORDER — LIDOCAINE VISCOUS 2 % MT SOLN
OROMUCOSAL | Status: DC | PRN
  Administered 2016-12-09: 1 via OROMUCOSAL

## 2016-12-09 MED ORDER — MIDAZOLAM HCL 5 MG/5ML IJ SOLN
INTRAMUSCULAR | Status: AC
  Filled 2016-12-09: qty 10

## 2016-12-09 MED ORDER — LIDOCAINE VISCOUS 2 % MT SOLN
OROMUCOSAL | Status: AC
  Filled 2016-12-09: qty 15

## 2016-12-09 MED ORDER — MEPERIDINE HCL 100 MG/ML IJ SOLN
INTRAMUSCULAR | Status: AC
  Filled 2016-12-09: qty 2

## 2016-12-09 MED ORDER — MIDAZOLAM HCL 5 MG/5ML IJ SOLN
INTRAMUSCULAR | Status: DC | PRN
  Administered 2016-12-09 (×2): 2 mg via INTRAVENOUS

## 2016-12-09 MED ORDER — STERILE WATER FOR IRRIGATION IR SOLN
Status: DC | PRN
  Administered 2016-12-09: 11:00:00

## 2016-12-09 MED ORDER — INSULIN ASPART 100 UNIT/ML IV SOLN
6.0000 [IU] | Freq: Once | INTRAVENOUS | Status: AC
  Administered 2016-12-09: 6 [IU] via INTRAVENOUS
  Filled 2016-12-09: qty 0.06

## 2016-12-09 MED ORDER — SODIUM CHLORIDE 0.9 % IV SOLN
INTRAVENOUS | Status: AC | PRN
  Administered 2016-12-09: 500 mL via INTRAMUSCULAR

## 2016-12-09 NOTE — H&P (Signed)
Primary Care Physician:  Patient, No Pcp Per Primary Gastroenterologist:  Dr. Oneida Alar  Pre-Procedure History & Physical: HPI:  Brianna [name] is a 56 y.o. female here for DYSPEPSIA OFF OMEPRAZOLE/ELAVIL AND TAKING IBUPROFEN.  Past Medical History:  Diagnosis Date  . Chronic abdominal pain   . Gastritis   . Heartburn   . Type 2 diabetes mellitus with diabetic neuropathy [facility])     Past Surgical History:  Procedure Laterality Date  . CARPAL TUNNEL RELEASE     left  . COLONOSCOPY WITH ESOPHAGOGASTRODUODENOSCOPY (EGD) N/A 04/02/2012   SLF: SMALL RECTAL polyp/Small internal hemorrhoids. Polyp hyperplastic. On EGD she had mild gastritis. Biopsy showed chronic and active gastritis and no H. pylori. Next colonoscopy in 10 years with overtube/? Propofol  . ESOPHAGOGASTRODUODENOSCOPY  03/2012   SLF: gastritis  . PARTIAL HYSTERECTOMY    . TRIGGER FINGER RELEASE Left     Prior to Admission medications   Medication Sig Start Date End Date Taking? Authorizing Provider  Canagliflozin-Metformin HCl (INVOKAMET) 50-1000 MG TABS Take 1 tablet by mouth every evening.   Yes [provider]  Cholecalciferol 50000 units capsule Take 50,000 Units by mouth every 7 (seven) days. Thursday   Yes [provider]  diclofenac (FLECTOR) 1.3 % PTCH Place 1 patch onto the skin daily as needed (PAIN).   Yes [provider]  gabapentin (NEURONTIN) 300 MG capsule Take 1 capsule (300 mg total) by mouth 3 (three) times daily. Patient taking differently: Take 300 mg by mouth 2 (two) times daily.  03/17/14  Yes Penumalli, Earlean Polka, MD  ibuprofen (ADVIL,MOTRIN) 800 MG tablet Take 800 mg by mouth every 8 (eight) hours as needed for mild pain.   Yes [provider]  Insulin Glargine (BASAGLAR KWIKPEN) 100 UNIT/ML SOPN Inject 40 Units into the skin at bedtime.   Yes [provider]  pantoprazole (PROTONIX) 40 MG tablet Take 1 tablet (40 mg total) daily before breakfast by  mouth. 11/21/16  Yes Mahala Menghini, PA-C  polyethylene glycol (MIRALAX / GLYCOLAX) packet Take 17 g by mouth daily as needed for mild constipation.    Yes [provider]  acetaminophen (TYLENOL) 500 MG tablet Take 500 mg by mouth every 6 (six) hours as needed for moderate pain.    [provider]  amitriptyline (ELAVIL) 25 MG tablet Take 2 tablets (50 mg total) by mouth at bedtime. Patient not taking: Reported on 12/06/2016 03/17/14   Penumalli, Earlean Polka, MD  cephALEXin (KEFLEX) 500 MG capsule Take 1 capsule (500 mg total) 4 (four) times daily by mouth. Patient not taking: Reported on 12/06/2016 11/14/16   Milton Ferguson, MD  ONE TOUCH ULTRA TEST test strip  02/04/14   [provider]    Allergies as of 11/21/2016 - Review Complete 11/21/2016  Allergen Reaction Noted  . Other Hives and Shortness Of Breath 03/03/2012  . Iodine Hives 03/03/2012  . Neosporin [neomycin-bacitracin zn-polymyx] Rash 03/03/2012    Family History  Problem Relation Age of Onset  . Stroke Mother   . Breast cancer Mother   . Lupus Sister   . Stroke Brother   . Diabetes Father     Social History   Socioeconomic History  . Marital status: Married    Spouse name: Not on file  . Number of children: 2  . Years of education: Not on file  . Highest education level: Not on file  Social Needs  . Financial resource strain: Not on file  . Food insecurity -  worry: Not on file  . Food insecurity - inability: Not on file  . Transportation needs - medical: Not on file  . Transportation needs - non-medical: Not on file  Occupational History  . 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 Smoking status: Never Smoker  . Smokeless tobacco: Never Used  Substance and Sexual Activity  . Alcohol use: No    Alcohol/week: 0.0 oz  . Drug use: No  . Sexual activity: Not on file  Other Topics Concern  . Not on file  Social History Narrative   Lives w/ husband 2  kids-17/20 healthy    Review of Systems: See HPI, otherwise negative ROS   Physical Exam: BP (!) 141/82   Pulse 72   Temp 98.5 F (36.9 C) (Oral)   Resp 18   Ht 5\' 7"  (1.702 m)   Wt 163 lb (73.9 kg)   SpO2 99%   BMI 25.53 kg/m  General:   Alert,  pleasant and cooperative in NAD Head:  Normocephalic and atraumatic. Neck:  Supple; Lungs:  Clear throughout to auscultation.    Heart:  Regular rate and rhythm. Abdomen:  Soft, nontender and nondistended. Normal bowel sounds, without guarding, and without rebound.   Neurologic:  Alert and  oriented x4;  grossly normal neurologically.  Impression/Plan:     DYSPEPSIA  PLAN:  EGD TODAY DISCUSSED PROCEDURE, BENEFITS, & RISKS: < 1% chance of medication reaction, bleeding, OR perforation.

## 2016-12-09 NOTE — Discharge Instructions (Signed)
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 THIS CAN CAUSE NAUSEA, VOMITING,  UPPER ABDOMINAL PAIN AND MAKE YOU FEEL FULL FAST. You have a small hiatal hernia.   CONTINUE YOUR EFFORTS TO IDEALLY CONTROL YOUR BLOOD SUGARS.  DRINK WATER TO KEEP YOUR URINE LIGHT YELLOW.  FOLLOW A LOW FAT/DIABETIC DIET.  MEATS SHOULD BE BAKED, BROILED, OR BOILED. AVOID FRIED FOODS. SEE INFO BELOW.  TO CONTROL REFLUX AND PREVENT GASTRITIS, CONTINUE PROTONIX. TAKE 30 MINUTES PRIOR TO BREAKFAST.   RE-START ELAVIL 50 MG AT BEDTIME.  FOLLOW UP APPT IN 4 MONTHS.    UPPER ENDOSCOPY AFTER CARE Read the instructions outlined below and refer to this sheet in the next week. These discharge instructions provide you with general information on caring for yourself after you leave the hospital. While your treatment has been planned according to the most current medical practices available, unavoidable complications occasionally occur. If you have any problems or questions after discharge, call DR. Alexio Sroka, [phone].  ACTIVITY  You may resume your regular activity, but move at a slower pace for the next 24 hours.   Take frequent rest periods for the next 24 hours.   Walking will help get rid of the air and reduce the bloated feeling in your belly (abdomen).   No driving for 24 hours (because of the medicine (anesthesia) used during the test).   You may shower.   Do not sign any important legal documents or operate any machinery for 24 hours (because of the anesthesia used during the test).    NUTRITION  Drink plenty of fluids.   You may resume your normal diet as instructed by your doctor.   Begin with a light meal and progress to your normal diet. Heavy or fried foods are harder to digest and may make you feel sick to your stomach (nauseated).   Avoid alcoholic beverages for 24 hours or as instructed.    MEDICATIONS  You may resume your normal medications.   WHAT YOU CAN EXPECT  TODAY  Some feelings of bloating in the abdomen.   Passage of more gas than usual.    IF YOU HAD A BIOPSY TAKEN DURING THE UPPER ENDOSCOPY:  Eat a soft diet IF YOU HAVE NAUSEA, BLOATING, ABDOMINAL PAIN, OR VOMITING.    FINDING OUT THE RESULTS OF YOUR TEST Not all test results are available during your visit. DR. Oneida Alar WILL CALL YOU WITHIN 14 DAYS OF YOUR PROCEDUE WITH YOUR RESULTS. Do not assume everything is normal if you have not heard from DR. Brandelyn Henne IN 14 DAYS, CALL HER OFFICE AT [phone].  SEEK IMMEDIATE MEDICAL ATTENTION AND CALL THE OFFICE: [phone] IF:  You have more than a spotting of blood in your stool.   Your belly is swollen (abdominal distention).   You are nauseated or vomiting.   You have a temperature over 101F.   You have abdominal pain or discomfort that is severe or gets worse throughout the day.   Pyloric Stenosis   Pyloric stenosis means that the opening (pylorus) out of the stomach is narrowed (stenosis). The narrowing is caused by an increase in the muscles of the pylorus. This increase in muscle results in a narrowing. This blocks food from passing out of the stomach into the small bowel.     Low-Fat Diet BREADS, CEREALS, PASTA, RICE, DRIED PEAS, AND BEANS These products are high in carbohydrates and most are low in fat. Therefore, they can be increased in the diet as substitutes for fatty  foods. They too, however, contain calories and should not be eaten in excess. Cereals can be eaten for snacks as well as for breakfast.   FRUITS AND VEGETABLES It is good to eat fruits and vegetables. Besides being sources of fiber, both are rich in vitamins and some minerals. They help you get the daily allowances of these nutrients. Fruits and vegetables can be used for snacks and desserts.  MEATS Limit lean meat, chicken, [place], and fish to no more than 6 ounces per day. Beef, Pork, and Lamb Use lean cuts of beef, pork, and lamb. Lean cuts include:    Extra-lean ground beef.  Arm roast.  Sirloin tip.  Center-cut ham.  Round steak.  Loin chops.  Rump roast.  Tenderloin.  Trim all fat off the outside of meats before cooking. It is not necessary to severely decrease the intake of red meat, but lean choices should be made. Lean meat is rich in protein and contains a highly absorbable form of iron. Premenopausal women, in particular, should avoid reducing lean red meat because this could increase the risk for low red blood cells (iron-deficiency anemia).  Chicken and [place] These are good sources of protein. The fat of poultry can be reduced by removing the skin and underlying fat layers before cooking. Chicken and [place] can be substituted for lean red meat in the diet. Poultry should not be fried or covered with high-fat sauces. Fish and Shellfish Fish is a good source of protein. Shellfish contain cholesterol, but they usually are low in saturated fatty acids. The preparation of fish is important. Like chicken and [place], they should not be fried or covered with high-fat sauces. EGGS Egg whites contain no fat or cholesterol. They can be eaten often. Try 1 to 2 egg whites instead of whole eggs in recipes or use egg substitutes that do not contain yolk. MILK AND DAIRY PRODUCTS Use skim or 1% milk instead of 2% or whole milk. Decrease whole milk, natural, and processed cheeses. Use nonfat or low-fat (2%) cottage cheese or low-fat cheeses made from vegetable oils. Choose nonfat or low-fat (1 to 2%) yogurt. Experiment with evaporated skim milk in recipes that call for heavy cream. Substitute low-fat yogurt or low-fat cottage cheese for sour cream in dips and salad dressings. Have at least 2 servings of low-fat dairy products, such as 2 glasses of skim (or 1%) milk each day to help get your daily calcium intake. FATS AND OILS Reduce the total intake of fats, especially saturated fat. Butterfat, lard, and beef fats are high in saturated fat and  cholesterol. These should be avoided as much as possible. Vegetable fats do not contain cholesterol, but certain vegetable fats, such as coconut oil, palm oil, and palm kernel oil are very high in saturated fats. These should be limited. These fats are often used in bakery goods, processed foods, popcorn, oils, and nondairy creamers. Vegetable shortenings and some peanut butters contain hydrogenated oils, which are also saturated fats. Read the labels on these foods and check for saturated vegetable oils. Unsaturated vegetable oils and fats do not raise blood cholesterol. However, they should be limited because they are fats and are high in calories. Total fat should still be limited to 30% of your daily caloric intake. Desirable liquid vegetable oils are corn oil, cottonseed oil, olive oil, canola oil, safflower oil, soybean oil, and sunflower oil. Peanut oil is not as good, but small amounts are acceptable. Buy a heart-healthy tub margarine that has no partially hydrogenated  oils in the ingredients. Mayonnaise and salad dressings often are made from unsaturated fats, but they should also be limited because of their high calorie and fat content. Seeds, nuts, peanut butter, olives, and avocados are high in fat, but the fat is mainly the unsaturated type. These foods should be limited mainly to avoid excess calories and fat. OTHER EATING TIPS Snacks  Most sweets should be limited as snacks. They tend to be rich in calories and fats, and their caloric content outweighs their nutritional value. Some good choices in snacks are graham crackers, melba toast, soda crackers, bagels (no egg), English muffins, fruits, and vegetables. These snacks are preferable to snack crackers, [place] fries, TORTILLA CHIPS, and POTATO chips. Popcorn should be air-popped or cooked in small amounts of liquid vegetable oil. Desserts Eat fruit, low-fat yogurt, and fruit ices instead of pastries, cake, and cookies. Sherbet, angel food  cake, gelatin dessert, frozen low-fat yogurt, or other frozen products that do not contain saturated fat (pure fruit juice bars, frozen ice pops) are also acceptable.  COOKING METHODS Choose those methods that use little or no fat. They include: Poaching.  Braising.  Steaming.  Grilling.  Baking.  Stir-frying.  Broiling.  Microwaving.  Foods can be cooked in a nonstick pan without added fat, or use a nonfat cooking spray in regular cookware. Limit fried foods and avoid frying in saturated fat. Add moisture to lean meats by using water, broth, cooking wines, and other nonfat or low-fat sauces along with the cooking methods mentioned above. Soups and stews should be chilled after cooking. The fat that forms on top after a few hours in the refrigerator should be skimmed off. When preparing meals, avoid using excess salt. Salt can contribute to raising blood pressure in some people.  EATING AWAY FROM HOME Order entres, potatoes, and vegetables without sauces or butter. When meat exceeds the size of a deck of cards (3 to 4 ounces), the rest can be taken home for another meal. Choose vegetable or fruit salads and ask for low-calorie salad dressings to be served on the side. Use dressings sparingly. Limit high-fat toppings, such as bacon, crumbled eggs, cheese, sunflower seeds, and olives. Ask for heart-healthy tub margarine instead of butter.

## 2016-12-09 NOTE — Progress Notes (Signed)
Dr. Oneida Alar stated that patient may leave at this time. Keflex was addressed with patient, she is finished with that medication.

## 2016-12-09 NOTE — Op Note (Signed)
[facility] Patient Name: Brianna [name] Procedure Date: 12/09/2016 10:35 AM MRN: [id number] Date of Birth: 12-Nov-1960 Attending MD: Barney Drain MD, MD CSN: [id number] Age: 56 Admit Type: Outpatient Procedure:                Upper GI endoscopy WITH PYLORIC DILATION Indications:              Abdominal pain in the left lower quadrant, Early                            satiety. WEIGHT UP 21 LBS SINCE 2016. BG TODAY 380. Providers:                Barney Drain MD, MD, Jeanann Lewandowsky. Sharon Seller, RN, Nelma Rothman, Technician, Aram Candela Referring MD:              Medicines:                Meperidine 75 mg IV, Midazolam 4 mg IV Complications:            No immediate complications. Estimated Blood Loss:     Estimated blood loss was minimal. Procedure:                Pre-Anesthesia Assessment:                           - Prior to the procedure, a History and Physical                            was performed, and patient medications and                            allergies were reviewed. The patient's tolerance of                            previous anesthesia was also reviewed. The risks                            and benefits of the procedure and the sedation                            options and risks were discussed with the patient.                            All questions were answered, and informed consent                            was obtained. Prior Anticoagulants: The patient has                            taken ibuprofen, last dose was 1 day prior to                            procedure. ASA Grade Assessment: II - A patient  with mild systemic disease. After reviewing the                            risks and benefits, the patient was deemed in                            satisfactory condition to undergo the procedure.                            After obtaining informed consent, the endoscope was                             passed under direct vision. Throughout the                            procedure, the patient's blood pressure, pulse, and                            oxygen saturations were monitored continuously. The                            EG-[id number] ([id number]) scope was introduced through the                            mouth, and advanced to the second part of duodenum.                            The upper GI endoscopy was somewhat difficult due                            to patient intolerance of esophageal intubation.                            The patient tolerated the procedure fairly well. Scope In: 11:11:21 AM Scope Out: 11:19:41 AM Total Procedure Duration: 0 hours 8 minutes 20 seconds  Findings:      The examined esophagus was normal.      A small hiatal hernia was present.      A benign-appearing, intrinsic moderate stenosis was found at the       pylorus. This was traversed. A TTS dilator was passed through the scope.       Dilation with a 12-13.5-15 mm pyloric balloon dilator was performed.      The examined duodenum was normal. Impression:               - Small hiatal hernia.                           - LUQ PAIN DUE TO GERD/GASTRITIS AND EARLY SATIETY                            DUE TO PYLORIC stenosis Moderate Sedation:      Moderate (conscious) sedation was administered by the endoscopy nurse       and supervised by the endoscopist. The following parameters were  monitored: oxygen saturation, heart rate, blood pressure, and response       to care. Total physician intraservice time was 24 minutes. Recommendation:           - Resume previous diet.                           - Continue present medications. PT NEEDS TO                            CONTINUE TO WORK ON GLUCOSE CONTROL.                           - Return to my office in 4 months.                           - Patient has a contact number available for                            emergencies. The signs and symptoms of potential                             delayed complications were discussed with the                            patient. Return to normal activities tomorrow.                            Written discharge instructions were provided to the                            patient. Procedure Code(s):        --- Professional ---                           [phone], Esophagogastroduodenoscopy, flexible,                            transoral; with dilation of gastric/duodenal                            stricture(s) (eg, balloon, bougie)                           99152, Moderate sedation services provided by the                            same physician or other qualified health care                            professional performing the diagnostic or                            therapeutic service that the sedation supports,                            requiring  the presence of an independent trained                            observer to assist in the monitoring of the                            patient's level of consciousness and physiological                            status; initial 15 minutes of intraservice time,                            patient age 46 years or older                           [phone], Moderate sedation services; each additional                            15 minutes intraservice time Diagnosis Code(s):        --- Professional ---                           K44.9, Diaphragmatic hernia without obstruction or                            gangrene                           K31.1, Adult hypertrophic pyloric stenosis                           R10.32, Left lower quadrant pain                           R68.81, Early satiety CPT copyright 2016 American Medical Association. All rights reserved. The codes documented in this report are preliminary and upon coder review may  be revised to meet current compliance requirements. Barney Drain, MD Barney Drain MD, MD 12/09/2016 11:41:48 AM This report has been  signed electronically. Number of Addenda: 0

## 2016-12-13 ENCOUNTER — Encounter (HOSPITAL_COMMUNITY): Payer: Self-pay | Admitting: Gastroenterology

## 2016-12-22 ENCOUNTER — Ambulatory Visit: Payer: BLUE CROSS/BLUE SHIELD | Admitting: Gastroenterology

## 2017-03-01 ENCOUNTER — Encounter: Payer: Self-pay | Admitting: Gastroenterology

## 2017-05-30 ENCOUNTER — Telehealth: Payer: Self-pay | Admitting: Gastroenterology

## 2017-05-30 NOTE — Telephone Encounter (Signed)
Need more clarification. Last seen in office Nov 2018 and did not want to resume at that time. Procedure Dec 2018. When did she start taking it again? She was last seen by Magda Paganini, per notes patient to let her know if she decides to start taking again. I've never officially seen patient. Routing back to [place] for more info and [place] as [place].

## 2017-05-30 NOTE — Telephone Encounter (Signed)
PATIENT NEEDS HER MEDICINE CALLED IN FOR A 90 DAY SUPPLY TO [facility] [place] ON [address]   AMITRIPTYLINE 50

## 2017-05-30 NOTE — Telephone Encounter (Signed)
Forwarding to refill box.  

## 2017-05-31 ENCOUNTER — Telehealth: Payer: Self-pay | Admitting: Gastroenterology

## 2017-05-31 MED ORDER — PANTOPRAZOLE SODIUM 40 MG PO TBEC: 40.0000 mg | DELAYED_RELEASE_TABLET | Freq: Every day | ORAL | 3 refills | Status: DC

## 2017-05-31 NOTE — Telephone Encounter (Signed)
Done

## 2017-05-31 NOTE — Addendum Note (Signed)
Addended by: Annitta Needs on: 05/31/2017 04:09 PM   Modules accepted: Orders

## 2017-05-31 NOTE — Telephone Encounter (Signed)
Patient called again inquiring if her refill had been sent in yet

## 2017-05-31 NOTE — Telephone Encounter (Signed)
Pt said she has not gone back on the amitriptyline. She meant to ask for refill on the pantoprazole and she needs a 90 day supply.

## 2017-06-01 MED ORDER — AMITRIPTYLINE HCL 10 MG PO TABS: ORAL_TABLET | ORAL | 3 refills | Status: DC

## 2017-06-01 NOTE — Addendum Note (Signed)
Addended by: Mahala Menghini on: 06/01/2017 09:49 AM   Modules accepted: Orders

## 2017-06-01 NOTE — Telephone Encounter (Signed)
rx given. She needs to keep ov with slf.

## 2017-06-01 NOTE — Telephone Encounter (Signed)
Pt is aware and said she will keep appt with Dr. Oneida Alar in August.

## 2017-06-01 NOTE — Telephone Encounter (Signed)
PT is aware her Pantoprazole had been refilled. Now, she has decided she wants to go back on the Amitriptyline. Needs refills and is asking for 90 day supply. Forwarding to Neil Crouch, PA.

## 2017-08-16 ENCOUNTER — Ambulatory Visit (INDEPENDENT_AMBULATORY_CARE_PROVIDER_SITE_OTHER): Payer: BLUE CROSS/BLUE SHIELD | Admitting: Gastroenterology

## 2017-08-16 ENCOUNTER — Other Ambulatory Visit: Payer: Self-pay | Admitting: *Deleted

## 2017-08-16 ENCOUNTER — Encounter: Payer: Self-pay | Admitting: *Deleted

## 2017-08-16 ENCOUNTER — Telehealth: Payer: Self-pay | Admitting: *Deleted

## 2017-08-16 ENCOUNTER — Encounter: Payer: Self-pay | Admitting: Gastroenterology

## 2017-08-16 ENCOUNTER — Telehealth: Payer: Self-pay

## 2017-08-16 DIAGNOSIS — R1310 Dysphagia, unspecified: Secondary | ICD-10-CM

## 2017-08-16 DIAGNOSIS — K219 Gastro-esophageal reflux disease without esophagitis: Secondary | ICD-10-CM

## 2017-08-16 DIAGNOSIS — K5901 Slow transit constipation: Secondary | ICD-10-CM

## 2017-08-16 DIAGNOSIS — R634 Abnormal weight loss: Secondary | ICD-10-CM

## 2017-08-16 DIAGNOSIS — K311 Adult hypertrophic pyloric stenosis: Secondary | ICD-10-CM

## 2017-08-16 NOTE — Telephone Encounter (Signed)
[place], spoke with Evette Cristal. No PA is required for EGD/DIL. Ref # [id number]

## 2017-08-16 NOTE — Telephone Encounter (Signed)
Pt called office and LMOVM, called her back. Advised of new time for procedure and NPO after 10:45am morning of procedure.

## 2017-08-16 NOTE — Telephone Encounter (Signed)
EGD/DIL for 08/22/17 had to be moved to 2:45pm, arrive at 1:45pm. Tried to call pt, no answer, LMOVM for return call.

## 2017-08-16 NOTE — Assessment & Plan Note (Signed)
WEIGHT DOWN 7 LBS SINCE NOV 2018.  CONTINUE TO MONITOR SYMPTOMS.

## 2017-08-16 NOTE — H&P (View-Only) (Signed)
Subjective:    Patient ID: Brianna [name], female    DOB: [birth year], 57 y.o.   MRN: [id number]  Patient, No Pcp Per   HPI EXPERIENCED RELIEVE OF DYSPHAGIA AFTER DEC 2018: EGD/DIL AND WITHIN THE LAST MO THEY CAME BACK. ALWAYS STARTS WITH FEELING OF SOLID FOOD STUCK IN ESOPHAGUS. SYMPTOMS FOR ONE MO. STILL HAVING TROUBLE WITH FLARES: ONCE A WEEK. DRINKS SODAS AND GETS WORSE. NO COFFEE IN SUMMER. EATING TOMATOES: ONCE A WEEK. EATING SPAGHETTI EVER NOW AND THEN. BURN SIN HER THROAT AND THEN SOUR BURP AND MAY THROWS UP ONCE A WEEK AND TRIES TO THROW IT UP TO RELIEVE THE SENSATION.  OUTSIDE OF EPISODES NO NAUSEA OR VOMITING. WEIGHT LOSS: USUALLY WEIGHS 162-153 LBS. NO ETOH.  TAKES GOODY POWDERS/IBUPROFEN  PRN. NO ASPIRIN, OR NAPROXEN/ALEVE. BMs: CONSTIPATION SOMETIMES: Q1-3 DAYS. NORMALLY RELIEVES ITSELF.   PT DENIES FEVER, CHILLS, HEMATOCHEZIA, HEMATEMESIS, , melena, diarrhea, CHEST PAIN, SHORTNESS OF BREATH,  CHANGE IN BOWEL IN HABITS, abdominal pain, problems with sedation.  Past Medical History:  Diagnosis Date  . Diabetes mellitus ([place])   . Gastritis   . GERD without esophagitis     Past Surgical History:  Procedure Laterality Date  . CARPAL TUNNEL RELEASE     left  . COLONOSCOPY WITH ESOPHAGOGASTRODUODENOSCOPY (EGD) N/A 04/02/2012   SLF: SMALL RECTAL polyp/Small internal hemorrhoids. Polyp hyperplastic. On EGD she had mild gastritis. Biopsy showed chronic and active gastritis and no H. pylori. Next colonoscopy in 10 years with overtube/? Propofol  . ESOPHAGEAL DILATION  12/09/2016   Procedure: ESOPHAGEAL DILATION;  Surgeon: Danie Binder, MD;  [facility]: [facility];  Service: Endoscopy;;  . ESOPHAGOGASTRODUODENOSCOPY  03/2012   SLF: gastritis  . ESOPHAGOGASTRODUODENOSCOPY N/A 12/09/2016   Procedure: ESOPHAGOGASTRODUODENOSCOPY (EGD);  Surgeon: Danie Binder, MD;  Location: [facility];  Service: Endoscopy;  Laterality: N/A;  11:30am  . PARTIAL HYSTERECTOMY    . TRIGGER  FINGER RELEASE Left    Allergies  Allergen Reactions  . Other Hives and Shortness Of Breath    seafood  . Iodine Hives    shellfish  . Neosporin [Neomycin-Bacitracin Zn-Polymyx] Rash    Current Outpatient Medications  Medication Sig    . acetaminophen (TYLENOL) 500 MG tablet Take 500 mg by mouth every 6 (six) hours as needed for moderate pain.    Marland Kitchen amitriptyline (ELAVIL) 50 MG tablet Take 50 mg by mouth at bedtime.    . Canagliflozin-Metformin HCl (INVOKAMET) 50-1000 MG TABS Take 1 tablet by mouth every evening.    . Cholecalciferol 50000 units capsule Take 50,000 Units by mouth every 7 (seven) days. Thursday    .      . ibuprofen (ADVIL,MOTRIN) 800 MG tablet Take 800 mg by mouth every 8 (eight) hours as needed for mild pain.    . Insulin Aspart (NOVOLOG [place]) Inject 6-12 Units into the skin 2 (two) times daily. 6 units in am, 10-12 units at bedtime    . ONE TOUCH ULTRA TEST test strip     . pantoprazole (PROTONIX) 40 MG tablet Take 1 tablet (40 mg total) by mouth daily before breakfast.    . polyethylene glycol (MIRALAX / GLYCOLAX) packet Take 17 g by mouth daily as needed for mild constipation.     .      .      .       Social History   Socioeconomic History  . Marital status: Married    Spouse name: Not on file  .  Number of children: 2  . Years of education: Not on file  . Highest education level: Not on file  Occupational History  . 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 Financial resource strain: Not on file  . Food insecurity:    Worry: Not on file    Inability: Not on file  . Transportation needs:    Medical: Not on file    Non-medical: Not on file  Tobacco Use  . Smoking status: Never Smoker  . Smokeless tobacco: Never Used  Substance and Sexual Activity  . Alcohol use: No    Alcohol/week: 0.0 standard drinks  . Drug use: No  . Sexual activity: Not on file  Lifestyle  . Physical activity:    Days per week: Not on file     Minutes per session: Not on file  . Stress: Not on file  Relationships  . Social connections:    Talks on phone: Not on file    Gets together: Not on file    Attends religious service: Not on file    Active member of club or organization: Not on file    Attends meetings of clubs or organizations: Not on file    Relationship status: Not on file  Other Topics Concern  . Not on file  Social History Narrative   Lives w/ husband 2 kids-17/20 healthy. STILL WORKING AT THE [employer].    Review of Systems PER HPI OTHERWISE ALL SYSTEMS ARE NEGATIVE.    Objective:   Physical Exam  Constitutional: She is oriented to person, place, and time. She appears well-developed and well-nourished. No distress.  HENT:  Head: Normocephalic and atraumatic.  Mouth/Throat: Oropharynx is clear and moist. No oropharyngeal exudate.  Eyes: Pupils are equal, round, and reactive to light. No scleral icterus.  Neck: Normal range of motion. Neck supple.  Cardiovascular: Normal rate, regular rhythm and normal heart sounds.  Pulmonary/Chest: Effort normal and breath sounds normal. No respiratory distress.  Abdominal: Soft. Bowel sounds are normal. She exhibits no distension. There is no tenderness.  Musculoskeletal: She exhibits no edema.  Lymphadenopathy:    She has no cervical adenopathy.  Neurological: She is alert and oriented to person, place, and time.  NO FOCAL DEFICITS  Psychiatric: She has a normal mood and affect.  Vitals reviewed.     Assessment & Plan:

## 2017-08-16 NOTE — Assessment & Plan Note (Signed)
SYMPTOMS NOT IDEALLY CONTROLLED.  DRINK WATER TO KEEP YOUR URINE LIGHT YELLOW. AVOID REFLUX TRIGGERS.  HANDOUT GIVEN. FOLLOW A SOFT MECHANICAL DIET.  MEATS SHOULD BE GROUND ONLY. DO NOT EAT CHUNKS OF ANYTHING.  HANDOUT GIVEN.. CONTINUE PROTONIX. TAKE 30 MINUTES PRIOR TO BREAKFAST. COMPLETE UPPER ENDOSCOPY TO STRETCH YOUR ESOPHAGUS. DISCUSSED PROCEDURE, BENEFITS, & RISKS: < 1% chance of medication reaction, bleeding, OR perforation.  FOLLOW UP IN 4 MOS.

## 2017-08-16 NOTE — Patient Instructions (Addendum)
DRINK WATER TO KEEP YOUR URINE LIGHT YELLOW.  AVOID REFLUX TRIGGERS. SEE INFO BELOW.  FOLLOW A SOFT MECHANICAL DIET.  MEATS SHOULD BE GROUND ONLY. DO NOT EAT CHUNKS OF ANYTHING. SEE INFO BELOW.  CONTINUE PROTONIX. TAKE 30 MINUTES PRIOR TO BREAKFAST.   COMPLETE UPPER ENDOSCOPY TO STRETCH YOUR ESOPHAGUS.  FOLLOW UP IN 4 MOS.   Lifestyle and home remedies TO CONTROL HEARTBURN You may eliminate or reduce the frequency of heartburn by making the following lifestyle changes:  . Control your weight. Being overweight is a major risk factor for heartburn and GERD. Excess pounds put pressure on your abdomen, pushing up your stomach and causing acid to back up into your esophagus.   . Eat smaller meals. 4 TO 6 MEALS A DAY. This reduces pressure on the lower esophageal sphincter, helping to prevent the valve from opening and acid from washing back into your esophagus.   Dolphus Jenny your belt. Clothes that fit tightly around your waist put pressure on your abdomen and the lower esophageal sphincter.   . Eliminate heartburn triggers. Everyone has specific triggers. Common triggers such as fatty or fried foods, spicy food, tomato sauce, carbonated beverages, alcohol, chocolate, mint, garlic, onion, caffeine and nicotine may make heartburn worse.   Marland Kitchen Avoid stooping or bending. Tying your shoes is OK. Bending over for longer periods to weed your garden isn't, especially soon after eating.   . Don't lie down after a meal. Wait at least three to four hours after eating before going to bed, and don't lie down right after eating.   Alternative medicine . Several home remedies exist for treating GERD, but they provide only temporary relief. They include drinking baking soda (sodium bicarbonate) added to water or drinking other fluids such as baking soda mixed with cream of tartar and water. . Although these liquids create temporary relief by neutralizing, washing away or buffering acids, eventually they  aggravate the situation by adding gas and fluid to your stomach, increasing pressure and causing more acid reflux. Further, adding more sodium to your diet may increase your blood pressure and add stress to your heart, and excessive bicarbonate ingestion can alter the acid-base balance in your body.     SOFT MECHANICAL DIET This SOFT MECHANICAL DIET is restricted to:  Foods that are moist, soft-textured, and easy to chew and swallow.   Meats that are ground or are minced no larger than one-quarter inch pieces. Meats are moist with gravy or sauce added.   Foods that do not include bread or bread-like textures except soft pancakes, well-moistened with syrup or sauce.   Textures with some chewing ability required.   Casseroles without rice.   Cooked vegetables that are less than half an inch in size and easily mashed with a fork. No cooked corn, peas, broccoli, cauliflower, cabbage, Brussels sprouts, asparagus, or other fibrous, non-tender or rubbery cooked vegetables.   Canned fruit except for pineapple. Fruit must be cut into pieces no larger than half an inch in size.   Foods that do not include nuts, seeds, coconut, or sticky textures.   FOOD TEXTURES FOR DYSPHAGIA DIET LEVEL 2 -SOFT MECHANICAL DIET (includes all foods on Dysphagia Diet Level 1 - Pureed, in addition to the foods listed below)  FOOD GROUP: Breads. RECOMMENDED: Soft pancakes, well-moistened with syrup or sauce.  AVOID: All others.  FOOD GROUP: Cereals.  RECOMMENDED: Cooked cereals with little texture, including oatmeal. Unprocessed wheat bran stirred into cereals for bulk. Note: If thin liquids are  restricted, it is important that all of the liquid is absorbed into the cereal.  AVOID: All dry cereals and any cooked cereals that may contain flax seeds or other seeds or nuts. Whole-grain, dry, or coarse cereals. Cereals with nuts, seeds, dried fruit, and/or coconut.  FOOD GROUP: Desserts. RECOMMENDED: Pudding,  custard. Soft fruit pies with bottom crust only. Canned fruit (excluding pineapple). Soft, moist cakes with icing.Frozen malts, milk shakes, frozen yogurt, eggnog, nutritional supplements, ice cream, sherbet, regular or sugar-free gelatin, or any foods that become thin liquid at either room (70 F) or body temperature (98 F).  AVOID: Dry, coarse cakes and cookies. Anything with nuts, seeds, coconut, pineapple, or dried fruit. Breakfast yogurt with nuts. Rice or bread pudding.  FOOD GROUP: Fats. RECOMMENDED: Butter, margarine, cream for cereal (depending on liquid consistency recommendations), gravy, cream sauces, sour cream, sour cream dips with soft additives, mayonnaise, salad dressings, cream cheese, cream cheese spreads with soft additives, whipped toppings.  AVOID: All fats with coarse or chunky additives.  FOOD GROUP: Fruits. RECOMMENDED: Soft drained, canned, or cooked fruits without seeds or skin. Fresh soft and ripe banana. Fruit juices with a small amount of pulp. If thin liquids are restricted, fruit juices should be thickened to appropriate consistency.  AVOID: Fresh or frozen fruits. Cooked fruit with skin or seeds. Dried fruits. Fresh, canned, or cooked pineapple.  FOOD GROUP: Meats and Meat Substitutes. (Meat pieces should not exceed 1/4 of an inch cube and should be tender.) RECOMMENDED: Moistened ground or cooked meat, poultry, or fish. Moist ground or tender meat may be served with gravy or sauce. Casseroles without rice. Moist macaroni and cheese, well-cooked pasta with meat sauce, tuna noodle casserole, soft, moist lasagna. Moist meatballs, meatloaf, or fish loaf. Protein salads, such as tuna or egg without large chunks, celery, or onion. Cottage cheese, smooth quiche without large chunks. Poached, scrambled, or soft-cooked eggs (egg yolks should not be "runny" but should be moist and able to be mashed with butter, margarine, or other moisture added to them). (Cook eggs to 160  F or use pasteurized eggs for safety.) Souffls may have small, soft chunks. Tofu. Well-cooked, slightly mashed, moist legumes, such as baked beans. All meats or protein substitutes should be served with sauces or moistened to help maintain cohesiveness in the oral cavity.  AVOID: Dry meats, tough meats (such as bacon, sausage, hot dogs, bratwurst). Dry casseroles or casseroles with rice or large chunks. Peanut butter. Cheese slices and cubes. Hard-cooked or crisp fried eggs. Sandwiches.Pizza.  FOOD GROUP: Potatoes and Starches. RECOMMENDED: Well-cooked, moistened, boiled, baked, or mashed potatoes. Well-cooked shredded hash brown potatoes that are not crisp. (All potatoes need to be moist and in sauces.)Well-cooked noodles in sauce. Spaetzel or soft dumplings that have been moistened with butter or gravy.  AVOID: Potato skins and chips. Fried or French-fried potatoes. Rice.  FOOD GROUP: Soups. RECOMMENDED: Soups with easy-to-chew or easy-to-swallow meats or vegetables: Particle sizes in soups should be less than 1/2 inch. Soups will need to be thickened to appropriate consistency if soup is thinner than prescribed liquid consistency.  AVOID: Soups with large chunks of meat and vegetables. Soups with rice, corn, peas.  FOOD GROUP: Vegetables. RECOMMENDED: All soft, well-cooked vegetables. Vegetables should be less than a half inch. Should be easily mashed with a fork.  AVOID: Cooked corn and peas. Broccoli, cabbage, Brussels sprouts, asparagus, or other fibrous, non-tender or rubbery cooked vegetables.  FOOD GROUP: Miscellaneous. RECOMMENDED: Jams and preserves without seeds, jelly. Sauces, salsas,  etc., that may have small tender chunks less than 1/2 inch. Soft, smooth chocolate bars that are easily chewed.  AVOID: Seeds, nuts, coconut, or sticky foods. Chewy candies such as caramels or licorice.

## 2017-08-16 NOTE — Progress Notes (Addendum)
Subjective:    Patient ID: Brianna [name], female    DOB: 07/19/1960, 57 y.o.   MRN: [id number]  Patient, No Pcp Per   HPI EXPERIENCED RELIEVE OF DYSPHAGIA AFTER DEC 2018: EGD/DIL AND WITHIN THE LAST MO THEY CAME BACK. ALWAYS STARTS WITH FEELING OF SOLID FOOD STUCK IN ESOPHAGUS. SYMPTOMS FOR ONE MO. STILL HAVING TROUBLE WITH FLARES: ONCE A WEEK. DRINKS SODAS AND GETS WORSE. NO COFFEE IN SUMMER. EATING TOMATOES: ONCE A WEEK. EATING SPAGHETTI EVER NOW AND THEN. BURN SIN HER THROAT AND THEN SOUR BURP AND MAY THROWS UP ONCE A WEEK AND TRIES TO THROW IT UP TO RELIEVE THE SENSATION.  OUTSIDE OF EPISODES NO NAUSEA OR VOMITING. WEIGHT LOSS: USUALLY WEIGHS 162-153 LBS. NO ETOH.  TAKES GOODY POWDERS/IBUPROFEN  PRN. NO ASPIRIN, OR NAPROXEN/ALEVE. BMs: CONSTIPATION SOMETIMES: Q1-3 DAYS. NORMALLY RELIEVES ITSELF.   PT DENIES FEVER, CHILLS, HEMATOCHEZIA, HEMATEMESIS, , melena, diarrhea, CHEST PAIN, SHORTNESS OF BREATH,  CHANGE IN BOWEL IN HABITS, abdominal pain, problems with sedation.  Past Medical History:  Diagnosis Date  . Diabetes mellitus ([place])   . Gastritis   . GERD without esophagitis     Past Surgical History:  Procedure Laterality Date  . CARPAL TUNNEL RELEASE     left  . COLONOSCOPY WITH ESOPHAGOGASTRODUODENOSCOPY (EGD) N/A 04/02/2012   SLF: SMALL RECTAL polyp/Small internal hemorrhoids. Polyp hyperplastic. On EGD she had mild gastritis. Biopsy showed chronic and active gastritis and no H. pylori. Next colonoscopy in 10 years with overtube/? Propofol  . ESOPHAGEAL DILATION  12/09/2016   Procedure: ESOPHAGEAL DILATION;  Surgeon: Danie Binder, MD;  [facility];  Service: Endoscopy;;  . ESOPHAGOGASTRODUODENOSCOPY  03/2012   SLF: gastritis  . ESOPHAGOGASTRODUODENOSCOPY N/A 12/09/2016   Procedure: ESOPHAGOGASTRODUODENOSCOPY (EGD);  Surgeon: Danie Binder, MD;  [facility];  Service: Endoscopy;  Laterality: N/A;  11:30am  . PARTIAL HYSTERECTOMY    . TRIGGER  FINGER RELEASE Left    Allergies  Allergen Reactions  . Other Hives and Shortness Of Breath    seafood  . Iodine Hives    shellfish  . Neosporin [Neomycin-Bacitracin Zn-Polymyx] Rash    Current Outpatient Medications  Medication Sig    . acetaminophen (TYLENOL) 500 MG tablet Take 500 mg by mouth every 6 (six) hours as needed for moderate pain.    Marland Kitchen amitriptyline (ELAVIL) 50 MG tablet Take 50 mg by mouth at bedtime.    . Canagliflozin-Metformin HCl (INVOKAMET) 50-1000 MG TABS Take 1 tablet by mouth every evening.    . Cholecalciferol 50000 units capsule Take 50,000 Units by mouth every 7 (seven) days. Thursday    .      . ibuprofen (ADVIL,MOTRIN) 800 MG tablet Take 800 mg by mouth every 8 (eight) hours as needed for mild pain.    . Insulin Aspart (NOVOLOG [place]) Inject 6-12 Units into the skin 2 (two) times daily. 6 units in am, 10-12 units at bedtime    . ONE TOUCH ULTRA TEST test strip     . pantoprazole (PROTONIX) 40 MG tablet Take 1 tablet (40 mg total) by mouth daily before breakfast.    . polyethylene glycol (MIRALAX / GLYCOLAX) packet Take 17 g by mouth daily as needed for mild constipation.     .      .      .       Social History   Socioeconomic History  . Marital status: Married    Spouse name: Not on file  .  Number of children: 2  . Years of education: Not on file  . Highest education level: Not on file  Occupational History  . 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 Financial resource strain: Not on file  . Food insecurity:    Worry: Not on file    Inability: Not on file  . Transportation needs:    Medical: Not on file    Non-medical: Not on file  Tobacco Use  . Smoking status: Never Smoker  . Smokeless tobacco: Never Used  Substance and Sexual Activity  . Alcohol use: No    Alcohol/week: 0.0 standard drinks  . Drug use: No  . Sexual activity: Not on file  Lifestyle  . Physical activity:    Days per week: Not on file     Minutes per session: Not on file  . Stress: Not on file  Relationships  . Social connections:    Talks on phone: Not on file    Gets together: Not on file    Attends religious service: Not on file    Active member of club or organization: Not on file    Attends meetings of clubs or organizations: Not on file    Relationship status: Not on file  Other Topics Concern  . Not on file  Social History Narrative   Lives w/ husband 2 kids-17/20 healthy. STILL WORKING AT THE [employer].    Review of Systems PER HPI OTHERWISE ALL SYSTEMS ARE NEGATIVE.    Objective:   Physical Exam  Constitutional: She is oriented to person, place, and time. She appears well-developed and well-nourished. No distress.  HENT:  Head: Normocephalic and atraumatic.  Mouth/Throat: Oropharynx is clear and moist. No oropharyngeal exudate.  Eyes: Pupils are equal, round, and reactive to light. No scleral icterus.  Neck: Normal range of motion. Neck supple.  Cardiovascular: Normal rate, regular rhythm and normal heart sounds.  Pulmonary/Chest: Effort normal and breath sounds normal. No respiratory distress.  Abdominal: Soft. Bowel sounds are normal. She exhibits no distension. There is no tenderness.  Musculoskeletal: She exhibits no edema.  Lymphadenopathy:    She has no cervical adenopathy.  Neurological: She is alert and oriented to person, place, and time.  NO FOCAL DEFICITS  Psychiatric: She has a normal mood and affect.  Vitals reviewed.     Assessment & Plan:

## 2017-08-16 NOTE — Assessment & Plan Note (Signed)
SYMPTOMS CONTROLLED/RESOLVED.  DRINK WATER EAT FIBER FOLLOW UP IN 4 MOS.

## 2017-08-16 NOTE — Assessment & Plan Note (Signed)
SYMPTOMS CONTROLLED/RESOLVED.  CONTINUE TO MONITOR SYMPTOMS. 

## 2017-08-16 NOTE — Assessment & Plan Note (Signed)
SYMPTOMS NOT CONTROLLED and likely due to uncontrolled GERD OR PROXIMAL ESOPHAGEAL WEB.  DRINK WATER TO KEEP YOUR URINE LIGHT YELLOW. AVOID REFLUX TRIGGERS.  HANDOUT GIVEN. FOLLOW A SOFT MECHANICAL DIET.  MEATS SHOULD BE GROUND ONLY. DO NOT EAT CHUNKS OF ANYTHING.  HANDOUT GIVEN. CONTINUE PROTONIX. TAKE 30 MINUTES PRIOR TO BREAKFAST. COMPLETE UPPER ENDOSCOPY TO STRETCH YOUR ESOPHAGUS. DISCUSSED PROCEDURE, BENEFITS, & RISKS: < 1% chance of medication reaction, bleeding, OR perforation.  FOLLOW UP IN 4 MOS.

## 2017-08-22 ENCOUNTER — Encounter (HOSPITAL_COMMUNITY)
Admission: RE | Disposition: A | Discharge status: Home / Self Care | Payer: Self-pay | Source: Ambulatory Visit | Attending: Gastroenterology

## 2017-08-22 ENCOUNTER — Other Ambulatory Visit: Payer: Self-pay

## 2017-08-22 ENCOUNTER — Encounter (HOSPITAL_COMMUNITY): Payer: Self-pay | Admitting: *Deleted

## 2017-08-22 ENCOUNTER — Ambulatory Visit (HOSPITAL_COMMUNITY)
Admission: RE | Admit: 2017-08-22 | Discharge: 2017-08-22 | Disposition: A | Discharge status: Home / Self Care | Payer: BLUE CROSS/BLUE SHIELD | Source: Ambulatory Visit | Attending: Gastroenterology | Admitting: Gastroenterology

## 2017-08-22 DIAGNOSIS — R1013 Epigastric pain: Secondary | ICD-10-CM | POA: Diagnosis not present

## 2017-08-22 DIAGNOSIS — K219 Gastro-esophageal reflux disease without esophagitis: Secondary | ICD-10-CM | POA: Diagnosis not present

## 2017-08-22 DIAGNOSIS — R634 Abnormal weight loss: Secondary | ICD-10-CM

## 2017-08-22 DIAGNOSIS — Z79899 Other long term (current) drug therapy: Secondary | ICD-10-CM | POA: Diagnosis not present

## 2017-08-22 DIAGNOSIS — Z794 Long term (current) use of insulin: Secondary | ICD-10-CM | POA: Insufficient documentation

## 2017-08-22 DIAGNOSIS — E119 Type 2 diabetes mellitus without complications: Secondary | ICD-10-CM | POA: Diagnosis not present

## 2017-08-22 DIAGNOSIS — K311 Adult hypertrophic pyloric stenosis: Secondary | ICD-10-CM | POA: Diagnosis not present

## 2017-08-22 DIAGNOSIS — R1310 Dysphagia, unspecified: Secondary | ICD-10-CM | POA: Diagnosis not present

## 2017-08-22 HISTORY — PX: ESOPHAGOGASTRODUODENOSCOPY: SHX5428

## 2017-08-22 HISTORY — PX: SAVORY DILATION: SHX5439

## 2017-08-22 LAB — GLUCOSE, CAPILLARY
Glucose-Capillary: 203 mg/dL — ABNORMAL HIGH (ref 70–99)
Glucose-Capillary: 51 mg/dL — ABNORMAL LOW (ref 70–99)
Glucose-Capillary: 128 mg/dL — ABNORMAL HIGH (ref 70–99)

## 2017-08-22 SURGERY — EGD (ESOPHAGOGASTRODUODENOSCOPY)
Anesthesia: Moderate Sedation

## 2017-08-22 MED ORDER — DEXTROSE 5 % AND 0.9 % NACL IV BOLUS
INTRAVENOUS | Status: AC | PRN
  Administered 2017-08-22: 1000 mL via INTRAVENOUS

## 2017-08-22 MED ORDER — MIDAZOLAM HCL 5 MG/5ML IJ SOLN
INTRAMUSCULAR | Status: DC | PRN
  Administered 2017-08-22: 2 mg via INTRAVENOUS
  Administered 2017-08-22 (×2): 1 mg via INTRAVENOUS

## 2017-08-22 MED ORDER — MEPERIDINE HCL 100 MG/ML IJ SOLN
INTRAMUSCULAR | Status: AC
  Filled 2017-08-22: qty 2

## 2017-08-22 MED ORDER — MEPERIDINE HCL 100 MG/ML IJ SOLN
INTRAMUSCULAR | Status: DC | PRN
  Administered 2017-08-22: 25 mg via INTRAVENOUS

## 2017-08-22 MED ORDER — LIDOCAINE VISCOUS HCL 2 % MT SOLN
OROMUCOSAL | Status: DC | PRN
  Administered 2017-08-22: 4 mL via OROMUCOSAL

## 2017-08-22 MED ORDER — SODIUM CHLORIDE 0.9 % IV SOLN
INTRAVENOUS | Status: DC
  Administered 2017-08-22: 14:00:00 via INTRAVENOUS

## 2017-08-22 MED ORDER — STERILE WATER FOR IRRIGATION IR SOLN
Status: DC | PRN
  Administered 2017-08-22: 100 mL

## 2017-08-22 MED ORDER — LIDOCAINE VISCOUS HCL 2 % MT SOLN
OROMUCOSAL | Status: AC
  Filled 2017-08-22: qty 15

## 2017-08-22 MED ORDER — MIDAZOLAM HCL 5 MG/5ML IJ SOLN
INTRAMUSCULAR | Status: AC
  Filled 2017-08-22: qty 10

## 2017-08-22 MED ORDER — MINERAL OIL PO OIL
TOPICAL_OIL | ORAL | Status: AC
  Filled 2017-08-22: qty 30

## 2017-08-22 NOTE — Discharge Instructions (Signed)
I dilated your esophagus. I DID NOT SEE A DEFINITE NARROWING IN YOUR esophagus. You have a MILD gastritis. IT LOOKED LIKE YOUR PYLORUS WAS NARROW. THIS CAN CAUSE WEIGHT LOSS. I STRETCHED YOUR PYLORUS AGAIN.     EAT 4 TO 6 MEALS A DAY.  FOLLOW A LOW FAT DIET. SEE INFO BELOW. MEATS SHOULD BE BAKED, BROILED, OR BOILED. Avoid fried foods. See info below.  CONTINUE PROTONIX. TAKE 30 MINUTES PRIOR TO BREAKFAST.  FOLLOW UP IN 4 MOS. UPPER ENDOSCOPY AFTER CARE Read the instructions outlined below and refer to this sheet in the next week. These discharge instructions provide you with general information on caring for yourself after you leave the hospital. While your treatment has been planned according to the most current medical practices available, unavoidable complications occasionally occur. If you have any problems or questions after discharge, call DR. Christne Platts, [phone].  ACTIVITY  You may resume your regular activity, but move at a slower pace for the next 24 hours.   Take frequent rest periods for the next 24 hours.   Walking will help get rid of the air and reduce the bloated feeling in your belly (abdomen).   No driving for 24 hours (because of the medicine (anesthesia) used during the test).   You may shower.   Do not sign any important legal documents or operate any machinery for 24 hours (because of the anesthesia used during the test).    NUTRITION  Drink plenty of fluids.   You may resume your normal diet as instructed by your doctor.   Begin with a light meal and progress to your normal diet. Heavy or fried foods are harder to digest and may make you feel sick to your stomach (nauseated).   Avoid alcoholic beverages for 24 hours or as instructed.    MEDICATIONS  You may resume your normal medications.   WHAT YOU CAN EXPECT TODAY  Some feelings of bloating in the abdomen.   Passage of more gas than usual.    IF YOU HAD A BIOPSY TAKEN DURING THE UPPER  ENDOSCOPY:  Eat a soft diet IF YOU HAVE NAUSEA, BLOATING, ABDOMINAL PAIN, OR VOMITING.    FINDING OUT THE RESULTS OF YOUR TEST Not all test results are available during your visit. DR. Oneida Alar WILL CALL YOU WITHIN 7 DAYS OF YOUR PROCEDUE WITH YOUR RESULTS. Do not assume everything is normal if you have not heard from DR. Abena Erdman IN ONE WEEK, CALL HER OFFICE AT [phone].  SEEK IMMEDIATE MEDICAL ATTENTION AND CALL THE OFFICE: [phone] IF:  You have more than a spotting of blood in your stool.   Your belly is swollen (abdominal distention).   You are nauseated or vomiting.   You have a temperature over 101F.   You have abdominal pain or discomfort that is severe or gets worse throughout the day.  Gastritis  Gastritis is an inflammation (the body's way of reacting to injury and/or infection) of the stomach. It is often caused by viral or bacterial (germ) infections. It can also be caused BY ASPIRIN, BC/GOODY POWDER'S, (IBUPROFEN) MOTRIN, OR ALEVE (NAPROXEN), chemicals (including alcohol), SPICY FOODS, and medications. This illness may be associated with generalized malaise (feeling tired, not well), UPPER ABDOMINAL STOMACH cramps, and fever. One common bacterial cause of gastritis is an organism known as H. Pylori. This can be treated with antibiotics.   DYSPHAGIA DYSPHAGIA can be caused by stomach acid backing up into the tube that carries food from the mouth down to the  stomach (lower esophagus).  TREATMENT There are a number of non-prescription medicines used to treat DYSPHAGIA including: Antacids.  Proton-pump inhibitors: PROTONIX ZANTAC OR PEPCID  HOME CARE INSTRUCTIONS Eat 2-3 hours before going to bed.  Try to reach and maintain a healthy weight.  Do not eat just a few very large meals. Instead, eat 4 TO 6 smaller meals throughout the day.  Try to identify foods and beverages that make your symptoms worse, and avoid these.  Avoid tight clothing.  Do not exercise right  after eating.   Low-Fat Diet BREADS, CEREALS, PASTA, RICE, DRIED PEAS, AND BEANS These products are high in carbohydrates and most are low in fat. Therefore, they can be increased in the diet as substitutes for fatty foods. They too, however, contain calories and should not be eaten in excess. Cereals can be eaten for snacks as well as for breakfast.   FRUITS AND VEGETABLES It is good to eat fruits and vegetables. Besides being sources of fiber, both are rich in vitamins and some minerals. They help you get the daily allowances of these nutrients. Fruits and vegetables can be used for snacks and desserts.  MEATS Limit lean meat, chicken, [place], and fish to no more than 6 ounces per day. Beef, Pork, and Lamb Use lean cuts of beef, pork, and lamb. Lean cuts include:  Extra-lean ground beef.  Arm roast.  Sirloin tip.  Center-cut ham.  Round steak.  Loin chops.  Rump roast.  Tenderloin.  Trim all fat off the outside of meats before cooking. It is not necessary to severely decrease the intake of red meat, but lean choices should be made. Lean meat is rich in protein and contains a highly absorbable form of iron. Premenopausal women, in particular, should avoid reducing lean red meat because this could increase the risk for low red blood cells (iron-deficiency anemia).  Chicken and [place] These are good sources of protein. The fat of poultry can be reduced by removing the skin and underlying fat layers before cooking. Chicken and [place] can be substituted for lean red meat in the diet. Poultry should not be fried or covered with high-fat sauces. Fish and Shellfish Fish is a good source of protein. Shellfish contain cholesterol, but they usually are low in saturated fatty acids. The preparation of fish is important. Like chicken and [place], they should not be fried or covered with high-fat sauces. EGGS Egg whites contain no fat or cholesterol. They can be eaten often. Try 1 to 2 egg whites  instead of whole eggs in recipes or use egg substitutes that do not contain yolk. MILK AND DAIRY PRODUCTS Use skim or 1% milk instead of 2% or whole milk. Decrease whole milk, natural, and processed cheeses. Use nonfat or low-fat (2%) cottage cheese or low-fat cheeses made from vegetable oils. Choose nonfat or low-fat (1 to 2%) yogurt. Experiment with evaporated skim milk in recipes that call for heavy cream. Substitute low-fat yogurt or low-fat cottage cheese for sour cream in dips and salad dressings. Have at least 2 servings of low-fat dairy products, such as 2 glasses of skim (or 1%) milk each day to help get your daily calcium intake. FATS AND OILS Reduce the total intake of fats, especially saturated fat. Butterfat, lard, and beef fats are high in saturated fat and cholesterol. These should be avoided as much as possible. Vegetable fats do not contain cholesterol, but certain vegetable fats, such as coconut oil, palm oil, and palm kernel oil are very high  in saturated fats. These should be limited. These fats are often used in bakery goods, processed foods, popcorn, oils, and nondairy creamers. Vegetable shortenings and some peanut butters contain hydrogenated oils, which are also saturated fats. Read the labels on these foods and check for saturated vegetable oils. Unsaturated vegetable oils and fats do not raise blood cholesterol. However, they should be limited because they are fats and are high in calories. Total fat should still be limited to 30% of your daily caloric intake. Desirable liquid vegetable oils are corn oil, cottonseed oil, olive oil, canola oil, safflower oil, soybean oil, and sunflower oil. Peanut oil is not as good, but small amounts are acceptable. Buy a heart-healthy tub margarine that has no partially hydrogenated oils in the ingredients. Mayonnaise and salad dressings often are made from unsaturated fats, but they should also be limited because of their high calorie and fat  content. Seeds, nuts, peanut butter, olives, and avocados are high in fat, but the fat is mainly the unsaturated type. These foods should be limited mainly to avoid excess calories and fat. OTHER EATING TIPS Snacks  Most sweets should be limited as snacks. They tend to be rich in calories and fats, and their caloric content outweighs their nutritional value. Some good choices in snacks are graham crackers, melba toast, soda crackers, bagels (no egg), English muffins, fruits, and vegetables. These snacks are preferable to snack crackers, [place] fries, TORTILLA CHIPS, and POTATO chips. Popcorn should be air-popped or cooked in small amounts of liquid vegetable oil. Desserts Eat fruit, low-fat yogurt, and fruit ices instead of pastries, cake, and cookies. Sherbet, angel food cake, gelatin dessert, frozen low-fat yogurt, or other frozen products that do not contain saturated fat (pure fruit juice bars, frozen ice pops) are also acceptable.  COOKING METHODS Choose those methods that use little or no fat. They include: Poaching.  Braising.  Steaming.  Grilling.  Baking.  Stir-frying.  Broiling.  Microwaving.  Foods can be cooked in a nonstick pan without added fat, or use a nonfat cooking spray in regular cookware. Limit fried foods and avoid frying in saturated fat. Add moisture to lean meats by using water, broth, cooking wines, and other nonfat or low-fat sauces along with the cooking methods mentioned above. Soups and stews should be chilled after cooking. The fat that forms on top after a few hours in the refrigerator should be skimmed off. When preparing meals, avoid using excess salt. Salt can contribute to raising blood pressure in some people.  EATING AWAY FROM HOME Order entres, potatoes, and vegetables without sauces or butter. When meat exceeds the size of a deck of cards (3 to 4 ounces), the rest can be taken home for another meal. Choose vegetable or fruit salads and ask for  low-calorie salad dressings to be served on the side. Use dressings sparingly. Limit high-fat toppings, such as bacon, crumbled eggs, cheese, sunflower seeds, and olives. Ask for heart-healthy tub margarine instead of butter.

## 2017-08-22 NOTE — Interval H&P Note (Signed)
History and Physical Interval Note:  08/22/2017 3:24 PM  Brianna [name]  has presented today for surgery, with the diagnosis of dysphagia  The various methods of treatment have been discussed with the patient and family. After consideration of risks, benefits and other options for treatment, the patient has consented to  Procedure(s) with comments: ESOPHAGOGASTRODUODENOSCOPY (EGD) (N/A) - 11:30am SAVORY DILATION (N/A) as a surgical intervention .  The patient's history has been reviewed, patient examined, no change in status, stable for surgery.  I have reviewed the patient's chart and labs.  Questions were answered to the patient's satisfaction.     [employer]

## 2017-08-22 NOTE — Op Note (Signed)
[facility] Patient Name: Brianna [name] Procedure Date: 08/22/2017 1:42 PM MRN: [id number] Date of Birth: 09-Sep-1960 Attending MD: Barney Drain MD, MD CSN: [id number] Age: 57 Admit Type: Outpatient Procedure:                Upper GI endoscopy WITH PYLROIC/ESOPHAGEAL DILATION Indications:              Dyspepsia, Dysphagia, Weight loss Providers:                Barney Drain MD, MD, Aram Candela, Rosina Lowenstein, RN Referring MD:             Marlynn Perking Medicines:                Meperidine 25 mg IV, Midazolam 4 mg IV Complications:            No immediate complications. Estimated Blood Loss:     Estimated blood loss: none. Procedure:                Pre-Anesthesia Assessment:                           - Prior to the procedure, a History and Physical                            was performed, and patient medications and                            allergies were reviewed. The patient's tolerance of                            previous anesthesia was also reviewed. The risks                            and benefits of the procedure and the sedation                            options and risks were discussed with the patient.                            All questions were answered, and informed consent                            was obtained. Prior Anticoagulants: The patient has                            taken no previous anticoagulant or antiplatelet                            agents. ASA Grade Assessment: II - A patient with                            mild systemic disease. After reviewing the risks                            and benefits, the patient was deemed in  satisfactory condition to undergo the procedure.                            After obtaining informed consent, the endoscope was                            passed under direct vision. Throughout the                            procedure, the patient's blood pressure, pulse, and                             oxygen saturations were monitored continuously. The                            GIF-H190 ([id number]) scope was introduced through the                            mouth, and advanced to the second part of duodenum.                            The upper GI endoscopy was accomplished without                            difficulty. The patient tolerated the procedure                            fairly well. Scope In: 3:35:16 PM Scope Out: 3:45:58 PM Total Procedure Duration: 0 hours 10 minutes 42 seconds  Findings:      No endoscopic abnormality was evident in the esophagus to explain the       patient's complaint of dysphagia. It was decided, however, to proceed       with dilation of the entire esophagus DUE TO POSSIBLE PROXIMAL       ESOPHAGEAL WEB. A guidewire was placed and the scope was withdrawn.       Dilation was performed with a Savary dilator with mild resistance at 15       mm and moderate resistance at 16 mm and 17 mm. Estimated blood loss:       none.      A benign-appearing, intrinsic moderate stenosis was found at the       pylorus. This was traversed. A TTS dilator was passed through the scope.       Dilation with a 13.5 mm and a 15 mm pyloric balloon dilator was       performed. The dilation site was examined following endoscope       reinsertion and showed moderate improvement in luminal narrowing.      The examined duodenum was normal. Impression:               - No endoscopic esophageal abnormality to explain                            patient's dysphagia. Esophagus dilated. Dilated.                           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 The following parameters were       monitored: oxygen saturation, heart rate, blood pressure, and response       to care. Total physician intraservice time was 18  minutes. Recommendation:           - Patient has a contact number available for                            emergencies. The signs and symptoms of potential                            delayed complications were discussed with the                            patient. Return to normal activities tomorrow.                            Written discharge instructions were provided to the                            patient.                           - Low fat diet.                           - Continue present medications.                           - Return to my office in 4 months. CONSIDER                            PH/MANOMETRY/IMPEDANCE IF DYSPHAGIA PERSISTS. Procedure Code(s):        --- Professional ---                           [phone], Esophagogastroduodenoscopy, flexible,                            transoral; with dilation of gastric/duodenal                            stricture(s) (eg, balloon, bougie)                           43248, Esophagogastroduodenoscopy, flexible,                            transoral; with insertion of guide wire followed by                            passage of dilator(s) through esophagus over guide  wire                           G0500, Moderate sedation services provided by the                            same physician or other qualified health care                            professional performing a gastrointestinal                            endoscopic service that sedation supports,                            requiring the presence of an independent trained                            observer to assist in the monitoring of the                            patient's level of consciousness and physiological                            status; initial 15 minutes of intra-service time;                            patient age 10 years or older (additional time may                            be reported with [phone], as appropriate) Diagnosis  Code(s):        --- Professional ---                           R13.10, Dysphagia, unspecified                           K31.1, Adult hypertrophic pyloric stenosis                           R10.13, Epigastric pain                           R63.4, Abnormal weight loss CPT copyright 2017 American Medical Association. All rights reserved. The codes documented in this report are preliminary and upon coder review may  be revised to meet current compliance requirements. Barney Drain, MD Barney Drain MD, MD 08/22/2017 4:01:27 PM This report has been signed electronically. Number of Addenda: 0

## 2017-08-25 ENCOUNTER — Encounter (HOSPITAL_COMMUNITY): Payer: Self-pay | Admitting: Gastroenterology

## 2017-09-12 ENCOUNTER — Encounter: Payer: Self-pay | Admitting: Gastroenterology

## 2017-11-24 NOTE — Progress Notes (Signed)
REVIEWED-NO ADDITIONAL RECOMMENDATIONS. 

## 2017-12-06 ENCOUNTER — Ambulatory Visit (INDEPENDENT_AMBULATORY_CARE_PROVIDER_SITE_OTHER): Payer: BLUE CROSS/BLUE SHIELD | Admitting: Gastroenterology

## 2017-12-06 ENCOUNTER — Encounter: Payer: Self-pay | Admitting: Gastroenterology

## 2017-12-06 ENCOUNTER — Other Ambulatory Visit: Payer: Self-pay | Admitting: *Deleted

## 2017-12-06 DIAGNOSIS — R1310 Dysphagia, unspecified: Secondary | ICD-10-CM | POA: Diagnosis not present

## 2017-12-06 DIAGNOSIS — K219 Gastro-esophageal reflux disease without esophagitis: Secondary | ICD-10-CM

## 2017-12-06 DIAGNOSIS — K311 Adult hypertrophic pyloric stenosis: Secondary | ICD-10-CM

## 2017-12-06 DIAGNOSIS — K5901 Slow transit constipation: Secondary | ICD-10-CM

## 2017-12-06 NOTE — Assessment & Plan Note (Signed)
SYMPTOMS NOT IDEALLY CONTROLLED AND MAY BE EXACERBATED BY DECREASED PO INTALKE AND TUMS PRN.  DRINK WATER TO KEEP YOUR URINE LIGHT YELLOW. CHEW TUMS to relieve CHEST SYMPTOMS BUT IT CAN CAUSE CONSTIPATION. IF NEEDED USE MIRALAX TO PREVENT CONSTIPATION. FOLLOW UP IN 4 MOS.

## 2017-12-06 NOTE — Assessment & Plan Note (Signed)
SYMPTOMS CONTROLLED/RESOLVED.  CONTINUE TO MONITOR SYMPTOMS. 

## 2017-12-06 NOTE — Assessment & Plan Note (Signed)
SYMPTOMS FAIRLY WELL CONTROLLED UNLESS SHE FEELS FOOD STUCK IN HER ESOPHAGUS.  DRINK WATER TO KEEP YOUR URINE LIGHT YELLOW. CONTINUE PROTONIX. TAKE 30 MINUTES PRIOR TO BREAKFAST. CHEW TUMS to relieve CHEST SYMPTOMS BUT IT CAN CAUSE CONSTIPATION. COMPLETE THE SWALLOWING EVALUATION AFTER DEC 21 AT [facility]  FOLLOW UP IN 4 MOS.

## 2017-12-06 NOTE — Patient Instructions (Addendum)
DRINK WATER TO KEEP YOUR URINE LIGHT YELLOW.  FOLLOW A SOFT MECHANICAL DIET. MEATS SHOULD BE BAKED, BROILED, OR BOILED.  MEATS SHOULD BE CHOPPED OR GROUND ONLY. DO NOT EAT CHUNKS OF ANYTHING. SEE INFO BELOW.   CONTINUE PROTONIX. TAKE 30 MINUTES PRIOR TO BREAKFAST.  CHEW TUMS to relieve CHEST SYMPTOMS BUT IT CAN CAUSE CONSTIPATION.  IF NEEDED USE MIRALAX TO PREVENT CONSTIPATION.   COMPLETE THE SWALLOWING EVALUATION AFTER DEC 21 AT [facility].   FOLLOW UP IN 4 MOS.   SOFT MECHANICAL DIET This SOFT MECHANICAL DIET is restricted to:  Foods that are moist, soft-textured, and easy to chew and swallow.   Meats that are ground or are minced no larger than one-quarter inch pieces. Meats are moist with gravy or sauce added.   Foods that do not include bread or bread-like textures except soft pancakes, well-moistened with syrup or sauce.   Textures with some chewing ability required.   Casseroles without rice.   Cooked vegetables that are less than half an inch in size and easily mashed with a fork. No cooked corn, peas, broccoli, cauliflower, cabbage, Brussels sprouts, asparagus, or other fibrous, non-tender or rubbery cooked vegetables.   Canned fruit except for pineapple. Fruit must be cut into pieces no larger than half an inch in size.   Foods that do not include nuts, seeds, coconut, or sticky textures.   FOOD TEXTURES FOR DYSPHAGIA DIET LEVEL 2 -SOFT MECHANICAL DIET (includes all foods on Dysphagia Diet Level 1 - Pureed, in addition to the foods listed below)  FOOD GROUP: Breads. RECOMMENDED: Soft pancakes, well-moistened with syrup or sauce.  AVOID: All others.  FOOD GROUP: Cereals.  RECOMMENDED: Cooked cereals with little texture, including oatmeal. Unprocessed wheat bran stirred into cereals for bulk. Note: If thin liquids are restricted, it is important that all of the liquid is absorbed into the cereal.  AVOID: All dry cereals and any cooked cereals that may contain  flax seeds or other seeds or nuts. Whole-grain, dry, or coarse cereals. Cereals with nuts, seeds, dried fruit, and/or coconut.  FOOD GROUP: Desserts. RECOMMENDED: Pudding, custard. Soft fruit pies with bottom crust only. Canned fruit (excluding pineapple). Soft, moist cakes with icing.Frozen malts, milk shakes, frozen yogurt, eggnog, nutritional supplements, ice cream, sherbet, regular or sugar-free gelatin, or any foods that become thin liquid at either room (70 F) or body temperature (98 F).  AVOID: Dry, coarse cakes and cookies. Anything with nuts, seeds, coconut, pineapple, or dried fruit. Breakfast yogurt with nuts. Rice or bread pudding.  FOOD GROUP: Fats. RECOMMENDED: Butter, margarine, cream for cereal (depending on liquid consistency recommendations), gravy, cream sauces, sour cream, sour cream dips with soft additives, mayonnaise, salad dressings, cream cheese, cream cheese spreads with soft additives, whipped toppings.  AVOID: All fats with coarse or chunky additives.  FOOD GROUP: Fruits. RECOMMENDED: Soft drained, canned, or cooked fruits without seeds or skin. Fresh soft and ripe banana. Fruit juices with a small amount of pulp. If thin liquids are restricted, fruit juices should be thickened to appropriate consistency.  AVOID: Fresh or frozen fruits. Cooked fruit with skin or seeds. Dried fruits. Fresh, canned, or cooked pineapple.  FOOD GROUP: Meats and Meat Substitutes. (Meat pieces should not exceed 1/4 of an inch cube and should be tender.) RECOMMENDED: Moistened ground or cooked meat, poultry, or fish. Moist ground or tender meat may be served with gravy or sauce. Casseroles without rice. Moist macaroni and cheese, well-cooked pasta with meat sauce, tuna noodle casserole, soft,  moist lasagna. Moist meatballs, meatloaf, or fish loaf. Protein salads, such as tuna or egg without large chunks, celery, or onion. Cottage cheese, smooth quiche without large chunks. Poached,  scrambled, or soft-cooked eggs (egg yolks should not be "runny" but should be moist and able to be mashed with butter, margarine, or other moisture added to them). (Cook eggs to 160 F or use pasteurized eggs for safety.) Souffls may have small, soft chunks. Tofu. Well-cooked, slightly mashed, moist legumes, such as baked beans. All meats or protein substitutes should be served with sauces or moistened to help maintain cohesiveness in the oral cavity.  AVOID: Dry meats, tough meats (such as bacon, sausage, hot dogs, bratwurst). Dry casseroles or casseroles with rice or large chunks. Peanut butter. Cheese slices and cubes. Hard-cooked or crisp fried eggs. Sandwiches.Pizza.  FOOD GROUP: Potatoes and Starches. RECOMMENDED: Well-cooked, moistened, boiled, baked, or mashed potatoes. Well-cooked shredded hash brown potatoes that are not crisp. (All potatoes need to be moist and in sauces.)Well-cooked noodles in sauce. Spaetzel or soft dumplings that have been moistened with butter or gravy.  AVOID: Potato skins and chips. Fried or French-fried potatoes. Rice.  FOOD GROUP: Soups. RECOMMENDED: Soups with easy-to-chew or easy-to-swallow meats or vegetables: Particle sizes in soups should be less than 1/2 inch. Soups will need to be thickened to appropriate consistency if soup is thinner than prescribed liquid consistency.  AVOID: Soups with large chunks of meat and vegetables. Soups with rice, corn, peas.  FOOD GROUP: Vegetables. RECOMMENDED: All soft, well-cooked vegetables. Vegetables should be less than a half inch. Should be easily mashed with a fork.  AVOID: Cooked corn and peas. Broccoli, cabbage, Brussels sprouts, asparagus, or other fibrous, non-tender or rubbery cooked vegetables.  FOOD GROUP: Miscellaneous. RECOMMENDED: Jams and preserves without seeds, jelly. Sauces, salsas, etc., that may have small tender chunks less than 1/2 inch. Soft, smooth chocolate bars that are easily  chewed.  AVOID: Seeds, nuts, coconut, or sticky foods. Chewy candies such as caramels or licorice.

## 2017-12-06 NOTE — Assessment & Plan Note (Signed)
SYMPTOMS NOT IDEALLY CONTROLLED AFTER EGD/DIL TO 17 mm AUG 2019. DIFFERENTIAL DIAGNOSIS INCLUDES: PRIMARY ESOPHAGEAL MOTILITY DISORDER,OR 2o ESOPHAGEAL MOTILITY DISORDER DUE TO UNCONTROLLED REFLUX.  DRINK WATER TO KEEP YOUR URINE LIGHT YELLOW.  FOLLOW A SOFT MECHANICAL DIET. MEATS SHOULD BE BAKED, BROILED, OR BOILED.  MEATS SHOULD BE CHOPPED OR GROUND ONLY. DO NOT EAT CHUNKS OF ANYTHING.  HANDOUT GIVEN. CONTINUE PROTONIX. TAKE 30 MINUTES PRIOR TO BREAKFAST. CHEW TUMS to relieve CHEST SYMPTOMS BUT IT CAN CAUSE CONSTIPATION. COMPLETE THE SWALLOWING EVALUATION AFTER DEC 21 AT [facility]. GAVE A DESCRIPTION OF WHAT THE PROCEDURE ENTAILS. FOLLOW UP IN 4 MOS.

## 2017-12-06 NOTE — Progress Notes (Signed)
Subjective:     Patient ID: Brianna [name], female   DOB: Jun 15, 1960, 57 y.o.   MRN: [id number]  PCP:  Angelina Sheriff, [place].  HPI Still having TROUBLE WITH REFLUX AND FEELING THINGS FOOD IS SITTING IN HER ESOPHAGUS. CHEWS TUMS AND BREAKS IT DOWN AND THEN HAS A BM(SOLID AND THEN DIARRHEA). HAPPENS EVERY 2-3 DAYS. DID AWAY WITH SODAS AND AVOID SPICY FOODS AND IT SEEMED TO MAKE IT BETTER. SOLID FOOD GOES DOWN AND STOP IN THE MIDDLE OF HER CHEST. EGD/DIL HELPED BUT SYMPTOMS NOT HAS STTUCK FINGER DOWN HER THROAT TO GET FOOD BACK UP WHEN IT'S UNBEARABLE. TWICE SINCE THE UPPER ENDOSCOPY. BURNING CHEST PAIN HAPPENS: EVERY 2-3 DAYS. PATTERN OF BMs CAN GO EVERY DAY AND NOW Q2-3 DAYS. NOW FEELS CONSTIPATED. FEEL SHEARTBURN WOULD BE CONTROLLED IF FOOD DIDN'T GET STUCK IN HER ESOPHAGUS.  PT DENIES FEVER, CHILLS, HEMATOCHEZIA, HEMATEMESIS, nausea,  melena, SHORTNESS OF BREATH, CHANGE IN BOWEL IN HABITS, OR  abdominal pain, problems swallowing.  Past Medical History:  Diagnosis Date  . Diabetes mellitus ([place])   . Gastritis   . GERD without esophagitis     Past Surgical History:  Procedure Laterality Date  . CARPAL TUNNEL RELEASE     left  . COLONOSCOPY WITH ESOPHAGOGASTRODUODENOSCOPY (EGD) N/A 04/02/2012   SLF: SMALL RECTAL polyp/Small internal hemorrhoids. Polyp hyperplastic. On EGD she had mild gastritis. Biopsy showed chronic and active gastritis and no H. pylori. Next colonoscopy in 10 years with overtube/? Propofol  . ESOPHAGEAL DILATION  12/09/2016   Procedure: ESOPHAGEAL DILATION;  Surgeon: Danie Binder, MD;  [facility];  Service: Endoscopy;;  . ESOPHAGOGASTRODUODENOSCOPY  03/2012   SLF: gastritis  . ESOPHAGOGASTRODUODENOSCOPY N/A 12/09/2016   Procedure: ESOPHAGOGASTRODUODENOSCOPY (EGD);  Surgeon: Danie Binder, MD;  [facility];  Service: Endoscopy;  Laterality: N/A;  11:30am  . ESOPHAGOGASTRODUODENOSCOPY N/A 08/22/2017   Procedure:  ESOPHAGOGASTRODUODENOSCOPY (EGD);  Surgeon: Danie Binder, MD;  [facility];  Service: Endoscopy;  Laterality: N/A;  11:30am  . PARTIAL HYSTERECTOMY    . SAVORY DILATION N/A 08/22/2017   Procedure: SAVORY DILATION;  Surgeon: Danie Binder, MD;  [facility];  Service: Endoscopy;  Laterality: N/A;  . TRIGGER FINGER RELEASE Left    Allergies  Allergen Reactions  . Other Hives and Shortness Of Breath    seafood  . Iodine Hives    shellfish  . Neosporin [Neomycin-Bacitracin Zn-Polymyx] Rash    Current Outpatient Medications  Medication Sig    . acetaminophen (TYLENOL) 500 MG tablet Take 500 mg by mouth every 6 (six) hours as needed for moderate pain.    Marland Kitchen amitriptyline (ELAVIL) 50 MG tablet Take 50 mg by mouth at bedtime.    . Canagliflozin-Metformin HCl (INVOKAMET) 50-1000 MG TABS Take 1 tablet by mouth every morning.     Marland Kitchen ibuprofen (ADVIL,MOTRIN) 800 MG tablet Take 800 mg by mouth every 8 (eight) hours as needed for mild pain.    . Insulin Aspart (NOVOLOG [place]) Inject 3-10 Units into the skin See admin instructions. 3 units in am, 10 units at bedtime    . ONE TOUCH ULTRA TEST test strip     . pantoprazole (PROTONIX) 40 MG tablet Take 1 tablet (40 mg total) by mouth daily before breakfast.    . polyethylene glycol (MIRALAX / GLYCOLAX) packet Take 17 g by mouth daily as needed for mild constipation.     .       Review of  Systems PER HPI OTHERWISE ALL SYSTEMS ARE NEGATIVE.    Objective:   Physical Exam  Constitutional: She is oriented to person, place, and time. She appears well-developed and well-nourished. No distress.  HENT:  Head: Normocephalic and atraumatic.  Mouth/Throat: Oropharynx is clear and moist. No oropharyngeal exudate.  Eyes: Pupils are equal, round, and reactive to light. No scleral icterus.  Neck: Normal range of motion. Neck supple.  Cardiovascular: Normal rate, regular rhythm and normal heart sounds.  Pulmonary/Chest: Effort normal and  breath sounds normal. No respiratory distress.  Abdominal: Soft. Bowel sounds are normal. She exhibits no distension. There is no tenderness.  Musculoskeletal: She exhibits no edema.  Lymphadenopathy:    She has no cervical adenopathy.  Neurological: She is alert and oriented to person, place, and time.  NO  NEW FOCAL DEFICITS  Psychiatric:  FLAT AFFECT, NL MOOD  Vitals reviewed.     Assessment:         Plan:

## 2017-12-14 ENCOUNTER — Telehealth: Payer: Self-pay | Admitting: Gastroenterology

## 2017-12-14 ENCOUNTER — Other Ambulatory Visit (HOSPITAL_COMMUNITY): Payer: Self-pay | Admitting: Gastroenterology

## 2017-12-14 MED ORDER — AMITRIPTYLINE HCL 50 MG PO TABS: 50.0000 mg | ORAL_TABLET | Freq: Every day | ORAL | 3 refills | Status: DC

## 2017-12-14 NOTE — Telephone Encounter (Signed)
Referral has already been sent to [place]. Called and informed pt. [facility] should be contacting her. Gave her phone number to [facility] if needed.

## 2017-12-14 NOTE — Telephone Encounter (Signed)
PATIENT CALLED AND SAID WE WERE SUPPOSED TO REFER HER TO HAVE AN "EATING STUDY"   PLEASE CALL HER

## 2017-12-14 NOTE — Addendum Note (Signed)
Addended by: Mahala Menghini on: 12/14/2017 12:50 PM   Modules accepted: Orders

## 2017-12-20 ENCOUNTER — Ambulatory Visit: Payer: BLUE CROSS/BLUE SHIELD | Admitting: Gastroenterology

## 2017-12-28 ENCOUNTER — Telehealth: Payer: Self-pay

## 2017-12-28 NOTE — Telephone Encounter (Signed)
Procedure scheduled at [facility] 01/10/2018 at 8:30 am arrive by 8:15 am. NPO after 4:30 am Hold morning insulin. Other medications as per her normal. Patient instructed.

## 2018-01-02 ENCOUNTER — Telehealth: Payer: Self-pay | Admitting: *Deleted

## 2018-01-02 NOTE — Telephone Encounter (Signed)
Brianna [name], [place], [place]        Hi Brianna [name],   Pt is scheduled for Manometry on 01-10-18 @ 8:30am at [facility] w/Dr. Silverio Decamp. Pt is aware of appt   Thanks  [facility]

## 2018-01-04 ENCOUNTER — Telehealth: Payer: Self-pay | Admitting: Gastroenterology

## 2018-01-04 NOTE — Telephone Encounter (Signed)
Spoke with the patient and moved her esophageal mano and ph impedence study to 12:30 on 01/10/18. She understands to arrive between 12 noon and 12:15.

## 2018-01-04 NOTE — Telephone Encounter (Signed)
Pt needs to speak with you regarding her proce at [facility] on 01/10/18 at 8:30am. She is looking to r/s for a later time that same day since she is coming from [place].

## 2018-01-10 ENCOUNTER — Encounter (HOSPITAL_COMMUNITY)
Admission: RE | Disposition: A | Discharge status: Home / Self Care | Payer: Self-pay | Source: Home / Self Care | Attending: Gastroenterology

## 2018-01-10 ENCOUNTER — Ambulatory Visit (HOSPITAL_COMMUNITY)
Admission: RE | Admit: 2018-01-10 | Discharge: 2018-01-10 | Disposition: A | Discharge status: Home / Self Care | Payer: BLUE CROSS/BLUE SHIELD | Attending: Gastroenterology | Admitting: Gastroenterology

## 2018-01-10 DIAGNOSIS — K219 Gastro-esophageal reflux disease without esophagitis: Secondary | ICD-10-CM | POA: Diagnosis not present

## 2018-01-10 DIAGNOSIS — R1110 Vomiting, unspecified: Secondary | ICD-10-CM | POA: Insufficient documentation

## 2018-01-10 DIAGNOSIS — R12 Heartburn: Secondary | ICD-10-CM

## 2018-01-10 DIAGNOSIS — R1310 Dysphagia, unspecified: Secondary | ICD-10-CM | POA: Diagnosis present

## 2018-01-10 DIAGNOSIS — K224 Dyskinesia of esophagus: Secondary | ICD-10-CM | POA: Insufficient documentation

## 2018-01-10 HISTORY — PX: 24 HOUR PH STUDY: SHX5419

## 2018-01-10 HISTORY — PX: PH IMPEDANCE STUDY: SHX5565

## 2018-01-10 HISTORY — PX: ESOPHAGEAL MANOMETRY: SHX5429

## 2018-01-10 SURGERY — MANOMETRY, ESOPHAGUS

## 2018-01-10 MED ORDER — LIDOCAINE VISCOUS HCL 2 % MT SOLN
OROMUCOSAL | Status: AC
  Filled 2018-01-10: qty 15

## 2018-01-10 SURGICAL SUPPLY — 2 items
FACESHIELD LNG OPTICON STERILE (SAFETY) IMPLANT
GLOVE BIO SURGEON STRL SZ8 (GLOVE) ×8 IMPLANT

## 2018-01-10 NOTE — Progress Notes (Addendum)
Esophageal Manometry done per protocol.  Patient unable to tolerate laying back farther than 45 degrees.  Study was done at that angle and patient tolerated well.  24 hour pH probe was placed at 40 cm.  Patient verbalized understanding of all instructions and equipment use.  Patient will return tomorrow at 1325 to have probe removed.  Reports to be sent to [facility] tomorrow upon patients return.

## 2018-01-11 ENCOUNTER — Encounter (HOSPITAL_COMMUNITY): Payer: Self-pay | Admitting: Gastroenterology

## 2018-01-11 DIAGNOSIS — R1310 Dysphagia, unspecified: Secondary | ICD-10-CM | POA: Diagnosis not present

## 2018-01-12 DIAGNOSIS — R12 Heartburn: Secondary | ICD-10-CM

## 2018-01-15 ENCOUNTER — Telehealth: Payer: Self-pay | Admitting: Gastroenterology

## 2018-01-15 DIAGNOSIS — R1310 Dysphagia, unspecified: Secondary | ICD-10-CM

## 2018-01-15 NOTE — Telephone Encounter (Signed)
Pt said she asked the report to be sent to Dr. Oneida Alar to discuss. I told her Dr. Oneida Alar is not in today, but will be at the hospital tomorrow and I will let her know she has called.

## 2018-01-15 NOTE — Telephone Encounter (Signed)
PATIENT CALLED TO LET us KNOW SHE HAD HER PROCEDURE DONE LAST WEEK, AND IS INQUIRING ABOUT THE RESULTS

## 2018-01-16 NOTE — Telephone Encounter (Signed)
PT is aware to call back in the afternoon of 01/18/2018.

## 2018-01-16 NOTE — Telephone Encounter (Signed)
PLEASE CALL PT. THE STUDY WAS DONE WED JAN 8. WE WILL CONTACT [facility] TO SEE IF STUDY HAS BEEN READ. SHE SHOULD CAN BACK JAN 16 FOR AN UP DATE.

## 2018-01-18 NOTE — Addendum Note (Signed)
Addended by: Inge Rise on: 01/18/2018 03:59 PM   Modules accepted: Orders

## 2018-01-18 NOTE — Telephone Encounter (Signed)
Procedure notes is under the notes tab and the patient can be reached at  [phone].  Routing to Dr. Oneida Alar

## 2018-01-18 NOTE — Telephone Encounter (Signed)
Referral placed.

## 2018-01-18 NOTE — Telephone Encounter (Signed)
PLEASE CALL PT. HER SWALLOWING STUDY SHOWS A WEAK ESOPHAGUS. IT ONLY PUSHES FID THROUGH EFFECTIVELY 50% OF THE TIME. THIS CAN BE RELATED AN AUTOIMMUNE DISORDER.FOLLOW A SOFT MECHANICAL DIET.  MEATS SHOULD BE GROUND ONLY. DO NOT EAT CHUNKS OF ANYTHING. SEE RHEUMATOLOGY FOR AN EVALUATION (DX; DYSPHAGIA, EVALUATE FOR SCLERODERMA OR MIXED CONNECTIVE TISSUE DISORDER). FOLLOW UP IN 4 MOS WITH DR. FIELDS.

## 2018-01-18 NOTE — Telephone Encounter (Signed)
PT is aware and OK to refer to Rheumatology.

## 2018-01-19 ENCOUNTER — Encounter: Payer: Self-pay | Admitting: Gastroenterology

## 2018-01-19 NOTE — Telephone Encounter (Signed)
OV made 05/03/2018 at 130 with SF and letter mailed

## 2018-01-22 NOTE — Progress Notes (Signed)
Office Visit Note  Patient: Brianna [name]             Date of Birth: 10-Jul-1960           MRN: [id number]             PCP: Patient, No Pcp Per Referring: Danie Binder, MD Visit Date: 02/01/2018 Occupation: Mental Health Counselor at [employer]  Subjective:   Dysphagia biggest complaint.  She states she here esophagus stretched twice.    History of Present Illness: Brianna [name] is a 58 y.o. female in consultation per request of Dr. Oneida Alar.  According to patient in June 2019 she started having difficulty swallowing.  In September 2019 she underwent esophageal stretching which lasted only for 2 days.  She has been restarted experiencing lot of gas and reflux.  She underwent another esophageal stretching which did not work either.  She had an endoscopy which was normal.  In December she had esophageal motility study which showed decreased motility and poor bolus clearance.  She also had a pH study which was consistent with reflux.  Gives history  of chronic constipation.  She denies any history of joint pain or joint swelling.  There is no history of rainouts phenomenon.  Activities of Daily Living:  Patient reports morning stiffness for 0  minute.   Patient Denies nocturnal pain.  Difficulty dressing/grooming: Denies Difficulty climbing stairs: Denies Difficulty getting out of chair: Denies Difficulty using hands for taps, buttons, cutlery, and/or writing: Denies  Review of Systems  Constitutional: Positive for fatigue.  HENT: Positive for mouth dryness. Negative for mouth sores and nose dryness.   Eyes: Negative for pain, visual disturbance and dryness.  Respiratory: Negative for cough, hemoptysis, shortness of breath and difficulty breathing.   Cardiovascular: Negative for chest pain, palpitations, hypertension and swelling in legs/feet.  Gastrointestinal: Positive for constipation, diarrhea and heartburn. Negative for blood in stool.  Endocrine: Negative for  cold intolerance and increased urination.  Genitourinary: Negative for painful urination.  Musculoskeletal: Negative for arthralgias, joint pain, joint swelling, myalgias, muscle weakness, morning stiffness, muscle tenderness and myalgias.  Skin: Negative for color change, pallor, rash, hair loss, nodules/bumps, skin tightness, ulcers and sensitivity to sunlight.  Allergic/Immunologic: Negative for susceptible to infections.  Neurological: Negative for dizziness, numbness, headaches and weakness.  Hematological: Negative for swollen glands.  Psychiatric/Behavioral: Positive for depressed mood. Negative for sleep disturbance. The patient is nervous/anxious.     PMFS History:  Patient Active Problem List   Diagnosis Date Noted  . Heartburn   . Adult hypertrophic pyloric stenosis   . Dysphagia 08/16/2017  . GERD (gastroesophageal reflux disease) 11/21/2016  . Heart rate fast 04/21/2014  . Left flank pain 08/27/2012  . Loss of weight 08/27/2012  . Constipation 03/12/2012  . Abdominal wall pain 03/12/2012    Past Medical History:  Diagnosis Date  . Diabetes mellitus ([place])   . Gastritis   . GERD without esophagitis     Family History  Problem Relation Age of Onset  . Stroke Mother   . Breast cancer Mother   . Diabetes Brother   . Stroke Brother   . Lupus Sister   . Diabetes Father   . Healthy Son   . Healthy Son    Past Surgical History:  Procedure Laterality Date  . [place] STUDY  01/10/2018   Procedure: [place] STUDY;  Surgeon: Mauri Pole, MD;  [facility];  Service: Endoscopy;;  . CARPAL TUNNEL  RELEASE     left  . COLONOSCOPY WITH ESOPHAGOGASTRODUODENOSCOPY (EGD) N/A 04/02/2012   SLF: SMALL RECTAL polyp/Small internal hemorrhoids. Polyp hyperplastic. On EGD she had mild gastritis. Biopsy showed chronic and active gastritis and no H. pylori. Next colonoscopy in 10 years with overtube/? Propofol  . ESOPHAGEAL DILATION  12/09/2016   Procedure:  ESOPHAGEAL DILATION;  Surgeon: Danie Binder, MD;  [facility];  Service: Endoscopy;;  . ESOPHAGEAL MANOMETRY N/A 01/10/2018   Procedure: ESOPHAGEAL MANOMETRY (EM);  Surgeon: Mauri Pole, MD;  [facility];  Service: Endoscopy;  Laterality: N/A;  . ESOPHAGOGASTRODUODENOSCOPY  03/2012   SLF: gastritis  . ESOPHAGOGASTRODUODENOSCOPY N/A 12/09/2016   Procedure: ESOPHAGOGASTRODUODENOSCOPY (EGD);  Surgeon: Danie Binder, MD;  [facility];  Service: Endoscopy;  Laterality: N/A;  11:30am  . ESOPHAGOGASTRODUODENOSCOPY N/A 08/22/2017   Procedure: ESOPHAGOGASTRODUODENOSCOPY (EGD);  Surgeon: Danie Binder, MD;  [facility];  Service: Endoscopy;  Laterality: N/A;  11:30am  . PARTIAL HYSTERECTOMY    . [place] IMPEDANCE STUDY N/A 01/10/2018   Procedure: [place] IMPEDANCE STUDY;  Surgeon: Mauri Pole, MD;  [facility];  Service: Endoscopy;  Laterality: N/A;  . SAVORY DILATION N/A 08/22/2017   Procedure: SAVORY DILATION;  Surgeon: Danie Binder, MD;  [facility];  Service: Endoscopy;  Laterality: N/A;  . TRIGGER FINGER RELEASE Left    Social History   Social History Narrative   Lives w/ husband 2 kids-17/20 healthy. STILL WORKING AT [employer].    There is no immunization history on file for this patient.   Objective: Vital Signs: BP 114/72 (BP Location: Right Arm, Patient Position: Sitting, Cuff Size: Normal)   Pulse (!) 110   Resp 14   Ht 5' 7.5" (1.715 m)   Wt 166 lb (75.3 kg)   BMI 25.62 kg/m    Physical Exam Vitals signs and nursing note reviewed.  Constitutional:      Appearance: She is well-developed.  HENT:     Head: Normocephalic and atraumatic.  Eyes:     Conjunctiva/sclera: Conjunctivae normal.  Neck:     Musculoskeletal: Normal range of motion.  Cardiovascular:     Rate and Rhythm: Normal rate and regular rhythm.     Heart sounds: Normal heart sounds.    Pulmonary:     Effort: Pulmonary effort is normal.     Breath sounds: Normal breath sounds.  Abdominal:     General: Bowel sounds are normal.     Palpations: Abdomen is soft.  Lymphadenopathy:     Cervical: No cervical adenopathy.  Skin:    General: Skin is warm and dry.     Capillary Refill: Capillary refill takes less than 2 seconds.  Neurological:     Mental Status: She is alert and oriented to person, place, and time.  Psychiatric:        Behavior: Behavior normal.      Musculoskeletal Exam: Spine thoracic lumbar spine good range of motion.  Shoulder joints elbow joints wrist joint MCPs PIPs DIPs been good range of motion with no synovitis.  Hip joints knee joints ankles MTPs PIPs been good range of motion with no synovitis.  CDAI Exam: CDAI Score: Not documented Patient Global Assessment: Not documented; Provider Global Assessment: Not documented Swollen: Not documented; Tender: Not documented Joint Exam   Not documented   There is currently no information documented on the homunculus. Go to the Rheumatology activity and  complete the homunculus joint exam.  Investigation: No additional findings.  Imaging: No results found.  Recent Labs: Lab Results  Component Value Date   WBC 6.5 11/14/2016   HGB 12.5 11/14/2016   PLT 366 11/14/2016   NA 137 11/14/2016   K 3.2 (L) 11/14/2016   CL 101 11/14/2016   CO2 30 11/14/2016   GLUCOSE 224 (H) 11/14/2016   BUN 15 11/14/2016   CREATININE 0.54 11/14/2016   BILITOT 0.3 11/14/2016   ALKPHOS 115 11/14/2016   AST 13 (L) 11/14/2016   ALT 11 (L) 11/14/2016   PROT 7.6 11/14/2016   ALBUMIN 3.6 11/14/2016   CALCIUM 9.3 11/14/2016   GFRAA >60 11/14/2016   Chart was reviewed-which showed results of endoscopy, pH study and esophageal manometry. Speciality Comments: No specialty comments available.  Procedures:  No procedures performed Allergies: Other; Iodine; and Neosporin [neomycin-bacitracin zn-polymyx]   Assessment /  Plan:     Visit Diagnoses: Dysphagia, idiopathic -I reviewed patient's chart.  Studies reveal reflux and decreased esophageal motility.  She was referred to evaluate for scleroderma/mixed connective tissue disease.  Patient has no evidence of sclerodactyly, rainouts, nipple nailbed capillary changes, telangiectasias.  To complete the work-up I will obtain following labs today.- Plan: ANA, Anti-scleroderma antibody, RNP Antibody, Anti-Smith antibody, Sjogrens syndrome-A extractable nuclear antibody, Sjogrens syndrome-B extractable nuclear antibody, Anti-DNA antibody, double-stranded, C3 and C4  Slow transit constipation-patient complains of chronic constipation.  Adult hypertrophic pyloric stenosis  History of gastroesophageal reflux (GERD)-avoidance of NSAIDs was discussed.  History of diabetes mellitus  Family history of systemic lupus erythematosus - Sister  Other fatigue - Plan: CBC with Differential/Platelet, COMPLETE METABOLIC PANEL WITH GFR, Urinalysis, Routine w reflex microscopic, CK, TSH, Sedimentation rate   Orders: Orders Placed This Encounter  Procedures  . CBC with Differential/Platelet  . COMPLETE METABOLIC PANEL WITH GFR  . Urinalysis, Routine w reflex microscopic  . CK  . TSH  . Sedimentation rate  . ANA  . Anti-scleroderma antibody  . RNP Antibody  . Anti-Smith antibody  . Sjogrens syndrome-A extractable nuclear antibody  . Sjogrens syndrome-B extractable nuclear antibody  . Anti-DNA antibody, double-stranded  . C3 and C4   No orders of the defined types were placed in this encounter.   Face-to-face time spent with patient was 45 minutes. Greater than 50% of time was spent in counseling and coordination of care.  Follow-Up Instructions: Return for Esophageal dysmotility.   Bo Merino, MD  Note - This record has been created using Editor, commissioning.  Chart creation errors have been sought, but may not always  have been located. Such creation errors do  not reflect on  the standard of medical care.

## 2018-01-23 DIAGNOSIS — R12 Heartburn: Secondary | ICD-10-CM

## 2018-02-01 ENCOUNTER — Encounter (INDEPENDENT_AMBULATORY_CARE_PROVIDER_SITE_OTHER): Payer: Self-pay

## 2018-02-01 ENCOUNTER — Encounter: Payer: Self-pay | Admitting: Rheumatology

## 2018-02-01 ENCOUNTER — Ambulatory Visit (INDEPENDENT_AMBULATORY_CARE_PROVIDER_SITE_OTHER): Payer: BLUE CROSS/BLUE SHIELD | Admitting: Rheumatology

## 2018-02-01 VITALS — BP 114/72 | HR 110 | Resp 14 | Ht 67.5 in | Wt 166.0 lb

## 2018-02-01 DIAGNOSIS — R1310 Dysphagia, unspecified: Secondary | ICD-10-CM | POA: Diagnosis not present

## 2018-02-01 DIAGNOSIS — R5383 Other fatigue: Secondary | ICD-10-CM

## 2018-02-01 DIAGNOSIS — K311 Adult hypertrophic pyloric stenosis: Secondary | ICD-10-CM | POA: Diagnosis not present

## 2018-02-01 DIAGNOSIS — Z8719 Personal history of other diseases of the digestive system: Secondary | ICD-10-CM

## 2018-02-01 DIAGNOSIS — Z8639 Personal history of other endocrine, nutritional and metabolic disease: Secondary | ICD-10-CM

## 2018-02-01 DIAGNOSIS — Z8269 Family history of other diseases of the musculoskeletal system and connective tissue: Secondary | ICD-10-CM

## 2018-02-01 DIAGNOSIS — K5901 Slow transit constipation: Secondary | ICD-10-CM | POA: Diagnosis not present

## 2018-02-02 LAB — ANTI-SMITH ANTIBODY: ENA SM Ab Ser-aCnc: <1 AI

## 2018-02-02 LAB — CBC WITH DIFFERENTIAL/PLATELET
Absolute Monocytes: 350 cells/uL (ref 200–950)
Basophils Absolute: 60 cells/uL (ref 0–200)
Basophils Relative: 1.3 %
Eosinophils Absolute: 239 cells/uL (ref 15–500)
Eosinophils Relative: 5.2 %
HCT: 38.2 % (ref 35.0–45.0)
Hemoglobin: 12.9 g/dL (ref 11.7–15.5)
Lymphs Abs: 1946 cells/uL (ref 850–3900)
MCH: 28.9 pg (ref 27.0–33.0)
MCHC: 33.8 g/dL (ref 32.0–36.0)
MCV: 85.5 fL (ref 80.0–100.0)
MPV: 11.1 fL (ref 7.5–12.5)
Monocytes Relative: 7.6 %
Neutro Abs: 2006 cells/uL (ref 1500–7800)
Neutrophils Relative %: 43.6 %
Platelets: 298 10*3/uL (ref 140–400)
RBC: 4.47 10*6/uL (ref 3.80–5.10)
RDW: 13 % (ref 11.0–15.0)
Total Lymphocyte: 42.3 %
WBC: 4.6 10*3/uL (ref 3.8–10.8)

## 2018-02-02 LAB — COMPLETE METABOLIC PANEL WITH GFR
AG Ratio: 1.2 (calc) (ref 1.0–2.5)
ALT: 16 U/L (ref 6–29)
AST: 15 U/L (ref 10–35)
Albumin: 3.7 g/dL (ref 3.6–5.1)
Alkaline phosphatase (APISO): 130 U/L (ref 33–130)
BUN: 14 mg/dL (ref 7–25)
CO2: 30 mmol/L (ref 20–32)
Calcium: 9.6 mg/dL (ref 8.6–10.4)
Chloride: 100 mmol/L (ref 98–110)
Creat: 0.76 mg/dL (ref 0.50–1.05)
GFR, Est African American: 102 mL/min/{1.73_m2} (ref >=60)
GFR, Est Non African American: 88 mL/min/{1.73_m2} (ref >=60)
Globulin: 3 g/dL (calc) (ref 1.9–3.7)
Glucose, Bld: 371 mg/dL — ABNORMAL HIGH (ref 65–99)
Potassium: 4.4 mmol/L (ref 3.5–5.3)
Sodium: 137 mmol/L (ref 135–146)
Total Bilirubin: 0.3 mg/dL (ref 0.2–1.2)
Total Protein: 6.7 g/dL (ref 6.1–8.1)

## 2018-02-02 LAB — URINALYSIS, ROUTINE W REFLEX MICROSCOPIC
Bilirubin Urine: NEGATIVE
Hyaline Cast: NONE SEEN /LPF
Ketones, ur: NEGATIVE
Nitrite: NEGATIVE
Protein, ur: NEGATIVE
Specific Gravity, Urine: 1.017 (ref 1.001–1.03)
Squamous Epithelial / LPF: NONE SEEN /HPF (ref <=5)
pH: <=5 (ref 5.0–8.0)

## 2018-02-02 LAB — C3 AND C4
C3 Complement: 159 mg/dL (ref 83–193)
C4 Complement: 32 mg/dL (ref 15–57)

## 2018-02-02 LAB — SEDIMENTATION RATE: Sed Rate: 31 mm/h — ABNORMAL HIGH (ref 0–30)

## 2018-02-02 LAB — RNP ANTIBODY: Ribonucleic Protein(ENA) Antibody, IgG: <1 AI

## 2018-02-02 LAB — TSH: TSH: 1.36 mIU/L

## 2018-02-02 LAB — CK: Total CK: 99 U/L (ref 29–143)

## 2018-02-02 LAB — ANTI-DNA ANTIBODY, DOUBLE-STRANDED: ds DNA Ab: <1 IU/mL

## 2018-02-02 LAB — ANTI-SCLERODERMA ANTIBODY: Scleroderma (Scl-70) (ENA) Antibody, IgG: <1 AI

## 2018-02-02 LAB — SJOGRENS SYNDROME-A EXTRACTABLE NUCLEAR ANTIBODY: SSA (Ro) (ENA) Antibody, IgG: <1 AI

## 2018-02-02 LAB — ANA: Anti Nuclear Antibody(ANA): NEGATIVE

## 2018-02-02 LAB — SJOGRENS SYNDROME-B EXTRACTABLE NUCLEAR ANTIBODY: SSB (La) (ENA) Antibody, IgG: <1 AI

## 2018-02-02 NOTE — Progress Notes (Signed)
UA can be c/w UTI. Pl advise appt with PCP for Tt

## 2018-02-04 NOTE — Progress Notes (Signed)
Pl notify Pt that bl glu level is elevated and  forward labs to her PCP . I will discuss results at the fu visit.

## 2018-02-23 DIAGNOSIS — Z8639 Personal history of other endocrine, nutritional and metabolic disease: Secondary | ICD-10-CM | POA: Insufficient documentation

## 2018-02-23 DIAGNOSIS — Z8269 Family history of other diseases of the musculoskeletal system and connective tissue: Secondary | ICD-10-CM | POA: Insufficient documentation

## 2018-02-23 NOTE — Progress Notes (Deleted)
Office Visit Note  Patient: Brianna [name]             Date of Birth: 04-07-1960           MRN: [id number]             PCP: Patient, No Pcp Per Referring: No ref. provider found Visit Date: 03/09/2018 Occupation: @GUAROCC @  Subjective:  No chief complaint on file.   History of Present Illness: Brianna [name] is a 58 y.o. female ***   Activities of Daily Living:  Patient reports morning stiffness for *** {minute/hour:19697}.   Patient {ACTIONS;DENIES/REPORTS:21021675::"Denies"} nocturnal pain.  Difficulty dressing/grooming: {ACTIONS;DENIES/REPORTS:21021675::"Denies"} Difficulty climbing stairs: {ACTIONS;DENIES/REPORTS:21021675::"Denies"} Difficulty getting out of chair: {ACTIONS;DENIES/REPORTS:21021675::"Denies"} Difficulty using hands for taps, buttons, cutlery, and/or writing: {ACTIONS;DENIES/REPORTS:21021675::"Denies"}  No Rheumatology ROS completed.   PMFS History:  Patient Active Problem List   Diagnosis Date Noted  . Heartburn   . Adult hypertrophic pyloric stenosis   . Dysphagia 08/16/2017  . GERD (gastroesophageal reflux disease) 11/21/2016  . Heart rate fast 04/21/2014  . Left flank pain 08/27/2012  . Loss of weight 08/27/2012  . Constipation 03/12/2012  . Abdominal wall pain 03/12/2012    Past Medical History:  Diagnosis Date  . Diabetes mellitus ([place])   . Gastritis   . GERD without esophagitis     Family History  Problem Relation Age of Onset  . Stroke Mother   . Breast cancer Mother   . Diabetes Brother   . Stroke Brother   . Lupus Sister   . Diabetes Father   . Healthy Son   . Healthy Son    Past Surgical History:  Procedure Laterality Date  . [place] STUDY  01/10/2018   Procedure: [place] STUDY;  Surgeon: Mauri Pole, MD;  [facility];  Service: Endoscopy;;  . CARPAL TUNNEL RELEASE     left  . COLONOSCOPY WITH ESOPHAGOGASTRODUODENOSCOPY (EGD) N/A 04/02/2012   SLF: SMALL RECTAL polyp/Small internal  hemorrhoids. Polyp hyperplastic. On EGD she had mild gastritis. Biopsy showed chronic and active gastritis and no H. pylori. Next colonoscopy in 10 years with overtube/? Propofol  . ESOPHAGEAL DILATION  12/09/2016   Procedure: ESOPHAGEAL DILATION;  Surgeon: Danie Binder, MD;  [facility];  Service: Endoscopy;;  . ESOPHAGEAL MANOMETRY N/A 01/10/2018   Procedure: ESOPHAGEAL MANOMETRY (EM);  Surgeon: Mauri Pole, MD;  [facility];  Service: Endoscopy;  Laterality: N/A;  . ESOPHAGOGASTRODUODENOSCOPY  03/2012   SLF: gastritis  . ESOPHAGOGASTRODUODENOSCOPY N/A 12/09/2016   Procedure: ESOPHAGOGASTRODUODENOSCOPY (EGD);  Surgeon: Danie Binder, MD;  [facility];  Service: Endoscopy;  Laterality: N/A;  11:30am  . ESOPHAGOGASTRODUODENOSCOPY N/A 08/22/2017   Procedure: ESOPHAGOGASTRODUODENOSCOPY (EGD);  Surgeon: Danie Binder, MD;  [facility];  Service: Endoscopy;  Laterality: N/A;  11:30am  . PARTIAL HYSTERECTOMY    . [place] IMPEDANCE STUDY N/A 01/10/2018   Procedure: [place] IMPEDANCE STUDY;  Surgeon: Mauri Pole, MD;  [facility];  Service: Endoscopy;  Laterality: N/A;  . SAVORY DILATION N/A 08/22/2017   Procedure: SAVORY DILATION;  Surgeon: Danie Binder, MD;  [facility];  Service: Endoscopy;  Laterality: N/A;  . TRIGGER FINGER RELEASE Left    Social History   Social History Narrative   Lives w/ husband 2 kids-17/20 healthy. STILL WORKING AT [employer].    There is no immunization history on file for this patient.   Objective: Vital  Signs: There were no vitals taken for this visit.   Physical Exam   Musculoskeletal Exam: ***  CDAI Exam: CDAI Score: Not documented Patient Global Assessment: Not documented; Provider Global Assessment: Not documented Swollen: Not documented; Tender: Not documented Joint Exam   Not documented   There is currently no information  documented on the homunculus. Go to the Rheumatology activity and complete the homunculus joint exam.  Investigation: No additional findings.  Imaging: No results found.  Recent Labs: Lab Results  Component Value Date   WBC 4.6 02/01/2018   HGB 12.9 02/01/2018   PLT 298 02/01/2018   NA 137 02/01/2018   K 4.4 02/01/2018   CL 100 02/01/2018   CO2 30 02/01/2018   GLUCOSE 371 (H) 02/01/2018   BUN 14 02/01/2018   CREATININE 0.76 02/01/2018   BILITOT 0.3 02/01/2018   ALKPHOS 115 11/14/2016   AST 15 02/01/2018   ALT 16 02/01/2018   PROT 6.7 02/01/2018   ALBUMIN 3.6 11/14/2016   CALCIUM 9.6 02/01/2018   GFRAA 102 02/01/2018  ENA negative, ENA negative, C3-C4 normal, ESR 31, CK normal, TSH normal, UA positive for leukocytes and bacteria  Speciality Comments: No specialty comments available.  Procedures:  No procedures performed Allergies: Other; Iodine; and Neosporin [neomycin-bacitracin zn-polymyx]   Assessment / Plan:     Visit Diagnoses: No diagnosis found.   Orders: No orders of the defined types were placed in this encounter.  No orders of the defined types were placed in this encounter.   Face-to-face time spent with patient was *** minutes. Greater than 50% of time was spent in counseling and coordination of care.  Follow-Up Instructions: No follow-ups on file.   Bo Merino, MD  Note - This record has been created using Editor, commissioning.  Chart creation errors have been sought, but may not always  have been located. Such creation errors do not reflect on  the standard of medical care.

## 2018-03-05 ENCOUNTER — Telehealth: Payer: Self-pay

## 2018-03-05 NOTE — Telephone Encounter (Signed)
PT called and said she saw the Rheumatologist that Dr. Oneida Alar referred her to. Said she was told she had a bladder infection but was not given any antibiotics. I told her she should be seeing PCP for UTI. She said she had called but could not get in right away. I told her she should have told then that the Rheumatologist told her to see PCP for UTI. She said she will call them back today and will let me know if she gets in. She had her UA on 02/01/2018.

## 2018-03-05 NOTE — Telephone Encounter (Signed)
REVIEWED. AGREE. NO ADDITIONAL RECOMMENDATIONS. 

## 2018-03-06 ENCOUNTER — Telehealth: Payer: Self-pay | Admitting: Rheumatology

## 2018-03-06 NOTE — Progress Notes (Deleted)
Office Visit Note  Patient: Brianna [name]             Date of Birth: [birth year]           MRN: [id number]             PCP: Patient, No Pcp Per Referring: No ref. provider found Visit Date: 03/20/2018 Occupation: _0 @  Subjective:  No chief complaint on file.   History of Present Illness: Brianna [name] is a 58 y.o. female ***   Activities of Daily Living:  Patient reports morning stiffness for *** {minute/hour:19697}.   Patient {ACTIONS;DENIES/REPORTS:21021675::"Denies"} nocturnal pain.  Difficulty dressing/grooming: {ACTIONS;DENIES/REPORTS:21021675::"Denies"} Difficulty climbing stairs: {ACTIONS;DENIES/REPORTS:21021675::"Denies"} Difficulty getting out of chair: {ACTIONS;DENIES/REPORTS:21021675::"Denies"} Difficulty using hands for taps, buttons, cutlery, and/or writing: {ACTIONS;DENIES/REPORTS:21021675::"Denies"}  No Rheumatology ROS completed.   PMFS History:  Patient Active Problem List   Diagnosis Date Noted  . Family history of systemic lupus erythematosus 02/23/2018  . History of diabetes mellitus 02/23/2018  . Heartburn   . Adult hypertrophic pyloric stenosis   . Dysphagia 08/16/2017  . GERD (gastroesophageal reflux disease) 11/21/2016  . Heart rate fast 04/21/2014  . Left flank pain 08/27/2012  . Loss of weight 08/27/2012  . Constipation 03/12/2012  . Abdominal wall pain 03/12/2012    Past Medical History:  Diagnosis Date  . Diabetes mellitus ([place])   . Gastritis   . GERD without esophagitis     Family History  Problem Relation Age of Onset  . Stroke Mother   . Breast cancer Mother   . Diabetes Brother   . Stroke Brother   . Lupus Sister   . Diabetes Father   . Healthy Son   . Healthy Son    Past Surgical History:  Procedure Laterality Date  . [place] STUDY  01/10/2018   Procedure: [place] STUDY;  Surgeon: Mauri Pole, MD;  [facility];  Service: Endoscopy;;  . CARPAL TUNNEL RELEASE     left  .  COLONOSCOPY WITH ESOPHAGOGASTRODUODENOSCOPY (EGD) N/A 04/02/2012   SLF: SMALL RECTAL polyp/Small internal hemorrhoids. Polyp hyperplastic. On EGD she had mild gastritis. Biopsy showed chronic and active gastritis and no H. pylori. Next colonoscopy in 10 years with overtube/? Propofol  . ESOPHAGEAL DILATION  12/09/2016   Procedure: ESOPHAGEAL DILATION;  Surgeon: Danie Binder, MD;  [facility];  Service: Endoscopy;;  . ESOPHAGEAL MANOMETRY N/A 01/10/2018   Procedure: ESOPHAGEAL MANOMETRY (EM);  Surgeon: Mauri Pole, MD;  [facility];  Service: Endoscopy;  Laterality: N/A;  . ESOPHAGOGASTRODUODENOSCOPY  03/2012   SLF: gastritis  . ESOPHAGOGASTRODUODENOSCOPY N/A 12/09/2016   Procedure: ESOPHAGOGASTRODUODENOSCOPY (EGD);  Surgeon: Danie Binder, MD;  [facility];  Service: Endoscopy;  Laterality: N/A;  11:30am  . ESOPHAGOGASTRODUODENOSCOPY N/A 08/22/2017   Procedure: ESOPHAGOGASTRODUODENOSCOPY (EGD);  Surgeon: Danie Binder, MD;  [facility];  Service: Endoscopy;  Laterality: N/A;  11:30am  . PARTIAL HYSTERECTOMY    . [place] IMPEDANCE STUDY N/A 01/10/2018   Procedure: [place] IMPEDANCE STUDY;  Surgeon: Mauri Pole, MD;  [facility];  Service: Endoscopy;  Laterality: N/A;  . SAVORY DILATION N/A 08/22/2017   Procedure: S[place]ORY DILATION;  Surgeon: Danie Binder, MD;  [facility];  Service: Endoscopy;  Laterality: N/A;  . TRIGGER FINGER RELEASE Left    Social History   Social History Narrative   Lives w/ husband 2 kids-17/20 healthy. STILL WORKING AT THE [employer].  There is no immunization history on file for this patient.   Objective: Vital Signs: There were no vitals taken for this visit.   Physical Exam   Musculoskeletal Exam: ***  CDAI Exam: CDAI Score: Not documented Patient Global Assessment: Not documented; Provider Global Assessment: Not documented Swollen: Not  documented; Tender: Not documented Joint Exam   Not documented   There is currently no information documented on the homunculus. Go to the Rheumatology activity and complete the homunculus joint exam.  Investigation: No additional findings.  Imaging: No results found.  Recent Labs: Lab Results  Component Value Date   WBC 4.6 02/01/2018   HGB 12.9 02/01/2018   PLT 298 02/01/2018   NA 137 02/01/2018   K 4.4 02/01/2018   CL 100 02/01/2018   CO2 30 02/01/2018   GLUCOSE 371 (H) 02/01/2018   BUN 14 02/01/2018   CREATININE 0.76 02/01/2018   BILITOT 0.3 02/01/2018   ALKPHOS 115 11/14/2016   AST 15 02/01/2018   ALT 16 02/01/2018   PROT 6.7 02/01/2018   ALBUMIN 3.6 11/14/2016   CALCIUM 9.6 02/01/2018   GFRAA 102 02/01/2018  February 01, 2018 CK 99, TSH normal, ESR 31, ANA negative, ENA negative, C3-C4 normal, UA showed 2+ leukocytes and many bacteria  Speciality Comments: No specialty comments available.  Procedures:  No procedures performed Allergies: Other; Iodine; and Neosporin [neomycin-bacitracin zn-polymyx]   Assessment / Plan:     Visit Diagnoses: No diagnosis found.   Orders: No orders of the defined types were placed in this encounter.  No orders of the defined types were placed in this encounter.   Face-to-face time spent with patient was *** minutes. Greater than 50% of time was spent in counseling and coordination of care.  Follow-Up Instructions: No follow-ups on file.   Bo Merino, MD  Note - This record has been created using Editor, commissioning.  Chart creation errors have been sought, but may not always  have been located. Such creation errors do not reflect on  the standard of medical care.

## 2018-03-06 NOTE — Telephone Encounter (Signed)
Patient called requesting a return call to let her know if she needs to keep her NPT FU appointment.

## 2018-03-07 NOTE — Telephone Encounter (Signed)
Patient advised to keep her new patient follow up appointment as Dr. Estanislado Pandy reviews all of her new patient labs with her. Patient verbalized understanding.

## 2018-03-09 ENCOUNTER — Ambulatory Visit: Payer: BLUE CROSS/BLUE SHIELD | Admitting: Rheumatology

## 2018-03-20 ENCOUNTER — Ambulatory Visit: Payer: BLUE CROSS/BLUE SHIELD | Admitting: Rheumatology

## 2018-04-23 ENCOUNTER — Other Ambulatory Visit: Payer: Self-pay | Admitting: Gastroenterology

## 2018-05-03 ENCOUNTER — Ambulatory Visit (INDEPENDENT_AMBULATORY_CARE_PROVIDER_SITE_OTHER): Payer: BLUE CROSS/BLUE SHIELD | Admitting: Gastroenterology

## 2018-05-03 ENCOUNTER — Other Ambulatory Visit: Payer: Self-pay

## 2018-05-03 ENCOUNTER — Encounter: Payer: Self-pay | Admitting: Gastroenterology

## 2018-05-03 DIAGNOSIS — R1310 Dysphagia, unspecified: Secondary | ICD-10-CM

## 2018-05-03 DIAGNOSIS — K219 Gastro-esophageal reflux disease without esophagitis: Secondary | ICD-10-CM | POA: Diagnosis not present

## 2018-05-03 DIAGNOSIS — R1319 Other dysphagia: Secondary | ICD-10-CM

## 2018-05-03 NOTE — Assessment & Plan Note (Addendum)
CLINICALLY IMPROVED. SYMPTOMS FAIRLY WELL CONTROLLED. 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  CONTINUE TO MONITOR SYMPTOMS. CALL WITH QUESTIONS OR CONCERNS. REVIEWED RHEUMA LABS. RESULTS NEGATIVE. PT NEEDS RHEUMATOLOGY F/U. FOLLOW UP IN 6 MOS.

## 2018-05-03 NOTE — Progress Notes (Signed)
Subjective:    Patient ID: Brianna [name], female    DOB: [birth year], 58 y.o.   MRN: [id number]   Primary Care Physician:  DR. TILAPIA  Primary GI:  Barney Drain, MD   Patient Location: home   Provider Location: [place] office   Reason for Visit: DYSPHAGIA/CONSTIPATION   Persons present on the virtual encounter, with roles: patient, myself (provider), MARTINA BOOTH CMA (update meds/allergies)   Total time (minutes) spent on medical discussion:   10 MINUTES   Due to COVID-19, visit was VIA TELEPHONE VISIT DUE TO COVID 19. VISIT IS CONDUCTED VIRTUALLY AND WAS REQUESTED BY PATIENT.   Virtual Visit via TELEPHONE   I connected with  Kimberli NAJEE-AKRAM and verified that I am speaking with the correct person using two identifiers.   I discussed the limitations, risks, security and privacy concerns of performing an evaluation and management service by telephone/video and the availability of in person appointments. I also discussed with the patient that there may be a patient responsible charge related to this service. The patient expressed understanding and agreed to proceed.   HPI No questions or concerns. SWALLOWING BETTER WITH A SOFT MECHANICAL DIET. HEARTBURN BETTER. SAW RHEUMATOLOGY BUT COVID RESTRICTIONS NOT SEEN. NO DEFINITE DIAGNOSIS. TRESIBA AT 50 U GAVE HER DIARRHEA. CUT BACK TO 30 U BECAUSE SHE CAN'T GET THROUGH TO HER DOCTOR'S OFC. RARE CONSTIPATION: HARD BALLS AND THEN LOOSE STOOLS. BMs: Q2 DAYS.  PT DENIES FEVER, CHILLS, HEMATOCHEZIA, HEMATEMESIS, nausea, vomiting, melena,CHEST PAIN, SHORTNESS OF BREATH, CHANGE IN BOWEL IN HABITS, abdominal pain, problems swallowing, OR heartburn or indigestion.  Past Medical History:  Diagnosis Date  . Diabetes mellitus ([place])   . Gastritis   . GERD without esophagitis     Past Surgical History:  Procedure Laterality Date  . [place] STUDY  01/10/2018   Procedure: [place] STUDY;  Surgeon: Mauri Pole, MD;  [facility];  Service: Endoscopy;;  . CARPAL TUNNEL RELEASE     left  . COLONOSCOPY WITH ESOPHAGOGASTRODUODENOSCOPY (EGD) N/A 04/02/2012   SLF: SMALL RECTAL polyp/Small internal hemorrhoids. Polyp hyperplastic. On EGD she had mild gastritis. Biopsy showed chronic and active gastritis and no H. pylori. Next colonoscopy in 10 years with overtube/? Propofol  . ESOPHAGEAL DILATION  12/09/2016   Procedure: ESOPHAGEAL DILATION;  Surgeon: Danie Binder, MD;  [facility];  Service: Endoscopy;;  . ESOPHAGEAL MANOMETRY N/A 01/10/2018   Procedure: ESOPHAGEAL MANOMETRY (EM);  Surgeon: Mauri Pole, MD;  [facility];  Service: Endoscopy;  Laterality: N/A;  . ESOPHAGOGASTRODUODENOSCOPY  03/2012   SLF: gastritis  . ESOPHAGOGASTRODUODENOSCOPY N/A 12/09/2016   Procedure: ESOPHAGOGASTRODUODENOSCOPY (EGD);  Surgeon: Danie Binder, MD;  [facility];  Service: Endoscopy;  Laterality: N/A;  11:30am  . ESOPHAGOGASTRODUODENOSCOPY N/A 08/22/2017   Procedure: ESOPHAGOGASTRODUODENOSCOPY (EGD);  Surgeon: Danie Binder, MD;  [facility];  Service: Endoscopy;  Laterality: N/A;  11:30am  . PARTIAL HYSTERECTOMY    . [place] IMPEDANCE STUDY N/A 01/10/2018   Procedure: [place] IMPEDANCE STUDY;  Surgeon: Mauri Pole, MD;  [facility];  Service: Endoscopy;  Laterality: N/A;  . SAVORY DILATION N/A 08/22/2017   Procedure: SAVORY DILATION;  Surgeon: Danie Binder, MD;  [facility];  Service: Endoscopy;  Laterality: N/A;  . TRIGGER FINGER RELEASE Left    Allergies  Allergen Reactions  . Other Hives and Shortness Of Breath    seafood  . Iodine Hives  shellfish  . Neosporin [Neomycin-Bacitracin Zn-Polymyx] Rash    Current Outpatient Medications  Medication Sig    . acetaminophen (TYLENOL) 500 MG tablet Take 500 mg by mouth every 6 (six) hours as needed for moderate pain.    Marland Kitchen amitriptyline (ELAVIL) 50 MG tablet TAKE 1 TABLET BY MOUTH AT BEDTIME     . ibuprofen (ADVIL,MOTRIN) 800 MG tablet Take 800 mg by mouth every 8 (eight) hours as needed for mild pain.    . Insulin Degludec (TRESIBA) 100 UNIT/ML SOLN Inject 50 Units into the skin at bedtime.    . ONE TOUCH ULTRA TEST test strip     . pantoprazole (PROTONIX) 40 MG tablet Take 1 tablet (40 mg total) by mouth daily before breakfast.    . polyethylene glycol (MIRALAX / GLYCOLAX) packet Take 17 g by mouth daily as needed for mild constipation.  QOD   .      Marland Kitchen Insulin Aspart (NOVOLOG [place]) Inject 3-10 Units into the skin See admin instructions. 3 units in am, 10 units at bedtime    . Vitamin D, Ergocalciferol, (DRISDOL) 50000 units CAPS capsule Take 50,000 Units by mouth every 7 (seven) days. Wednesdays     Review of Systems PER HPI OTHERWISE ALL SYSTEMS ARE NEGATIVE.    Objective:   Physical Exam   TELEPHONE VISIT DUE TO COVID 19, VISIT IS CONDUCTED VIRTUALLY AND WAS REQUESTED BY PATIENT.     Assessment & Plan:

## 2018-05-03 NOTE — Assessment & Plan Note (Signed)
SYMPTOMS FAIRLY WELL CONTROLLED.  CONTINUE TO MONITOR SYMPTOMS. CONTINUE PROTONIX. TAKE 30 MINUTES PRIOR TO BREAKFAST. FOLLOW UP IN 6 MOS.

## 2018-05-07 NOTE — Progress Notes (Signed)
ON RECALL  °

## 2018-07-30 ENCOUNTER — Other Ambulatory Visit: Payer: Self-pay | Admitting: Nurse Practitioner

## 2018-08-03 ENCOUNTER — Telehealth: Payer: Self-pay

## 2018-08-03 NOTE — Telephone Encounter (Signed)
Pt called and said she had requested a refill from pharmacy for Amitriptyline.  I told her it was filled on 07/30/2018 by Neil Crouch, PA and receipt confirmed by pharmacy on 07/30/2018 at 4:18 pm.  She will check with pharmacy when they open and let me know if there is a problem.

## 2018-10-03 ENCOUNTER — Encounter: Payer: Self-pay | Admitting: Gastroenterology

## 2018-11-11 DIAGNOSIS — N179 Acute kidney failure, unspecified: Secondary | ICD-10-CM | POA: Insufficient documentation

## 2018-11-11 DIAGNOSIS — N12 Tubulo-interstitial nephritis, not specified as acute or chronic: Secondary | ICD-10-CM | POA: Insufficient documentation

## 2018-12-19 ENCOUNTER — Ambulatory Visit (INDEPENDENT_AMBULATORY_CARE_PROVIDER_SITE_OTHER): Payer: BC Managed Care – PPO | Admitting: Gastroenterology

## 2018-12-19 ENCOUNTER — Other Ambulatory Visit: Payer: Self-pay | Admitting: *Deleted

## 2018-12-19 ENCOUNTER — Encounter: Payer: Self-pay | Admitting: Gastroenterology

## 2018-12-19 ENCOUNTER — Other Ambulatory Visit: Payer: Self-pay

## 2018-12-19 DIAGNOSIS — D649 Anemia, unspecified: Secondary | ICD-10-CM

## 2018-12-19 DIAGNOSIS — R1319 Other dysphagia: Secondary | ICD-10-CM

## 2018-12-19 DIAGNOSIS — R1310 Dysphagia, unspecified: Secondary | ICD-10-CM

## 2018-12-19 DIAGNOSIS — R809 Proteinuria, unspecified: Secondary | ICD-10-CM

## 2018-12-19 MED ORDER — VITAMIN D 25 MCG (1000 UNIT) PO TABS: 2000.0000 [IU] | ORAL_TABLET | Freq: Every day | ORAL | 11 refills | Status: AC

## 2018-12-19 MED ORDER — VITAMIN D (ERGOCALCIFEROL) 1.25 MG (50000 UNIT) PO CAPS: 50000.0000 [IU] | ORAL_CAPSULE | ORAL | 0 refills | Status: DC

## 2018-12-19 MED ORDER — BIFERA 28 MG PO TABS: ORAL_TABLET | ORAL | 11 refills | Status: DC

## 2018-12-19 NOTE — Assessment & Plan Note (Signed)
SYMPTOMS NOT IDEALLY CONTROLLED AND MAY BE DUE TO PRIMARY BONE MARROW DISORDER V. OCCULT GI BLOOD LOSS.  COMPLETE blood draw and COLLECT URINE SAMPLE. SEE HEMATOLOGY FOR LOW BLOOD COUNT. TRY BIFERA TWICE DAILY. FOLLOW UP IN 4 MOS.

## 2018-12-19 NOTE — Patient Instructions (Addendum)
YOU NEED VITAMIN D 50,000 UNITS A WEEK FOR 8 WEEKS THEN START VITAMIN D 2000 UNITS DAILY FOREVER.  START CALCIUM 500 MG THREE TIMES A DAY FOREVER.    COMPLETE blood draw and COLLECT URINE SAMPLE.  SEE HEMATOLOGY FOR LOW BLOOD COUNT.  SEE RHEUMATOLOGY FOR FOLLOW UP APPT TO DISCUSS WHETHER YOU HAVE SCLERODERMA OR MIXED CONNECTIVE TISSU DISORDER AND ASK ABOUT FOR ADDITIONAL WORKUP.  TRY BIFERA TWICE DAILY.  FOLLOW UP IN 4 MOS.

## 2018-12-19 NOTE — Assessment & Plan Note (Signed)
DUE TO ESOPHAGEAL MOTILITY DISORDER.  SEE RHEUMATOLOGY FOR FOLLOW UP APPT TO DISCUSS WHETHER YOU HAVE SCLERODERMA OR MIXED CONNECTIVE TISSU DISORDER AND ASK ABOUT FOR ADDITIONAL WORKUP. FOLLOW UP IN 4 MOS.

## 2018-12-19 NOTE — Progress Notes (Signed)
Subjective:    Patient ID: Brianna [name], female    DOB: 08/06/1960, 58 y.o.   MRN: [id number]  Patient, No Pcp Per  HPI WAS IN [facility] 5 DAYS WITH UROSEPSIS. LABS SHOWS PROTEIN IN URINE. LOW ALBUMIN, NO PROTEIN AND NL Cr. SAW BLACK TARRY STOOLS:  NOV x1. AND ONCE WHILE ADMITTED. BMs: EVERY 3 DAYS(HARD THEN RECTAL URGENCY/WATERY-ONCE A WEEK). TAKING MIRALAX: EVERY OTHER DAY. FEELS SOB WITH EXERTION. FEELS LIKE FOOD GETS STUCK UNTIL SHE THROWS UP. DYSPHAGIA HAPPENS EVERY 3 DAYS. HEARTBURN: ALL THE TIME.CHEWS TUMS, PEPTO, AND ALKA SELTZER(HELPS BEST)  PT DENIES FEVER, CHILLS, HEMATOCHEZIA, HEMATEMESIS, nausea, HEMATEMESIS, CHEST PAIN, CHANGE IN BOWEL IN HABITS, constipation, abdominal pain, problems with sedation, OR heartburn or indigestion.  Past Medical History:  Diagnosis Date  . Diabetes mellitus ([place])   . Gastritis   . GERD without esophagitis    Past Surgical History:  Procedure Laterality Date  . [place] STUDY  01/10/2018   Procedure: [place] STUDY;  Surgeon: Mauri Pole, MD;  [facility];  Service: Endoscopy;;  . CARPAL TUNNEL RELEASE     left  . COLONOSCOPY WITH ESOPHAGOGASTRODUODENOSCOPY (EGD) N/A 04/02/2012   SLF: SMALL RECTAL polyp/Small internal hemorrhoids. Polyp hyperplastic. On EGD she had mild gastritis. Biopsy showed chronic and active gastritis and no H. pylori. Next colonoscopy in 10 years with overtube/? Propofol  . ESOPHAGEAL DILATION  12/09/2016   Procedure: ESOPHAGEAL DILATION;  Surgeon: Danie Binder, MD;  [facility];  Service: Endoscopy;;  . ESOPHAGEAL MANOMETRY N/A 01/10/2018   Procedure: ESOPHAGEAL MANOMETRY (EM);  Surgeon: Mauri Pole, MD;  [facility];  Service: Endoscopy;  Laterality: N/A;  . ESOPHAGOGASTRODUODENOSCOPY  03/2012   SLF: gastritis  . ESOPHAGOGASTRODUODENOSCOPY N/A 12/09/2016   Procedure: ESOPHAGOGASTRODUODENOSCOPY (EGD);  Surgeon: Danie Binder, MD;  [facility];   Service: Endoscopy;  Laterality: N/A;  11:30am  . ESOPHAGOGASTRODUODENOSCOPY N/A 08/22/2017   Procedure: ESOPHAGOGASTRODUODENOSCOPY (EGD);  Surgeon: Danie Binder, MD;  [facility];  Service: Endoscopy;  Laterality: N/A;  11:30am  . PARTIAL HYSTERECTOMY    . [place] IMPEDANCE STUDY N/A 01/10/2018   Procedure: [place] IMPEDANCE STUDY;  Surgeon: Mauri Pole, MD;  [facility];  Service: Endoscopy;  Laterality: N/A;  . SAVORY DILATION N/A 08/22/2017   Procedure: SAVORY DILATION;  Surgeon: Danie Binder, MD;  [facility];  Service: Endoscopy;  Laterality: N/A;  . TRIGGER FINGER RELEASE Left    Allergies  Allergen Reactions  . Other Hives and Shortness Of Breath    seafood  . Iodine Hives    shellfish  . Neosporin [Neomycin-Bacitracin Zn-Polymyx] Rash    Current Outpatient Medications  Medication Sig    . acetaminophen (TYLENOL) 500 MG tablet Take 500 mg by mouth every 6 (six) hours as needed for moderate pain.    Marland Kitchen amitriptyline (ELAVIL) 50 MG tablet TAKE 1 TABLET BY MOUTH AT BEDTIME    . ascorbic acid (VITAMIN C) 500 MG tablet Take 500 mg by mouth daily.    . carvedilol (COREG) 6.25 MG tablet Take 1 tablet by mouth every 12 (twelve) hours.    . ferrous sulfate 325 (65 FE) MG tablet Take 325 mg by mouth 3 (three) times daily with meals.    Marland Kitchen ibuprofen (ADVIL,MOTRIN) 800 MG tablet Take 800 mg by mouth every 8 (eight) hours as needed for mild pain.    . Insulin Aspart (NOVOLOG [place]) Inject 15 Units  into the skin See admin instructions. 15 units at breakfast, lunch and dinner    . Insulin Degludec (TRESIBA) 100 UNIT/ML SOLN Inject 25-60 Units into the skin 2 (two) times daily. Takes 25 units in morning and 60 units at night    . ONE TOUCH ULTRA TEST test strip     . polyethylene glycol (MIRALAX / GLYCOLAX) packet Take 17 g by mouth daily as needed for mild constipation.     . Canagliflozin-Metformin HCl (INVOKAMET) 50-1000 MG TABS Take 1 tablet by mouth every  morning.     . furosemide (LASIX) 20 MG tablet Take 20 mg by mouth daily.    .      . Vitamin D, Ergocalciferol, (DRISDOL) 50000 units CAPS capsule Take 50,000 Units by mouth every 7 (seven) days. Wednesdays. LAST DOSE???     Review of Systems PER HPI OTHERWISE ALL SYSTEMS ARE NEGATIVE.     Objective:   Physical Exam Constitutional:      General: She is not in acute distress.    Appearance: Normal appearance.  HENT:     Mouth/Throat:     Comments: MASK IN PLACE Eyes:     General: No scleral icterus.    Pupils: Pupils are equal, round, and reactive to light.  Cardiovascular:     Rate and Rhythm: Normal rate and regular rhythm.     Pulses: Normal pulses.     Heart sounds: Normal heart sounds.  Pulmonary:     Effort: Pulmonary effort is normal.     Breath sounds: Normal breath sounds.  Abdominal:     General: Bowel sounds are normal.     Palpations: Abdomen is soft.     Tenderness: There is no abdominal tenderness.  Musculoskeletal:     Cervical back: Normal range of motion.     Right lower leg: No edema.     Left lower leg: No edema.  Lymphadenopathy:     Cervical: No cervical adenopathy.  Skin:    General: Skin is warm and dry.  Neurological:     Mental Status: She is alert and oriented to person, place, and time.     Comments: NO  NEW FOCAL DEFICITS  Psychiatric:        Mood and Affect: Mood normal.     Comments: NORMAL AFFECT           Assessment & Plan:

## 2018-12-20 NOTE — Progress Notes (Signed)
ON RECALL  °

## 2018-12-24 ENCOUNTER — Ambulatory Visit (HOSPITAL_COMMUNITY): Payer: BC Managed Care – PPO | Admitting: Hematology

## 2018-12-24 ENCOUNTER — Encounter (HOSPITAL_COMMUNITY): Payer: Self-pay | Admitting: Hematology

## 2018-12-24 ENCOUNTER — Other Ambulatory Visit: Payer: Self-pay

## 2018-12-24 ENCOUNTER — Inpatient Hospital Stay (HOSPITAL_COMMUNITY): Payer: BC Managed Care – PPO | Attending: Hematology | Admitting: Hematology

## 2018-12-24 ENCOUNTER — Inpatient Hospital Stay (HOSPITAL_COMMUNITY): Payer: BC Managed Care – PPO

## 2018-12-24 VITALS — BP 143/63 | HR 108 | Temp 97.9°F | Resp 18 | Ht 66.0 in | Wt 172.9 lb

## 2018-12-24 DIAGNOSIS — Z794 Long term (current) use of insulin: Secondary | ICD-10-CM | POA: Insufficient documentation

## 2018-12-24 DIAGNOSIS — Z803 Family history of malignant neoplasm of breast: Secondary | ICD-10-CM | POA: Insufficient documentation

## 2018-12-24 DIAGNOSIS — Z90710 Acquired absence of both cervix and uterus: Secondary | ICD-10-CM | POA: Insufficient documentation

## 2018-12-24 DIAGNOSIS — E119 Type 2 diabetes mellitus without complications: Secondary | ICD-10-CM | POA: Diagnosis not present

## 2018-12-24 DIAGNOSIS — Z833 Family history of diabetes mellitus: Secondary | ICD-10-CM | POA: Diagnosis not present

## 2018-12-24 DIAGNOSIS — D649 Anemia, unspecified: Secondary | ICD-10-CM

## 2018-12-24 DIAGNOSIS — Z823 Family history of stroke: Secondary | ICD-10-CM | POA: Diagnosis not present

## 2018-12-24 DIAGNOSIS — Z79899 Other long term (current) drug therapy: Secondary | ICD-10-CM | POA: Diagnosis not present

## 2018-12-24 LAB — RETICULOCYTES
Immature Retic Fract: 8.8 % (ref 2.3–15.9)
RBC.: 3.7 MIL/uL — ABNORMAL LOW (ref 3.87–5.11)
Retic Count, Absolute: 76.6 10*3/uL (ref 19.0–186.0)
Retic Ct Pct: 2.1 % (ref 0.4–3.1)

## 2018-12-24 LAB — CBC WITH DIFFERENTIAL/PLATELET
Abs Immature Granulocytes: 0.01 10*3/uL (ref 0.00–0.07)
Basophils Absolute: 0.1 10*3/uL (ref 0.0–0.1)
Basophils Relative: 2 %
Eosinophils Absolute: 0.2 10*3/uL (ref 0.0–0.5)
Eosinophils Relative: 4 %
HCT: 31.6 % — ABNORMAL LOW (ref 36.0–46.0)
Hemoglobin: 10.2 g/dL — ABNORMAL LOW (ref 12.0–15.0)
Immature Granulocytes: 0 %
Lymphocytes Relative: 38 %
Lymphs Abs: 1.8 10*3/uL (ref 0.7–4.0)
MCH: 27.6 pg (ref 26.0–34.0)
MCHC: 32.3 g/dL (ref 30.0–36.0)
MCV: 85.4 fL (ref 80.0–100.0)
Monocytes Absolute: 0.4 10*3/uL (ref 0.1–1.0)
Monocytes Relative: 8 %
Neutro Abs: 2.3 10*3/uL (ref 1.7–7.7)
Neutrophils Relative %: 48 %
Platelets: 333 10*3/uL (ref 150–400)
RBC: 3.7 MIL/uL — ABNORMAL LOW (ref 3.87–5.11)
RDW: 14 % (ref 11.5–15.5)
WBC: 4.7 10*3/uL (ref 4.0–10.5)
nRBC: 0 % (ref 0.0–0.2)

## 2018-12-24 LAB — IRON AND TIBC
Iron: 43 ug/dL (ref 28–170)
Saturation Ratios: 16 % (ref 10.4–31.8)
TIBC: 281 ug/dL (ref 250–450)
UIBC: 236 ug/dL

## 2018-12-24 LAB — FOLATE: Folate: 9.2 ng/mL (ref >5.9)

## 2018-12-24 LAB — LACTATE DEHYDROGENASE: LDH: 179 U/L (ref 98–192)

## 2018-12-24 LAB — FERRITIN: Ferritin: 55 ng/mL (ref 11–307)

## 2018-12-24 LAB — VITAMIN B12: Vitamin B-12: 280 pg/mL (ref 180–914)

## 2018-12-24 NOTE — Progress Notes (Signed)
CONSULT NOTE  Patient Care Team: Patient, No Pcp Per as PCP - General (General Practice) Fields, Marga Melnick, MD as Attending Physician (Gastroenterology)  CHIEF COMPLAINTS/PURPOSE OF CONSULTATION:  Normocytic anemia.  HISTORY OF PRESENTING ILLNESS:  Brianna [name] 58 y.o. female seen in consultation today at the request of Dr. Oneida Alar for further work-up and management of normocytic anemia.  She was reportedly admitted at [facility] on 11/10/2018 through 11/14/2018 with urosepsis.  Her hemoglobin on admission was 10.9 with MCV of 85.  Hemoglobin on 11/14/2018 was 8.5 with MCV of 85.  White count and platelets were normal.  She reported one episode of melena while she was in the hospital.  Denies any bleeding per rectum.  Denies any fevers, night sweats or weight loss in the past few months.  Denies any history of blood transfusion.  She had a hysterectomy approximately 10 years ago.  Reports shortness of breath on exertion but denies any chest pains, presyncopal episodes or palpitations.  Last EGD and colonoscopy was on 04/02/2012 which showed rectal hyperplastic polyp.  Chronic active gastritis, negative for H. pylori found on EGD.  Denies any family history of anemias including sickle cell or thalassemia.  Family history significant for mother with breast cancer at age 31.  Patient works at [employer] with kids.  She had a remote history of smoking.  Denies using any NSAIDs or other blood thinners.  She follows up with Dr. Malvin Johns in [place].   MEDICAL HISTORY:  Past Medical History:  Diagnosis Date  . Diabetes mellitus ([place])   . Gastritis   . GERD without esophagitis     SURGICAL HISTORY: Past Surgical History:  Procedure Laterality Date  . [place] STUDY  01/10/2018   Procedure: [place] STUDY;  Surgeon: Mauri Pole, MD;  Location: [facility];  Service: Endoscopy;;  . CARPAL TUNNEL RELEASE     left  . COLONOSCOPY WITH  ESOPHAGOGASTRODUODENOSCOPY (EGD) N/A 04/02/2012   SLF: SMALL RECTAL polyp/Small internal hemorrhoids. Polyp hyperplastic. On EGD she had mild gastritis. Biopsy showed chronic and active gastritis and no H. pylori. Next colonoscopy in 10 years with overtube/? Propofol  . ESOPHAGEAL DILATION  12/09/2016   Procedure: ESOPHAGEAL DILATION;  Surgeon: Danie Binder, MD;  [facility];  Service: Endoscopy;;  . ESOPHAGEAL MANOMETRY N/A 01/10/2018   Procedure: ESOPHAGEAL MANOMETRY (EM);  Surgeon: Mauri Pole, MD;  Location: [facility];  Service: Endoscopy;  Laterality: N/A;  . ESOPHAGOGASTRODUODENOSCOPY  03/2012   SLF: gastritis  . ESOPHAGOGASTRODUODENOSCOPY N/A 12/09/2016   Procedure: ESOPHAGOGASTRODUODENOSCOPY (EGD);  Surgeon: Danie Binder, MD;  [facility];  Service: Endoscopy;  Laterality: N/A;  11:30am  . ESOPHAGOGASTRODUODENOSCOPY N/A 08/22/2017   Procedure: ESOPHAGOGASTRODUODENOSCOPY (EGD);  Surgeon: Danie Binder, MD;  [facility];  Service: Endoscopy;  Laterality: N/A;  11:30am  . PARTIAL HYSTERECTOMY    . [place] IMPEDANCE STUDY N/A 01/10/2018   Procedure: [place] IMPEDANCE STUDY;  Surgeon: Mauri Pole, MD;  Location: [facility];  Service: Endoscopy;  Laterality: N/A;  . SAVORY DILATION N/A 08/22/2017   Procedure: SAVORY DILATION;  Surgeon: Danie Binder, MD;  [facility];  Service: Endoscopy;  Laterality: N/A;  . TRIGGER FINGER RELEASE Left     SOCIAL HISTORY: Social History   Socioeconomic History  . Marital status: Married    Spouse name: Not on file  . Number of children: 2  . Years of education: Not on  file  . Highest education level: Not on file  Occupational History  . 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 Smoking status: Never Smoker  . Smokeless tobacco: Never Used  Substance and Sexual Activity  . Alcohol use: No    Alcohol/week: 0.0 standard drinks  . Drug use: No  . Sexual  activity: Not on file  Other Topics Concern  . Not on file  Social History Narrative   Lives w/ husband 2 kids-17/20 healthy. STILL WORKING AT THE [employer].   Social Determinants of Health   Financial Resource Strain:   . Difficulty of Paying Living Expenses: Not on file  Food Insecurity:   . Worried About Charity fundraiser in the Last Year: Not on file  . Ran Out of Food in the Last Year: Not on file  Transportation Needs:   . Lack of Transportation (Medical): Not on file  . Lack of Transportation (Non-Medical): Not on file  Physical Activity:   . Days of Exercise per Week: Not on file  . Minutes of Exercise per Session: Not on file  Stress:   . Feeling of Stress : Not on file  Social Connections:   . Frequency of Communication with Friends and Family: Not on file  . Frequency of Social Gatherings with Friends and Family: Not on file  . Attends Religious Services: Not on file  . Active Member of Clubs or Organizations: Not on file  . Attends Archivist Meetings: Not on file  . Marital Status: Not on file  Intimate Partner Violence:   . Fear of Current or Ex-Partner: Not on file  . Emotionally Abused: Not on file  . Physically Abused: Not on file  . Sexually Abused: Not on file    FAMILY HISTORY: Family History  Problem Relation Age of Onset  . Stroke Mother   . Breast cancer Mother   . Diabetes Brother   . Stroke Brother   . Lupus Sister   . Diabetes Father   . Healthy Son   . Healthy Son     ALLERGIES:  is allergic to other; iodine; and neosporin [neomycin-bacitracin zn-polymyx].  MEDICATIONS:  Current Outpatient Medications  Medication Sig Dispense Refill  . amitriptyline (ELAVIL) 50 MG tablet TAKE 1 TABLET BY MOUTH AT BEDTIME 30 tablet 5  . ascorbic acid (VITAMIN C) 500 MG tablet Take 500 mg by mouth daily.    . carvedilol (COREG) 6.25 MG tablet Take 1 tablet by mouth every 12 (twelve) hours.    .  cholecalciferol (VITAMIN D3) 25 MCG (1000 UT) tablet Take 2 tablets (2,000 Units total) by mouth daily. 30 tablet 11  . ferrous sulfate 325 (65 FE) MG tablet Take 325 mg by mouth 3 (three) times daily with meals.    . Insulin Aspart (NOVOLOG [place]) Inject 15 Units into the skin See admin instructions. 15 units at breakfast, lunch and dinner    . Insulin Degludec (TRESIBA) 100 UNIT/ML SOLN Inject 50 Units into the skin at bedtime. Take 50 units at bedtime    . ONE TOUCH ULTRA TEST test strip   11  . Vitamin D, Ergocalciferol, (DRISDOL) 1.25 MG (50000 UT) CAPS capsule Take 1 capsule (50,000 Units total) by mouth every 7 (seven) days. 8 capsule 0  . Vitamin D, Ergocalciferol, (DRISDOL) 50000 units CAPS capsule Take 50,000 Units by mouth every 7 (seven) days. Wednesdays    . acetaminophen (TYLENOL) 500 MG  tablet Take 500 mg by mouth every 6 (six) hours as needed for moderate pain.    . polyethylene glycol (MIRALAX / GLYCOLAX) packet Take 17 g by mouth daily as needed for mild constipation.     . Polysacch Fe Cmp-Fe Heme Poly (BIFERA) 28 MG TABS 1 po bid 60 tablet 11   No current facility-administered medications for this visit.    REVIEW OF SYSTEMS:   Constitutional: Denies fevers, chills or abnormal night sweats Eyes: Denies blurriness of vision, double vision or watery eyes Ears, nose, mouth, throat, and face: Denies mucositis or sore throat Respiratory: Reports shortness of breath on exertion.  Denies any cough or wheezing. Cardiovascular: Denies palpitation, chest discomfort or lower extremity swelling Gastrointestinal:  Denies nausea, heartburn or change in bowel habits Skin: Denies abnormal skin rashes Lymphatics: Denies new lymphadenopathy or easy bruising Neurological:Denies numbness, tingling or new weaknesses Behavioral/Psych: Mood is stable, no new changes  All other systems were reviewed with the patient and are negative.  PHYSICAL EXAMINATION: ECOG PERFORMANCE STATUS: 0 -  Asymptomatic  Vitals:   12/24/18 1307  BP: (!) 143/63  Pulse: (!) 108  Resp: 18  Temp: 97.9 F (36.6 C)  SpO2: 99%   Filed Weights   12/24/18 1307  Weight: 172 lb 14.4 oz (78.4 kg)    GENERAL:alert, no distress and comfortable SKIN: skin color, texture, turgor are normal, no rashes or significant lesions EYES: normal, conjunctiva are pink and non-injected, sclera clear OROPHARYNX:no exudate, no erythema and lips, buccal mucosa, and tongue normal  NECK: supple, thyroid normal size, non-tender, without nodularity LYMPH:  no palpable lymphadenopathy in the cervical, axillary or inguinal LUNGS: clear to auscultation and percussion with normal breathing effort HEART: regular rate & rhythm and no murmurs and no lower extremity edema ABDOMEN:abdomen soft, non-tender and normal bowel sounds Musculoskeletal:no cyanosis of digits and no clubbing  PSYCH: alert & oriented x 3 with fluent speech NEURO: no focal motor/sensory deficits  LABORATORY DATA:  I have reviewed the data as listed  RADIOGRAPHIC STUDIES: I have personally reviewed the radiological images as listed and agreed with the findings in the report.  ASSESSMENT & PLAN:  Normocytic anemia 1.  Normocytic anemia: -Referred by Dr. Oneida Alar for work-up of normocytic anemia. -Recently hospitalized with [place] with urosepsis for 5 days.  CBC on 11/14/2018 shows white count 8.7, hemoglobin 8.5, MCV 85, platelet count 264. -Denies any B symptoms.  Denies any pica.  Denies any bleeding per rectum.  She had a hysterectomy approximately 10 years ago. -Reported 1 episode of melena while she was hospitalized at [facility].  No prior history of blood transfusions. -CT scan of the abdomen in November 2018 showed normal spleen. -Patient was started on iron tablet daily on 12/19/2018.  She reports some constipation which is managed with MiraLAX. -Colonoscopy and EGD on 04/02/2012 showing hyperplastic rectal polyp, chronic active gastritis negative for  H. pylori. -We will check a CBC and rule out nutritional deficiencies by checking ferritin, iron panel, 123456, folic acid, methylmalonic acid and copper levels.  We will check for hemolysis including LDH, reticulocyte count and haptoglobin.  We will also send serum protein electrophoresis. -We will check her stool for occult blood.  We will set her up for follow-up visit in 3 weeks via virtual visit. -In the interim she will continue iron tablet daily.     All questions were answered. The patient knows to call the clinic with any problems, questions or concerns.      Dirk Dress  Delton Coombes, MD 12/24/18 1:57 PM

## 2018-12-24 NOTE — Assessment & Plan Note (Addendum)
1.  Normocytic anemia: -Referred by Dr. Oneida Alar for work-up of normocytic anemia. -Recently hospitalized with [place] with urosepsis for 5 days.  CBC on 11/14/2018 shows white count 8.7, hemoglobin 8.5, MCV 85, platelet count 264. -Denies any B symptoms.  Denies any pica.  Denies any bleeding per rectum.  She had a hysterectomy approximately 10 years ago. -Reported 1 episode of melena while she was hospitalized at [facility].  No prior history of blood transfusions. -CT scan of the abdomen in November 2018 showed normal spleen. -Patient was started on iron tablet daily on 12/19/2018.  She reports some constipation which is managed with MiraLAX. -Colonoscopy and EGD on 04/02/2012 showing hyperplastic rectal polyp, chronic active gastritis negative for H. pylori. -We will check a CBC and rule out nutritional deficiencies by checking ferritin, iron panel, 123456, folic acid, methylmalonic acid and copper levels.  We will check for hemolysis including LDH, reticulocyte count and haptoglobin.  We will also send serum protein electrophoresis. -We will check her stool for occult blood.  We will set her up for follow-up visit in 3 weeks via virtual visit. -In the interim she will continue iron tablet daily.

## 2018-12-24 NOTE — Patient Instructions (Signed)
[place] at [facility] Discharge Instructions  You were seen today by Dr. Delton Coombes. He went over your history, family history and how you've been since your visit at [facility] He will get blood work drawn today. He will see you back in 3 weeks for follow up.   Thank you for choosing [place] at [facility] to provide your oncology and hematology care.  To afford each patient quality time with our provider, please arrive at least 15 minutes before your scheduled appointment time.   If you have a lab appointment with the [facility] please come in thru the  Main Entrance and check in at the main information desk  You need to re-schedule your appointment should you arrive 10 or more minutes late.  We strive to give you quality time with our providers, and arriving late affects you and other patients whose appointments are after yours.  Also, if you no show three or more times for appointments you may be dismissed from the clinic at the providers discretion.     Again, thank you for choosing [facility].  Our hope is that these requests will decrease the amount of time that you wait before being seen by our physicians.       _____________________________________________________________  Should you have questions after your visit to [facility], please contact our office at [phone] between the hours of 8:00 a.m. and 4:30 p.m.  Voicemails left after 4:00 p.m. will not be returned until the following business day.  For prescription refill requests, have your pharmacy contact our office and allow 72 hours.    Cancer Center Support Programs:   > Cancer Support Group  2nd Tuesday of the month 1pm-2pm, Journey Room

## 2018-12-25 ENCOUNTER — Other Ambulatory Visit (HOSPITAL_COMMUNITY): Payer: Self-pay | Admitting: Emergency Medicine

## 2018-12-25 ENCOUNTER — Other Ambulatory Visit (HOSPITAL_COMMUNITY): Payer: Self-pay

## 2018-12-25 DIAGNOSIS — D649 Anemia, unspecified: Secondary | ICD-10-CM | POA: Diagnosis not present

## 2018-12-25 LAB — PROTEIN ELECTROPHORESIS, SERUM
A/G Ratio: 0.7 (ref 0.7–1.7)
Albumin ELP: 2.8 g/dL — ABNORMAL LOW (ref 2.9–4.4)
Alpha-1-Globulin: 0.2 g/dL (ref 0.0–0.4)
Alpha-2-Globulin: 0.8 g/dL (ref 0.4–1.0)
Beta Globulin: 1.2 g/dL (ref 0.7–1.3)
Gamma Globulin: 1.5 g/dL (ref 0.4–1.8)
Globulin, Total: 3.8 g/dL (ref 2.2–3.9)
Total Protein ELP: 6.6 g/dL (ref 6.0–8.5)

## 2018-12-25 LAB — OCCULT BLOOD X 1 CARD TO LAB, STOOL
Fecal Occult Bld: NEGATIVE
Fecal Occult Bld: NEGATIVE
Fecal Occult Bld: NEGATIVE

## 2018-12-25 LAB — HAPTOGLOBIN: Haptoglobin: 180 mg/dL (ref 33–346)

## 2018-12-25 NOTE — Progress Notes (Unsigned)
.  hem

## 2018-12-26 LAB — COPPER, SERUM: Copper: 129 ug/dL (ref 72–166)

## 2018-12-27 LAB — METHYLMALONIC ACID, SERUM: Methylmalonic Acid, Quantitative: 156 nmol/L (ref 0–378)

## 2019-01-09 ENCOUNTER — Telehealth: Payer: Self-pay | Admitting: Gastroenterology

## 2019-01-09 NOTE — Telephone Encounter (Signed)
PT HAS ESTABLISHED CARE WITH ANOTHER GI PRACTICE. CANCEL ALL FUTURE APPTS.

## 2019-01-09 NOTE — Telephone Encounter (Signed)
Noted routing to Dr. Oneida Alar to see if she would like to discharge her from our practice

## 2019-01-09 NOTE — Telephone Encounter (Signed)
Dr Serita Grit office called requesting records on patient that was being seen there today. I told her she was a patient of [facility] and was just seen 12/19/2018. I faxed requested records.

## 2019-01-10 ENCOUNTER — Encounter: Payer: Self-pay | Admitting: General Practice

## 2019-01-10 NOTE — Telephone Encounter (Signed)
Discharge letter mailed  

## 2019-01-14 ENCOUNTER — Inpatient Hospital Stay (HOSPITAL_COMMUNITY): Payer: BC Managed Care – PPO | Attending: Hematology | Admitting: Hematology

## 2019-01-14 ENCOUNTER — Other Ambulatory Visit: Payer: Self-pay

## 2019-02-06 ENCOUNTER — Other Ambulatory Visit: Payer: Self-pay | Admitting: Gastroenterology

## 2019-03-18 ENCOUNTER — Encounter: Payer: Self-pay | Admitting: Gastroenterology

## 2019-06-25 LAB — BASIC METABOLIC PANEL
BUN: 17 (ref 4–21)
Creatinine: 1 (ref 0.5–1.1)

## 2019-06-25 LAB — VITAMIN D 25 HYDROXY (VIT D DEFICIENCY, FRACTURES): Vit D, 25-Hydroxy: 14

## 2019-06-25 LAB — LIPID PANEL
Cholesterol: 289 — AB (ref 0–200)
HDL: 36 (ref 35–70)
LDL Cholesterol: 212
Triglycerides: 205 — AB (ref 40–160)

## 2019-06-25 LAB — HEMOGLOBIN A1C: Hemoglobin A1C: 12.4

## 2019-06-25 LAB — TSH: TSH: 2.34 (ref 0.41–5.90)

## 2019-06-25 LAB — COMPREHENSIVE METABOLIC PANEL
GFR calc Af Amer: >60
GFR calc non Af Amer: >60

## 2019-07-17 DIAGNOSIS — M65332 Trigger finger, left middle finger: Secondary | ICD-10-CM | POA: Insufficient documentation

## 2019-07-31 DIAGNOSIS — Z9889 Other specified postprocedural states: Secondary | ICD-10-CM | POA: Insufficient documentation

## 2019-08-20 ENCOUNTER — Other Ambulatory Visit: Payer: Self-pay

## 2019-08-20 ENCOUNTER — Encounter: Payer: Self-pay | Admitting: "Endocrinology

## 2019-08-20 ENCOUNTER — Ambulatory Visit (INDEPENDENT_AMBULATORY_CARE_PROVIDER_SITE_OTHER): Payer: BLUE CROSS/BLUE SHIELD | Admitting: "Endocrinology

## 2019-08-20 VITALS — BP 136/84 | HR 92 | Ht 66.0 in | Wt 173.0 lb

## 2019-08-20 DIAGNOSIS — E1165 Type 2 diabetes mellitus with hyperglycemia: Secondary | ICD-10-CM | POA: Diagnosis not present

## 2019-08-20 MED ORDER — ONETOUCH VERIO W/DEVICE KIT
1.0000 | PACK | 0 refills | Status: AC | PRN
Start: 2019-08-20 — End: ?

## 2019-08-20 MED ORDER — ROSUVASTATIN CALCIUM 20 MG PO TABS
20.0000 mg | ORAL_TABLET | Freq: Every day | ORAL | 1 refills | Status: DC
Start: 2019-08-20 — End: 2022-11-03

## 2019-08-20 MED ORDER — ONETOUCH VERIO VI STRP: ORAL_STRIP | 12 refills | Status: AC

## 2019-08-20 NOTE — Patient Instructions (Signed)

## 2019-08-20 NOTE — Progress Notes (Signed)
Endocrinology Consult Note       08/20/2019, 3:57 PM   Subjective:    Patient ID: Brianna [name], female    DOB: 07/29/1960.  Brianna [name] is being seen in consultation for management of currently uncontrolled symptomatic diabetes requested by  Bretta Bang, MD.   Past Medical History:  Diagnosis Date  . Diabetes mellitus ([place])   . Gastritis   . GERD without esophagitis     Past Surgical History:  Procedure Laterality Date  . [place] STUDY  01/10/2018   Procedure: [place] STUDY;  Surgeon: Mauri Pole, MD;  Location: [facility];  Service: Endoscopy;;  . CARPAL TUNNEL RELEASE     left  . COLONOSCOPY WITH ESOPHAGOGASTRODUODENOSCOPY (EGD) N/A 04/02/2012   SLF: SMALL RECTAL polyp/Small internal hemorrhoids. Polyp hyperplastic. On EGD she had mild gastritis. Biopsy showed chronic and active gastritis and no H. pylori. Next colonoscopy in 10 years with overtube/? Propofol  . ESOPHAGEAL DILATION  12/09/2016   Procedure: ESOPHAGEAL DILATION;  Surgeon: Danie Binder, MD;  [facility];  Service: Endoscopy;;  . ESOPHAGEAL MANOMETRY N/A 01/10/2018   Procedure: ESOPHAGEAL MANOMETRY (EM);  Surgeon: Mauri Pole, MD;  Location: [facility];  Service: Endoscopy;  Laterality: N/A;  . ESOPHAGOGASTRODUODENOSCOPY  03/2012   SLF: gastritis  . ESOPHAGOGASTRODUODENOSCOPY N/A 12/09/2016   Procedure: ESOPHAGOGASTRODUODENOSCOPY (EGD);  Surgeon: Danie Binder, MD;  [facility];  Service: Endoscopy;  Laterality: N/A;  11:30am  . ESOPHAGOGASTRODUODENOSCOPY N/A 08/22/2017   Procedure: ESOPHAGOGASTRODUODENOSCOPY (EGD);  Surgeon: Danie Binder, MD;  [facility];  Service: Endoscopy;  Laterality: N/A;  11:30am  . PARTIAL HYSTERECTOMY    . [place] IMPEDANCE STUDY N/A 01/10/2018   Procedure: [place] IMPEDANCE STUDY;  Surgeon: Mauri Pole, MD;  Location: [facility];  Service: Endoscopy;  Laterality: N/A;  . S[place]ORY DILATION N/A 08/22/2017   Procedure: [place] DILATION;  Surgeon: Danie Binder, MD;  [facility];  Service: Endoscopy;  Laterality: N/A;  . TRIGGER FINGER RELEASE Left     Social History   Socioeconomic History  . Marital status: Married    Spouse name: Not on file  . Number of children: 2  . Years of education: Not on file  . Highest education level: Not on file  Occupational History  . 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 Smoking status: Never Smoker  . Smokeless tobacco: Never Used  Vaping Use  . Vaping Use: Never used  Substance and Sexual Activity  . Alcohol use: No    Alcohol/week: 0.0 standard drinks  . Drug use: No  . Sexual activity: Not on file  Other Topics Concern  . Not on file  Social History Narrative   Lives w/ husband 2 kids-17/20 healthy. STILL WORKING AT THE [employer].   Social Determinants of Health   Financial Resource Strain:   . Difficulty of Paying Living Expenses:   Food Insecurity:   . Worried About Charity fundraiser in the Last Year:   . [employer]  in the Last Year:   Transportation Needs:   . Film/video editor (Medical):   Marland Kitchen Lack of Transportation (Non-Medical):   Physical Activity:   . Days of Exercise per Week:   . Minutes of Exercise per Session:   Stress:   . Feeling of Stress :   Social Connections:   . Frequency of Communication with Friends and Family:   . Frequency of Social Gatherings with Friends and Family:   . Attends Religious Services:   . Active Member of Clubs or Organizations:   . Attends Archivist Meetings:   Marland Kitchen Marital Status:     Family History  Problem Relation Age of Onset  . Stroke Mother   . Breast cancer Mother   . Hypertension Mother   . Diabetes Brother   . Stroke Brother   . Lupus Sister   . Diabetes Father   . Healthy Son   .  Healthy Son     Outpatient Encounter Medications as of 08/20/2019  Medication Sig  . albuterol (VENTOLIN HFA) 108 (90 Base) MCG/ACT inhaler INHALE 1 PUFF BY MOUTH EVERY 4 HOURS AS NEEDED  . acetaminophen (TYLENOL) 500 MG tablet Take 500 mg by mouth every 6 (six) hours as needed for moderate pain.  Marland Kitchen amitriptyline (ELAVIL) 50 MG tablet TAKE 1 TABLET BY MOUTH AT BEDTIME  . ascorbic acid (VITAMIN C) 500 MG tablet Take 500 mg by mouth daily.  . Blood Glucose Monitoring Suppl (ONETOUCH VERIO) w/Device KIT 1 each by Does not apply route as needed.  . carvedilol (COREG) 6.25 MG tablet Take 1 tablet by mouth every 12 (twelve) hours.  . cholecalciferol (VITAMIN D3) 25 MCG (1000 UT) tablet Take 2 tablets (2,000 Units total) by mouth daily.  . ferrous sulfate 325 (65 FE) MG tablet Take 325 mg by mouth 3 (three) times daily with meals.  Marland Kitchen glucose blood (ONETOUCH VERIO) test strip Use as instructed  . Insulin Aspart (NOVOLOG [place]) Inject 10-16 Units into the skin See admin instructions.  . Insulin Degludec (TRESIBA) 100 UNIT/ML SOLN Inject 40 Units into the skin at bedtime. Take 50 units at bedtime  . ONE TOUCH ULTRA TEST test strip   . polyethylene glycol (MIRALAX / GLYCOLAX) packet Take 17 g by mouth daily as needed for mild constipation.   . Polysacch Fe Cmp-Fe Heme Poly (BIFERA) 28 MG TABS 1 po bid  . rosuvastatin (CRESTOR) 20 MG tablet Take 1 tablet (20 mg total) by mouth daily.  . Zinc 100 MG TABS Take 1 tablet by mouth daily.  . [DISCONTINUED] clindamycin (CLEOCIN) 150 MG capsule Take 300 mg by mouth every 8 (eight) hours.  . [DISCONTINUED] hydrOXYzine (ATARAX/VISTARIL) 10 MG tablet Take 10 mg by mouth every 8 (eight) hours.  . [DISCONTINUED] triamcinolone cream (KENALOG) 0.1 % Apply topically 2 (two) times a week.  . [DISCONTINUED] Vitamin D, Ergocalciferol, (DRISDOL) 1.25 MG (50000 UT) CAPS capsule Take 1 capsule (50,000 Units total) by mouth every 7 (seven) days.  . [DISCONTINUED] Vitamin D,  Ergocalciferol, (DRISDOL) 50000 units CAPS capsule Take 50,000 Units by mouth every 7 (seven) days. Wednesdays   No facility-administered encounter medications on file as of 08/20/2019.    ALLERGIES: Allergies  Allergen Reactions  . Other Hives and Shortness Of Breath    seafood  . Bacitracin   . Iodine Hives    shellfish  . Tramadol   . Neosporin [Neomycin-Bacitracin Zn-Polymyx] Rash    VACCINATION STATUS:  There is no immunization history on file  for this patient.  Diabetes She presents for her initial diabetic visit. She has type 2 diabetes mellitus. Onset time: She was diagnosed at approximate age of 60 years. Her disease course has been worsening. There are no hypoglycemic associated symptoms. Pertinent negatives for hypoglycemia include no confusion, headaches, pallor or seizures. Associated symptoms include fatigue, polydipsia and polyuria. Pertinent negatives for diabetes include no chest pain and no polyphagia. There are no hypoglycemic complications. Symptoms are worsening. There are no diabetic complications. Risk factors for coronary artery disease include diabetes mellitus, dyslipidemia, sedentary lifestyle, post-menopausal and family history. Current diabetic treatment includes insulin injections (She is currently on Tresiba 50 units nightly, NovoLog 15 units 3 times daily AC.  She reports some hypoglycemia overnight.). Her weight is fluctuating minimally. She is following a generally unhealthy diet. When asked about meal planning, she reported none. She has not had a previous visit with a dietitian. (She did not bring any logs nor meter to review.  Her previsit labs show A1c of 12.4%.) An ACE inhibitor/angiotensin II receptor blocker is not being taken.  Hyperlipidemia This is a chronic problem. The current episode started more than 1 year ago. Exacerbating diseases include diabetes. Pertinent negatives include no chest pain, myalgias or shortness of breath. She is currently on  no antihyperlipidemic treatment. Risk factors for coronary artery disease include diabetes mellitus, dyslipidemia, family history, a sedentary lifestyle and post-menopausal.     Review of Systems  Constitutional: Positive for fatigue. Negative for chills, fever and unexpected weight change.  HENT: Negative for trouble swallowing and voice change.   Eyes: Negative for visual disturbance.  Respiratory: Negative for cough, shortness of breath and wheezing.   Cardiovascular: Negative for chest pain, palpitations and leg swelling.  Gastrointestinal: Negative for diarrhea, nausea and vomiting.  Endocrine: Positive for polydipsia and polyuria. Negative for cold intolerance, heat intolerance and polyphagia.  Musculoskeletal: Negative for arthralgias and myalgias.  Skin: Negative for color change, pallor, rash and wound.  Neurological: Negative for seizures and headaches.  Psychiatric/Behavioral: Negative for confusion and suicidal ideas.    Objective:    Vitals with BMI 08/20/2019 12/24/2018 12/19/2018  Height 5' 6"  5' 6"  5' 8"   Weight 173 lbs 172 lbs 14 oz 175 lbs 6 oz  BMI 27.94 14.43 15.40  Systolic 086 761 950  Diastolic 84 63 97  Pulse 92 108 113    BP 136/84   Pulse 92   Ht 5' 6"  (1.676 m)   Wt 173 lb (78.5 kg)   BMI 27.92 kg/m   Wt Readings from Last 3 Encounters:  08/20/19 173 lb (78.5 kg)  12/24/18 172 lb 14.4 oz (78.4 kg)  12/19/18 175 lb 6.4 oz (79.6 kg)     Physical Exam Constitutional:      Appearance: She is well-developed.  HENT:     Head: Normocephalic and atraumatic.  Neck:     Thyroid: No thyromegaly.     Trachea: No tracheal deviation.  Cardiovascular:     Rate and Rhythm: Normal rate and regular rhythm.  Pulmonary:     Effort: Pulmonary effort is normal.  Abdominal:     Tenderness: There is no abdominal tenderness. There is no guarding.  Musculoskeletal:        General: Normal range of motion.     Cervical back: Normal range of motion and neck  supple.  Skin:    General: Skin is warm and dry.     Coloration: Skin is not pale.     Findings:  No erythema or rash.  Neurological:     Mental Status: She is alert and oriented to person, place, and time.     Cranial Nerves: No cranial nerve deficit.     Coordination: Coordination normal.     Deep Tendon Reflexes: Reflexes are normal and symmetric.  Psychiatric:        Judgment: Judgment normal.     CMP ( most recent) CMP     Component Value Date/Time   NA 137 02/01/2018 1038   NA 141 02/27/2012 0000   K 4.4 02/01/2018 1038   K 4.1 02/27/2012 0000   CL 100 02/01/2018 1038   CL 26 02/27/2012 0000   CO2 30 02/01/2018 1038   GLUCOSE 371 (H) 02/01/2018 1038   BUN 17 06/25/2019 0000   CREATININE 1.0 06/25/2019 0000   CREATININE 0.76 02/01/2018 1038   CALCIUM 9.6 02/01/2018 1038   CALCIUM 9.4 02/27/2012 0000   PROT 6.7 02/01/2018 1038   PROT 6.7 02/27/2012 0000   ALBUMIN 3.6 11/14/2016 1929   ALBUMIN 4.1 02/27/2012 0000   AST 15 02/01/2018 1038   AST 11 02/27/2012 0000   ALT 16 02/01/2018 1038   ALKPHOS 115 11/14/2016 1929   ALKPHOS 103 02/27/2012 0000   BILITOT 0.3 02/01/2018 1038   BILITOT 0.2 02/27/2012 0000   GFRNONAA >60 06/25/2019 0000   GFRNONAA 88 02/01/2018 1038   GFRAA >60 06/25/2019 0000   GFRAA 102 02/01/2018 1038     Diabetic Labs (most recent): Lab Results  Component Value Date   HGBA1C 12.4 06/25/2019   HGBA1C 10.0 (A) 02/27/2012     Lipid Panel ( most recent) Lipid Panel     Component Value Date/Time   CHOL 289 (A) 06/25/2019 0000   TRIG 205 (A) 06/25/2019 0000   HDL 36 06/25/2019 0000   LDLCALC 212 06/25/2019 0000      Lab Results  Component Value Date   TSH 2.34 06/25/2019   TSH 1.36 02/01/2018   TSH 1.671 04/02/2014   TSH 1.278 08/27/2012   FREET4 1.03 04/02/2014       Assessment & Plan:   1. Uncontrolled type 2 diabetes mellitus with hyperglycemia ([place])  - Kare Dado has currently uncontrolled symptomatic  type 2 DM since  59 years of age. She did not bring any logs nor meter to review.  Her previsit labs show A1c of 12.4%.  Recent labs reviewed. - I had a long discussion with her about the progressive nature of diabetes and the pathology behind its complications. -She does not report gross complications from diabetes, however she remains at a high risk for more acute and chronic complications which include CAD, CVA, CKD, retinopathy, and neuropathy. These are all discussed in detail with her.  - I have counseled her on diet  and weight management  by adopting a carbohydrate restricted/protein rich diet. Patient is encouraged to switch to  unprocessed or minimally processed     complex starch and increased protein intake (animal or plant source), fruits, and vegetables. -  she is advised to stick to a routine mealtimes to eat 3 meals  a day and avoid unnecessary snacks ( to snack only to correct hypoglycemia).   - she admits that there is a room for improvement in her food and drink choices. - Suggestion is made for her to avoid simple carbohydrates  from her diet including Cakes, Sweet Desserts, Ice Cream, Soda (diet and regular), Sweet Tea, Candies, Chips, Cookies, Store Bought Juices, Alcohol in Excess  of  1-2 drinks a day, Artificial Sweeteners,  Coffee Creamer, and "Sugar-free" Products. This will help patient to have more stable blood glucose profile and potentially avoid unintended weight gain.  - she will be scheduled with Jearld Fenton, RDN, CDE for diabetes education.  - I have approached her with the following individualized plan to manage  her diabetes and patient agrees:   -In light of her prevailing glycemic burden, she will continue to need intensive treatment with basal/bolus insulin in order for her to achieve and maintain control of diabetes to target.  -Accordingly, she is advised to adjust her Tresiba 40 units nightly, adjust her NovoLog to 10 units 3 times a day with meals  for  pre-meal BG readings of 90-164m/dl, plus patient specific correction dose for unexpected hyperglycemia above 1540mdl, associated with strict monitoring of glucose 4 times a day-before meals and at bedtime. -This patient would benefit from a CGM device.  She will  be considered for the freestyle libre device during her next visit. -One Touch meter and testing supplies were prescribed for her. - she is warned not to take insulin without proper monitoring per orders. - Adjustment parameters are given to her for hypo and hyperglycemia in writing. - she is encouraged to call clinic for blood glucose levels less than 70 or above 300 mg /dl. -She will need work-up to classify her diabetes properly.  She will be considered for other non-insulin options on subsequent visits accordingly.  - Specific targets for  A1c;  LDL, HDL,  and Triglycerides were discussed with the patient.  2) Blood Pressure /Hypertension:  her blood pressure is  controlled to target.  She is not on any antihypertensive medications.   3) Lipids/Hyperlipidemia:   Review of her recent lipid panel showed uncontrolled  LDL at 212 .  She is not on any statins.  I discussed and initiated Crestor 20 mg p.o. nightly. -Side effects and precautions discussed with her.  4)  Weight/Diet:  Body mass index is 27.92 kg/m.  -   she is  a candidate for weight loss. I discussed with her the fact that loss of 5 - 10% of her  current body weight will have the most impact on her diabetes management.  Exercise, and detailed carbohydrates information provided  -  detailed on discharge instructions.  5) Chronic Care/Health Maintenance:  -she  is on Statin medications and  is encouraged to initiate and continue to follow up with Ophthalmology, Dentist,  Podiatrist at least yearly or according to recommendations, and advised to   stay away from smoking. I have recommended yearly flu vaccine and pneumonia vaccine at least every 5 years; moderate intensity  exercise for up to 150 minutes weekly; and  sleep for at least 7 hours a day.  - she is  advised to maintain close follow up with TrBretta BangMD for primary care needs, as well as her other providers for optimal and coordinated care.   - Time spent in this patient care: 60 min, of which > 50% was spent in  counseling  her about her chronically uncontrolled diabetes, hyperlipidemia and the rest reviewing her blood glucose logs , discussing her hypoglycemia and hyperglycemia episodes, reviewing her current and  previous labs / studies  ( including abstraction from other facilities) and medications  doses and developing a  long term treatment plan based on the latest standards of care/ guidelines; and documenting her care.    Please refer to Patient Instructions for Blood  Glucose Monitoring and Insulin/Medications Dosing Guide"  in media tab for additional information. Please  also refer to " Patient Self Inventory" in the Media  tab for reviewed elements of pertinent patient history.  Brianna [name] participated in the discussions, expressed understanding, and voiced agreement with the above plans.  All questions were answered to her satisfaction. she is encouraged to contact clinic should she have any questions or concerns prior to her return visit.   Follow up plan: - Return in about 1 week (around 08/27/2019) for F/U with Meter and Logs Only - no Labs.  Glade Lloyd, MD [facility] [address] [place], [place] [address] Phone: [phone]  Fax: [phone]    08/20/2019, 3:57 PM  This note was partially dictated with voice recognition software. Similar sounding words can be transcribed inadequately or may not  be corrected upon review.

## 2019-08-27 ENCOUNTER — Other Ambulatory Visit: Payer: Self-pay

## 2019-08-27 ENCOUNTER — Encounter: Payer: Self-pay | Admitting: "Endocrinology

## 2019-08-27 ENCOUNTER — Ambulatory Visit (INDEPENDENT_AMBULATORY_CARE_PROVIDER_SITE_OTHER): Payer: BLUE CROSS/BLUE SHIELD | Admitting: "Endocrinology

## 2019-08-27 VITALS — BP 120/74 | HR 100 | Ht 66.0 in | Wt 165.8 lb

## 2019-08-27 DIAGNOSIS — E1165 Type 2 diabetes mellitus with hyperglycemia: Secondary | ICD-10-CM | POA: Diagnosis not present

## 2019-08-27 MED ORDER — FREESTYLE LIBRE 2 SENSOR MISC
1.0000 | 3 refills | Status: DC
Start: 2019-08-27 — End: 2019-09-10

## 2019-08-27 MED ORDER — FREESTYLE LIBRE 2 READER DEVI
0 refills | Status: DC
Start: 2019-08-27 — End: 2019-09-10

## 2019-08-27 NOTE — Patient Instructions (Signed)

## 2019-08-27 NOTE — Progress Notes (Signed)
08/27/2019, 3:42 PM  Endocrinology follow-up note   Subjective:    Patient ID: Brianna [name], female    DOB: 10/15/1960.  Brigitte Soderberg is being seen in follow-up after she was seen in consultation for management of currently uncontrolled symptomatic diabetes requested by  Bretta Bang, MD.   Past Medical History:  Diagnosis Date  . Diabetes mellitus ([place])   . Gastritis   . GERD without esophagitis     Past Surgical History:  Procedure Laterality Date  . [place] STUDY  01/10/2018   Procedure: [place] STUDY;  Surgeon: Mauri Pole, MD;  Location: [facility];  Service: Endoscopy;;  . CARPAL TUNNEL RELEASE     left  . COLONOSCOPY WITH ESOPHAGOGASTRODUODENOSCOPY (EGD) N/A 04/02/2012   SLF: SMALL RECTAL polyp/Small internal hemorrhoids. Polyp hyperplastic. On EGD she had mild gastritis. Biopsy showed chronic and active gastritis and no H. pylori. Next colonoscopy in 10 years with overtube/? Propofol  . ESOPHAGEAL DILATION  12/09/2016   Procedure: ESOPHAGEAL DILATION;  Surgeon: Danie Binder, MD;  [facility]: [facility];  Service: Endoscopy;;  . ESOPHAGEAL MANOMETRY N/A 01/10/2018   Procedure: ESOPHAGEAL MANOMETRY (EM);  Surgeon: Mauri Pole, MD;  Location: [facility];  Service: Endoscopy;  Laterality: N/A;  . ESOPHAGOGASTRODUODENOSCOPY  03/2012   SLF: gastritis  . ESOPHAGOGASTRODUODENOSCOPY N/A 12/09/2016   Procedure: ESOPHAGOGASTRODUODENOSCOPY (EGD);  Surgeon: Danie Binder, MD;  [facility];  Service: Endoscopy;  Laterality: N/A;  11:30am  . ESOPHAGOGASTRODUODENOSCOPY N/A 08/22/2017   Procedure: ESOPHAGOGASTRODUODENOSCOPY (EGD);  Surgeon: Danie Binder, MD;  [facility];  Service: Endoscopy;  Laterality: N/A;  11:30am  . PARTIAL HYSTERECTOMY    . [place] IMPEDANCE STUDY N/A 01/10/2018   Procedure: [place] IMPEDANCE STUDY;  Surgeon: Mauri Pole, MD;  Location: [facility];  Service: Endoscopy;  Laterality: N/A;  . SAVORY DILATION N/A 08/22/2017   Procedure: SAVORY DILATION;  Surgeon: Danie Binder, MD;  [facility];  Service: Endoscopy;  Laterality: N/A;  . TRIGGER FINGER RELEASE Left     Social History   Socioeconomic History  . Marital status: Married    Spouse name: Not on file  . Number of children: 2  . Years of education: Not on file  . Highest education level: Not on file  Occupational History  . 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 Smoking status: Never Smoker  . Smokeless tobacco: Never Used  Vaping Use  . Vaping Use: Never used  Substance and Sexual Activity  . Alcohol use: No    Alcohol/week: 0.0 standard drinks  . Drug use: No  . Sexual activity: Not on file  Other Topics Concern  . Not on file  Social History Narrative   Lives w/ husband 2 kids-17/20 healthy. STILL WORKING AT THE [employer].   Social Determinants of Health   Financial Resource Strain:   . Difficulty of Paying Living Expenses: Not on file  Food Insecurity:   . Worried About Charity fundraiser in the Last  Year: Not on file  . Ran Out of Food in the Last Year: Not on file  Transportation Needs:   . Lack of Transportation (Medical): Not on file  . Lack of Transportation (Non-Medical): Not on file  Physical Activity:   . Days of Exercise per Week: Not on file  . Minutes of Exercise per Session: Not on file  Stress:   . Feeling of Stress : Not on file  Social Connections:   . Frequency of Communication with Friends and Family: Not on file  . Frequency of Social Gatherings with Friends and Family: Not on file  . Attends Religious Services: Not on file  . Active Member of Clubs or Organizations: Not on file  . Attends Archivist Meetings: Not on file  . Marital Status: Not on file    Family History  Problem Relation Age of  Onset  . Stroke Mother   . Breast cancer Mother   . Hypertension Mother   . Diabetes Brother   . Stroke Brother   . Lupus Sister   . Diabetes Father   . Healthy Son   . Healthy Son     Outpatient Encounter Medications as of 08/27/2019  Medication Sig  . acetaminophen (TYLENOL) 500 MG tablet Take 500 mg by mouth every 6 (six) hours as needed for moderate pain.  Marland Kitchen albuterol (VENTOLIN HFA) 108 (90 Base) MCG/ACT inhaler INHALE 1 PUFF BY MOUTH EVERY 4 HOURS AS NEEDED  . amitriptyline (ELAVIL) 50 MG tablet TAKE 1 TABLET BY MOUTH AT BEDTIME  . ascorbic acid (VITAMIN C) 500 MG tablet Take 500 mg by mouth daily.  . Blood Glucose Monitoring Suppl (ONETOUCH VERIO) w/Device KIT 1 each by Does not apply route as needed.  . carvedilol (COREG) 6.25 MG tablet Take 1 tablet by mouth every 12 (twelve) hours.  . cholecalciferol (VITAMIN D3) 25 MCG (1000 UT) tablet Take 2 tablets (2,000 Units total) by mouth daily.  . Continuous Blood Gluc Receiver (FREESTYLE LIBRE 2 READER) DEVI As directed  . Continuous Blood Gluc Sensor (FREESTYLE LIBRE 2 SENSOR) MISC 1 Piece by Does not apply route every 14 (fourteen) days.  . ferrous sulfate 325 (65 FE) MG tablet Take 325 mg by mouth 3 (three) times daily with meals.  Marland Kitchen glucose blood (ONETOUCH VERIO) test strip Use as instructed  . Insulin Aspart (NOVOLOG [place]) Inject 10-16 Units into the skin See admin instructions.  . Insulin Degludec (TRESIBA) 100 UNIT/ML SOLN Inject 60 Units into the skin at bedtime. Take 50 units at bedtime  . ONE TOUCH ULTRA TEST test strip   . polyethylene glycol (MIRALAX / GLYCOLAX) packet Take 17 g by mouth daily as needed for mild constipation.   . Polysacch Fe Cmp-Fe Heme Poly (BIFERA) 28 MG TABS 1 po bid  . rosuvastatin (CRESTOR) 20 MG tablet Take 1 tablet (20 mg total) by mouth daily.  . Zinc 100 MG TABS Take 1 tablet by mouth daily.   No facility-administered encounter medications on file as of 08/27/2019.    ALLERGIES: Allergies   Allergen Reactions  . Other Hives and Shortness Of Breath    seafood  . Bacitracin   . Iodine Hives    shellfish  . Tramadol   . Neosporin [Neomycin-Bacitracin Zn-Polymyx] Rash    VACCINATION STATUS:  There is no immunization history on file for this patient.  Diabetes She presents for her follow-up diabetic visit. She has type 2 diabetes mellitus. Onset time: She was diagnosed at approximate age  of 42 years. Her disease course has been worsening (She may take significant NovoLog dosing errors.). There are no hypoglycemic associated symptoms. Pertinent negatives for hypoglycemia include no confusion, headaches, pallor or seizures. Associated symptoms include fatigue, polydipsia and polyuria. Pertinent negatives for diabetes include no chest pain and no polyphagia. There are no hypoglycemic complications. Symptoms are worsening. There are no diabetic complications. Risk factors for coronary artery disease include diabetes mellitus, dyslipidemia, sedentary lifestyle, post-menopausal and family history. Current diabetic treatment includes insulin injections (She is currently on Tresiba 50 units nightly, NovoLog 15 units 3 times daily AC.  She reports some hypoglycemia overnight.). Her weight is decreasing steadily. She is following a generally unhealthy diet. When asked about meal planning, she reported none. She has not had a previous visit with a dietitian. Her breakfast blood glucose range is generally >200 mg/dl. Her lunch blood glucose range is generally >200 mg/dl. Her dinner blood glucose range is generally >200 mg/dl. Her bedtime blood glucose range is generally >200 mg/dl. Her overall blood glucose range is >200 mg/dl. (She did not monitor blood glucose adequately adding that she was on board.  Her available readings are averaging 300-400 mg per DL.   Her previsit labs show A1c of 12.4%.) An ACE inhibitor/angiotensin II receptor blocker is not being taken.  Hyperlipidemia This is a chronic  problem. The current episode started more than 1 year ago. Exacerbating diseases include diabetes. Pertinent negatives include no chest pain, myalgias or shortness of breath. She is currently on no antihyperlipidemic treatment. Risk factors for coronary artery disease include diabetes mellitus, dyslipidemia, family history, a sedentary lifestyle and post-menopausal.     Review of Systems  Constitutional: Positive for fatigue. Negative for chills, fever and unexpected weight change.  HENT: Negative for trouble swallowing and voice change.   Eyes: Negative for visual disturbance.  Respiratory: Negative for cough, shortness of breath and wheezing.   Cardiovascular: Negative for chest pain, palpitations and leg swelling.  Gastrointestinal: Negative for diarrhea, nausea and vomiting.  Endocrine: Positive for polydipsia and polyuria. Negative for cold intolerance, heat intolerance and polyphagia.  Musculoskeletal: Negative for arthralgias and myalgias.  Skin: Negative for color change, pallor, rash and wound.  Neurological: Negative for seizures and headaches.  Psychiatric/Behavioral: Negative for confusion and suicidal ideas.    Objective:    Vitals with BMI 08/27/2019 08/20/2019 12/24/2018  Height 5' 6" 5' 6" 5' 6"  Weight 165 lbs 13 oz 173 lbs 172 lbs 14 oz  BMI 26.77 08.67 61.95  Systolic 093 267 124  Diastolic 74 84 63  Pulse 580 92 108    BP 120/74   Pulse 100   Ht 5' 6" (1.676 m)   Wt 165 lb 12.8 oz (75.2 kg)   BMI 26.76 kg/m   Wt Readings from Last 3 Encounters:  08/27/19 165 lb 12.8 oz (75.2 kg)  08/20/19 173 lb (78.5 kg)  12/24/18 172 lb 14.4 oz (78.4 kg)     Physical Exam Constitutional:      Appearance: She is well-developed.  HENT:     Head: Normocephalic and atraumatic.  Neck:     Thyroid: No thyromegaly.     Trachea: No tracheal deviation.  Cardiovascular:     Rate and Rhythm: Normal rate and regular rhythm.  Pulmonary:     Effort: Pulmonary effort is  normal.  Abdominal:     Tenderness: There is no abdominal tenderness. There is no guarding.  Musculoskeletal:        General: Normal  range of motion.     Cervical back: Normal range of motion and neck supple.  Skin:    General: Skin is warm and dry.     Coloration: Skin is not pale.     Findings: No erythema or rash.  Neurological:     Mental Status: She is alert and oriented to person, place, and time.     Cranial Nerves: No cranial nerve deficit.     Coordination: Coordination normal.     Deep Tendon Reflexes: Reflexes are normal and symmetric.  Psychiatric:        Judgment: Judgment normal.     CMP ( most recent) CMP     Component Value Date/Time   NA 137 02/01/2018 1038   NA 141 02/27/2012 0000   K 4.4 02/01/2018 1038   K 4.1 02/27/2012 0000   CL 100 02/01/2018 1038   CL 26 02/27/2012 0000   CO2 30 02/01/2018 1038   GLUCOSE 371 (H) 02/01/2018 1038   BUN 17 06/25/2019 0000   CREATININE 1.0 06/25/2019 0000   CREATININE 0.76 02/01/2018 1038   CALCIUM 9.6 02/01/2018 1038   CALCIUM 9.4 02/27/2012 0000   PROT 6.7 02/01/2018 1038   PROT 6.7 02/27/2012 0000   ALBUMIN 3.6 11/14/2016 1929   ALBUMIN 4.1 02/27/2012 0000   AST 15 02/01/2018 1038   AST 11 02/27/2012 0000   ALT 16 02/01/2018 1038   ALKPHOS 115 11/14/2016 1929   ALKPHOS 103 02/27/2012 0000   BILITOT 0.3 02/01/2018 1038   BILITOT 0.2 02/27/2012 0000   GFRNONAA >60 06/25/2019 0000   GFRNONAA 88 02/01/2018 1038   GFRAA >60 06/25/2019 0000   GFRAA 102 02/01/2018 1038     Diabetic Labs (most recent): Lab Results  Component Value Date   HGBA1C 12.4 06/25/2019   HGBA1C 10.0 (A) 02/27/2012     Lipid Panel ( most recent) Lipid Panel     Component Value Date/Time   CHOL 289 (A) 06/25/2019 0000   TRIG 205 (A) 06/25/2019 0000   HDL 36 06/25/2019 0000   LDLCALC 212 06/25/2019 0000      Lab Results  Component Value Date   TSH 2.34 06/25/2019   TSH 1.36 02/01/2018   TSH 1.671 04/02/2014   TSH 1.278  08/27/2012   FREET4 1.03 04/02/2014       Assessment & Plan:   1. Uncontrolled type 2 diabetes mellitus with hyperglycemia ([place])  - Monna Crean has currently uncontrolled symptomatic type 2 DM since  59 years of age. She did not monitor blood glucose adequately adding that she was on board.  Her available readings are averaging 300-400 mg per DL.   Her previsit labs show A1c of 12.4%.  Recent labs reviewed. - I had a long discussion with her about the progressive nature of diabetes and the pathology behind its complications. -She does not report gross complications from diabetes, however she remains at a high risk for more acute and chronic complications which include CAD, CVA, CKD, retinopathy, and neuropathy. These are all discussed in detail with her.  - I have counseled her on diet  and weight management  by adopting a carbohydrate restricted/protein rich diet. Patient is encouraged to switch to  unprocessed or minimally processed     complex starch and increased protein intake (animal or plant source), fruits, and vegetables. -  she is advised to stick to a routine mealtimes to eat 3 meals  a day and avoid unnecessary snacks ( to snack only to  correct hypoglycemia).   - she  admits there is a room for improvement in her diet and drink choices. -  Suggestion is made for her to avoid simple carbohydrates  from her diet including Cakes, Sweet Desserts / Pastries, Ice Cream, Soda (diet and regular), Sweet Tea, Candies, Chips, Cookies, Sweet Pastries,  Store Bought Juices, Alcohol in Excess of  1-2 drinks a day, Artificial Sweeteners, Coffee Creamer, and "Sugar-free" Products. This will help patient to have stable blood glucose profile and potentially avoid unintended weight gain.  - she will be scheduled with Jearld Fenton, RDN, CDE for diabetes education.  - I have approached her with the following individualized plan to manage  her diabetes and patient agrees:   -In light of  her prevailing glycemic burden, she will continue to need intensive treatment with basal/bolus insulin in order for her to achieve and maintain control of diabetes to target.  -Accordingly, she is advised to increase Tresiba to 60 units nightly, advised to continue NovoLog 10 units 3 times a day with meals  for pre-meal BG readings of 90-157m/dl, plus patient specific correction dose for unexpected hyperglycemia above 1560mdl, associated with strict monitoring of glucose 4 times a day-before meals and at bedtime. -This patient will benefit from a CGM device.  I discussed and prescribed the freestyle libre device for her.    - she is warned not to take insulin without proper monitoring per orders. - Adjustment parameters are given to her for hypo and hyperglycemia in writing. - she is encouraged to call clinic for blood glucose levels less than 70 or above 300 mg /dl. -She will need work-up to classify her diabetes properly.  She will be considered for other non-insulin options on subsequent visits accordingly.  - Specific targets for  A1c;  LDL, HDL,  and Triglycerides were discussed with the patient.  2) Blood Pressure /Hypertension:  Her blood pressure is controlled to target.  She is not on any antihypertensive medications.   3) Lipids/Hyperlipidemia:   Review of her recent lipid panel showed uncontrolled  LDL at 212 .  Initiated on Crestor 20 mg p.o. nightly, tolerates very well.  She is advised to continue.   -Side effects and precautions discussed with her.  4)  Weight/Diet:  Body mass index is 26.76 kg/m.  -   she is not a candidate for major weight loss. I discussed with her the fact that loss of 5 - 10% of her  current body weight will have the most impact on her diabetes management.  Exercise, and detailed carbohydrates information provided  -  detailed on discharge instructions.  5) Chronic Care/Health Maintenance:  -she  is on Statin medications and  is encouraged to initiate and  continue to follow up with Ophthalmology, Dentist,  Podiatrist at least yearly or according to recommendations, and advised to   stay away from smoking. I have recommended yearly flu vaccine and pneumonia vaccine at least every 5 years; moderate intensity exercise for up to 150 minutes weekly; and  sleep for at least 7 hours a day.  - she is  advised to maintain close follow up with TrBretta BangMD for primary care needs, as well as her other providers for optimal and coordinated care.  - Time spent on this patient care encounter:  45 min, of which > 50% was spent in  counseling and the rest reviewing her blood glucose logs , discussing her hypoglycemia and hyperglycemia episodes, reviewing her current and  previous labs /  studies  ( including abstraction from other facilities) and medications  doses and developing a  long term treatment plan and documenting her care.   Please refer to Patient Instructions for Blood Glucose Monitoring and Insulin/Medications Dosing Guide"  in media tab for additional information. Please  also refer to " Patient Self Inventory" in the Media  tab for reviewed elements of pertinent patient history.  Adella Hare participated in the discussions, expressed understanding, and voiced agreement with the above plans.  All questions were answered to her satisfaction. she is encouraged to contact clinic should she have any questions or concerns prior to her return visit.   Follow up plan: - Return in about 2 weeks (around 09/10/2019) for F/U with Meter and Logs Only - no Labs.  Glade Lloyd, MD [facility] [address] [place], [place] [address] Phone: [phone]  Fax: [phone]    08/27/2019, 3:42 PM  This note was partially dictated with voice recognition software. Similar sounding words can be transcribed inadequately or may not  be corrected upon review.

## 2019-09-10 ENCOUNTER — Other Ambulatory Visit: Payer: Self-pay

## 2019-09-10 ENCOUNTER — Encounter: Payer: Self-pay | Admitting: "Endocrinology

## 2019-09-10 ENCOUNTER — Ambulatory Visit (INDEPENDENT_AMBULATORY_CARE_PROVIDER_SITE_OTHER): Payer: Self-pay | Admitting: "Endocrinology

## 2019-09-10 VITALS — BP 142/82 | HR 92 | Ht 66.0 in | Wt 173.4 lb

## 2019-09-10 DIAGNOSIS — E1165 Type 2 diabetes mellitus with hyperglycemia: Secondary | ICD-10-CM

## 2019-09-10 DIAGNOSIS — E119 Type 2 diabetes mellitus without complications: Secondary | ICD-10-CM | POA: Insufficient documentation

## 2019-09-10 DIAGNOSIS — I10 Essential (primary) hypertension: Secondary | ICD-10-CM | POA: Insufficient documentation

## 2019-09-10 DIAGNOSIS — E782 Mixed hyperlipidemia: Secondary | ICD-10-CM

## 2019-09-10 NOTE — Progress Notes (Signed)
09/10/2019, 5:43 PM  Endocrinology follow-up note   Subjective:    Patient ID: Brianna [name], female    DOB: [birth year].  Brianna [name] is being seen in follow-up after she was seen in consultation for management of currently uncontrolled symptomatic diabetes requested by  Bretta Bang, MD.   Past Medical History:  Diagnosis Date  . Diabetes mellitus ([place])   . Gastritis   . GERD without esophagitis     Past Surgical History:  Procedure Laterality Date  . [place] STUDY  01/10/2018   Procedure: [place] STUDY;  Surgeon: Mauri Pole, MD;  Location: [facility];  Service: Endoscopy;;  . CARPAL TUNNEL RELEASE     left  . COLONOSCOPY WITH ESOPHAGOGASTRODUODENOSCOPY (EGD) N/A 04/02/2012   SLF: SMALL RECTAL polyp/Small internal hemorrhoids. Polyp hyperplastic. On EGD she had mild gastritis. Biopsy showed chronic and active gastritis and no H. pylori. Next colonoscopy in 10 years with overtube/? Propofol  . ESOPHAGEAL DILATION  12/09/2016   Procedure: ESOPHAGEAL DILATION;  Surgeon: Danie Binder, MD;  [facility];  Service: Endoscopy;;  . ESOPHAGEAL MANOMETRY N/A 01/10/2018   Procedure: ESOPHAGEAL MANOMETRY (EM);  Surgeon: Mauri Pole, MD;  Location: [facility];  Service: Endoscopy;  Laterality: N/A;  . ESOPHAGOGASTRODUODENOSCOPY  03/2012   SLF: gastritis  . ESOPHAGOGASTRODUODENOSCOPY N/A 12/09/2016   Procedure: ESOPHAGOGASTRODUODENOSCOPY (EGD);  Surgeon: Danie Binder, MD;  [facility];  Service: Endoscopy;  Laterality: N/A;  11:30am  . ESOPHAGOGASTRODUODENOSCOPY N/A 08/22/2017   Procedure: ESOPHAGOGASTRODUODENOSCOPY (EGD);  Surgeon: Danie Binder, MD;  [facility];  Service: Endoscopy;  Laterality: N/A;  11:30am  . PARTIAL HYSTERECTOMY    . [place] IMPEDANCE STUDY N/A 01/10/2018   Procedure: [place] IMPEDANCE STUDY;  Surgeon: Mauri Pole, MD;  Location: [facility];  Service: Endoscopy;  Laterality: N/A;  . S[place]ORY DILATION N/A 08/22/2017   Procedure: SAVORY DILATION;  Surgeon: Danie Binder, MD;  [facility];  Service: Endoscopy;  Laterality: N/A;  . TRIGGER FINGER RELEASE Left     Social History   Socioeconomic History  . Marital status: Married    Spouse name: Not on file  . Number of children: 2  . Years of education: Not on file  . Highest education level: Not on file  Occupational History  . 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 Smoking status: Never Smoker  . Smokeless tobacco: Never Used  Vaping Use  . Vaping Use: Never used  Substance and Sexual Activity  . Alcohol use: No    Alcohol/week: 0.0 standard drinks  . Drug use: No  . Sexual activity: Not on file  Other Topics Concern  . Not on file  Social History Narrative   Lives w/ husband 2 kids-17/20 healthy. STILL WORKING AT THE [employer].   Social Determinants of Health   Financial Resource Strain:   . Difficulty of Paying Living Expenses: Not on file  Food Insecurity:   . Worried About Charity fundraiser in the Last  Year: Not on file  . Ran Out of Food in the Last Year: Not on file  Transportation Needs:   . Lack of Transportation (Medical): Not on file  . Lack of Transportation (Non-Medical): Not on file  Physical Activity:   . Days of Exercise per Week: Not on file  . Minutes of Exercise per Session: Not on file  Stress:   . Feeling of Stress : Not on file  Social Connections:   . Frequency of Communication with Friends and Family: Not on file  . Frequency of Social Gatherings with Friends and Family: Not on file  . Attends Religious Services: Not on file  . Active Member of Clubs or Organizations: Not on file  . Attends Archivist Meetings: Not on file  . Marital Status: Not on file    Family History  Problem Relation Age of  Onset  . Stroke Mother   . Breast cancer Mother   . Hypertension Mother   . Diabetes Brother   . Stroke Brother   . Lupus Sister   . Diabetes Father   . Healthy Son   . Healthy Son     Outpatient Encounter Medications as of 09/10/2019  Medication Sig  . acetaminophen (TYLENOL) 500 MG tablet Take 500 mg by mouth every 6 (six) hours as needed for moderate pain.  Marland Kitchen albuterol (VENTOLIN HFA) 108 (90 Base) MCG/ACT inhaler INHALE 1 PUFF BY MOUTH EVERY 4 HOURS AS NEEDED  . amitriptyline (ELAVIL) 50 MG tablet TAKE 1 TABLET BY MOUTH AT BEDTIME  . ascorbic acid (VITAMIN C) 500 MG tablet Take 500 mg by mouth daily.  . Blood Glucose Monitoring Suppl (ONETOUCH VERIO) w/Device KIT 1 each by Does not apply route as needed.  . carvedilol (COREG) 6.25 MG tablet Take 1 tablet by mouth every 12 (twelve) hours.  . cholecalciferol (VITAMIN D3) 25 MCG (1000 UT) tablet Take 2 tablets (2,000 Units total) by mouth daily.  . ferrous sulfate 325 (65 FE) MG tablet Take 325 mg by mouth 3 (three) times daily with meals.  Marland Kitchen glucose blood (ONETOUCH VERIO) test strip Use as instructed  . Insulin Aspart (NOVOLOG [place]) Inject 10-16 Units into the skin See admin instructions.  . Insulin Degludec (TRESIBA) 100 UNIT/ML SOLN Inject 70 Units into the skin at bedtime. Take 50 units at bedtime  . ONE TOUCH ULTRA TEST test strip   . polyethylene glycol (MIRALAX / GLYCOLAX) packet Take 17 g by mouth daily as needed for mild constipation.   . Polysacch Fe Cmp-Fe Heme Poly (BIFERA) 28 MG TABS 1 po bid  . rosuvastatin (CRESTOR) 20 MG tablet Take 1 tablet (20 mg total) by mouth daily.  . Zinc 100 MG TABS Take 1 tablet by mouth daily.  . [DISCONTINUED] Continuous Blood Gluc Receiver (FREESTYLE LIBRE 2 READER) DEVI As directed  . [DISCONTINUED] Continuous Blood Gluc Sensor (FREESTYLE LIBRE 2 SENSOR) MISC 1 Piece by Does not apply route every 14 (fourteen) days.   No facility-administered encounter medications on file as of 09/10/2019.     ALLERGIES: Allergies  Allergen Reactions  . Other Hives and Shortness Of Breath    seafood  . Bacitracin   . Iodine Hives    shellfish  . Tramadol   . Neosporin [Neomycin-Bacitracin Zn-Polymyx] Rash    VACCINATION STATUS:  There is no immunization history on file for this patient.  Diabetes She presents for her follow-up diabetic visit. She has type 2 diabetes mellitus. Onset time: She was diagnosed at  approximate age of 28 years. Her disease course has been worsening (She may take significant NovoLog dosing errors.). There are no hypoglycemic associated symptoms. Pertinent negatives for hypoglycemia include no confusion, headaches, pallor or seizures. Associated symptoms include fatigue, polydipsia and polyuria. Pertinent negatives for diabetes include no chest pain and no polyphagia. There are no hypoglycemic complications. Symptoms are worsening. There are no diabetic complications. Risk factors for coronary artery disease include diabetes mellitus, dyslipidemia, sedentary lifestyle, post-menopausal and family history. Current diabetic treatment includes insulin injections (She is currently on Tresiba 50 units nightly, NovoLog 15 units 3 times daily AC.  She reports some hypoglycemia overnight.). Her weight is increasing steadily. She is following a generally unhealthy diet. When asked about meal planning, she reported none. She has not had a previous visit with a dietitian. Her breakfast blood glucose range is generally >200 mg/dl. Her lunch blood glucose range is generally >200 mg/dl. Her dinner blood glucose range is generally >200 mg/dl. Her bedtime blood glucose range is generally >200 mg/dl. Her overall blood glucose range is >200 mg/dl. An ACE inhibitor/angiotensin II receptor blocker is not being taken.  Hyperlipidemia This is a chronic problem. The current episode started more than 1 year ago. Exacerbating diseases include diabetes. Pertinent negatives include no chest pain,  myalgias or shortness of breath. She is currently on no antihyperlipidemic treatment. Risk factors for coronary artery disease include diabetes mellitus, dyslipidemia, family history, a sedentary lifestyle and post-menopausal.     Review of Systems  Constitutional: Positive for fatigue. Negative for chills, fever and unexpected weight change.  HENT: Negative for trouble swallowing and voice change.   Eyes: Negative for visual disturbance.  Respiratory: Negative for cough, shortness of breath and wheezing.   Cardiovascular: Negative for chest pain, palpitations and leg swelling.  Gastrointestinal: Negative for diarrhea, nausea and vomiting.  Endocrine: Positive for polydipsia and polyuria. Negative for cold intolerance, heat intolerance and polyphagia.  Musculoskeletal: Negative for arthralgias and myalgias.  Skin: Negative for color change, pallor, rash and wound.  Neurological: Negative for seizures and headaches.  Psychiatric/Behavioral: Negative for confusion and suicidal ideas.    Objective:    Vitals with BMI 09/10/2019 08/27/2019 08/20/2019  Height 5' 6"  5' 6"  5' 6"   Weight 173 lbs 6 oz 165 lbs 13 oz 173 lbs  BMI 28 49.44 96.75  Systolic 916 384 665  Diastolic 82 74 84  Pulse 92 100 92    BP (!) 142/82   Pulse 92   Ht 5' 6"  (1.676 m)   Wt 173 lb 6.4 oz (78.7 kg)   BMI 27.99 kg/m   Wt Readings from Last 3 Encounters:  09/10/19 173 lb 6.4 oz (78.7 kg)  08/27/19 165 lb 12.8 oz (75.2 kg)  08/20/19 173 lb (78.5 kg)     Physical Exam Constitutional:      Appearance: She is well-developed.  HENT:     Head: Normocephalic and atraumatic.  Neck:     Thyroid: No thyromegaly.     Trachea: No tracheal deviation.  Cardiovascular:     Rate and Rhythm: Normal rate and regular rhythm.  Pulmonary:     Effort: Pulmonary effort is normal.  Abdominal:     Tenderness: There is no abdominal tenderness. There is no guarding.  Musculoskeletal:        General: Normal range of motion.      Cervical back: Normal range of motion and neck supple.  Skin:    General: Skin is warm and dry.  Coloration: Skin is not pale.     Findings: No erythema or rash.  Neurological:     Mental Status: She is alert and oriented to person, place, and time.     Cranial Nerves: No cranial nerve deficit.     Coordination: Coordination normal.     Deep Tendon Reflexes: Reflexes are normal and symmetric.  Psychiatric:        Judgment: Judgment normal.     CMP ( most recent) CMP     Component Value Date/Time   NA 137 02/01/2018 1038   NA 141 02/27/2012 0000   K 4.4 02/01/2018 1038   K 4.1 02/27/2012 0000   CL 100 02/01/2018 1038   CL 26 02/27/2012 0000   CO2 30 02/01/2018 1038   GLUCOSE 371 (H) 02/01/2018 1038   BUN 17 06/25/2019 0000   CREATININE 1.0 06/25/2019 0000   CREATININE 0.76 02/01/2018 1038   CALCIUM 9.6 02/01/2018 1038   CALCIUM 9.4 02/27/2012 0000   PROT 6.7 02/01/2018 1038   PROT 6.7 02/27/2012 0000   ALBUMIN 3.6 11/14/2016 1929   ALBUMIN 4.1 02/27/2012 0000   AST 15 02/01/2018 1038   AST 11 02/27/2012 0000   ALT 16 02/01/2018 1038   ALKPHOS 115 11/14/2016 1929   ALKPHOS 103 02/27/2012 0000   BILITOT 0.3 02/01/2018 1038   BILITOT 0.2 02/27/2012 0000   GFRNONAA >60 06/25/2019 0000   GFRNONAA 88 02/01/2018 1038   GFRAA >60 06/25/2019 0000   GFRAA 102 02/01/2018 1038     Diabetic Labs (most recent): Lab Results  Component Value Date   HGBA1C 12.4 06/25/2019   HGBA1C 10.0 (A) 02/27/2012     Lipid Panel ( most recent) Lipid Panel     Component Value Date/Time   CHOL 289 (A) 06/25/2019 0000   TRIG 205 (A) 06/25/2019 0000   HDL 36 06/25/2019 0000   LDLCALC 212 06/25/2019 0000      Lab Results  Component Value Date   TSH 2.34 06/25/2019   TSH 1.36 02/01/2018   TSH 1.671 04/02/2014   TSH 1.278 08/27/2012   FREET4 1.03 04/02/2014       Assessment & Plan:   1. Uncontrolled type 2 diabetes mellitus with hyperglycemia ([place])  - Brianna [name] has currently uncontrolled symptomatic type 2 DM since  59 years of age. She did not afford the co-pays for the CGM device.  She presents with a log in her meter showing persistently above target glycemic profile, slightly better than before.  She has missed at least half of her prandial insulin opportunities.     Her previsit labs show A1c of 12.4%.  Recent labs reviewed. - I had a long discussion with her about the progressive nature of diabetes and the pathology behind its complications. -She does not report gross complications from diabetes, however she remains at a high risk for more acute and chronic complications which include CAD, CVA, CKD, retinopathy, and neuropathy. These are all discussed in detail with her.  - I have counseled her on diet  and weight management  by adopting a carbohydrate restricted/protein rich diet. Patient is encouraged to switch to  unprocessed or minimally processed     complex starch and increased protein intake (animal or plant source), fruits, and vegetables. -  she is advised to stick to a routine mealtimes to eat 3 meals  a day and avoid unnecessary snacks ( to snack only to correct hypoglycemia).    - she  admits there is  a room for improvement in her diet and drink choices. -  Suggestion is made for her to avoid simple carbohydrates  from her diet including Cakes, Sweet Desserts / Pastries, Ice Cream, Soda (diet and regular), Sweet Tea, Candies, Chips, Cookies, Sweet Pastries,  Store Bought Juices, Alcohol in Excess of  1-2 drinks a day, Artificial Sweeteners, Coffee Creamer, and "Sugar-free" Products. This will help patient to have stable blood glucose profile and potentially avoid unintended weight gain.   - she will be scheduled with Brianna [name], RDN, CDE for diabetes education.  - I have approached her with the following individualized plan to manage  her diabetes and patient agrees:   -In light of her prevailing glycemic burden, she'll  continue to need intensive treatment with basal/bolus insulin in order for her to achieve and maintain control of diabetes to target.   -Accordingly, she is advised to [place] to 70 units nightly, advised to continue NovoLog 10 units 3 times a day with meals  for pre-meal BG readings of 80-14m/dl, plus patient specific correction dose for unexpected hyperglycemia above 152mdl, associated with strict monitoring of glucose 4 times a day-before meals and at bedtime. -This patient would have benefited from a CGM device, however unfortunately she did not afford the co-pays.     - she is warned not to take insulin without proper monitoring per orders. - Adjustment parameters are given to her for hypo and hyperglycemia in writing. - she is encouraged to call clinic for blood glucose levels less than 70 or above 300 mg /dl. -She will need work-up to classify her diabetes properly.  She will be considered for other non-insulin options on subsequent visits accordingly.  - Specific targets for  A1c;  LDL, HDL,  and Triglycerides were discussed with the patient.  2) Blood Pressure /Hypertension:  -Her blood pressure is controlled to target.  She is not on any antihypertensive medications.   3) Lipids/Hyperlipidemia:   Review of her recent lipid panel showed uncontrolled  LDL at 212 .  She was recently initiated on Crestor 20 mg p.o. nightly, continues to  Initiated on Crestor 20 mg p.o. nightly, tolerates very well, advised to continue.  Side effects and precautions discussed with her.. Marland Kitchen   4)  Weight/Diet:  Body mass index is 27.99 kg/m.  -   she is not a candidate for major weight loss. I discussed with her the fact that loss of 5 - 10% of her  current body weight will have the most impact on her diabetes management.  Exercise, and detailed carbohydrates information provided  -  detailed on discharge instructions.  5) Chronic Care/Health Maintenance:  -she  is on Statin medications and  is  encouraged to initiate and continue to follow up with Ophthalmology, Dentist,  Podiatrist at least yearly or according to recommendations, and advised to   stay away from smoking. I have recommended yearly flu vaccine and pneumonia vaccine at least every 5 years; moderate intensity exercise for up to 150 minutes weekly; and  sleep for at least 7 hours a day.  - she is  advised to maintain close follow up with TrBretta BangMD for primary care needs, as well as her other providers for optimal and coordinated care.  - Time spent on this patient care encounter:  35 min, of which > 50% was spent in  counseling and the rest reviewing her blood glucose logs , discussing her hypoglycemia and hyperglycemia episodes, reviewing her current and  previous  labs / studies  ( including abstraction from other facilities) and medications  doses and developing a  long term treatment plan and documenting her care.   Please refer to Patient Instructions for Blood Glucose Monitoring and Insulin/Medications Dosing Guide"  in media tab for additional information. Please  also refer to " Patient Self Inventory" in the Media  tab for reviewed elements of pertinent patient history.  Adella Hare participated in the discussions, expressed understanding, and voiced agreement with the above plans.  All questions were answered to her satisfaction. she is encouraged to contact clinic should she have any questions or concerns prior to her return visit.   Follow up plan: - Return in about 5 weeks (around 10/15/2019) for Bring Meter and Logs- A1c in Office.  Glade Lloyd, MD [facility] [address] [place], [place] [address] Phone: [phone]  Fax: [phone]    09/10/2019, 5:42 PM  This note was partially dictated with voice recognition software. Similar sounding words can be transcribed inadequately or may not  be corrected upon review.

## 2019-10-07 ENCOUNTER — Encounter: Payer: BC Managed Care – PPO | Attending: "Endocrinology | Admitting: Nutrition

## 2019-10-07 ENCOUNTER — Other Ambulatory Visit: Payer: Self-pay

## 2019-10-07 VITALS — Ht 66.5 in | Wt 169.0 lb

## 2019-10-07 DIAGNOSIS — I10 Essential (primary) hypertension: Secondary | ICD-10-CM | POA: Insufficient documentation

## 2019-10-07 DIAGNOSIS — E1165 Type 2 diabetes mellitus with hyperglycemia: Secondary | ICD-10-CM | POA: Insufficient documentation

## 2019-10-07 DIAGNOSIS — E782 Mixed hyperlipidemia: Secondary | ICD-10-CM

## 2019-10-07 NOTE — Progress Notes (Signed)
  Medical Nutrition Therapy:  Appt start time: 1500 end time:  1600.   Assessment:  Primary concerns today: Diabetes Type 2. A1C 12.2% . [id number]' sometimes as high 300's.       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   May be late onset Type 1 Dm.   Dx Dm for 15 years.  Seed Dr. Dorris Fetch.  Novolog 10 units with meals plus sliding scale. Tresiba 60 units at night.    Preferred Learning Style:   No preference indicated   Learning Readiness:   Ready  Change in progress   MEDICATIONS:    DIETARY INTAKE:    24-hr recall:  B ( AM): skipped 1230 salsbury steak, potatoes and greens, water Snack: 12 grapes, water  7 pm Dinner Spaghetti, garlic bread  Usual physical activity: ADL   Estimated energy needs: 1500  calories 170 g carbohydrates  112 g protein 42 g fat  Progress Towards Goal(s):  In progress.   Nutritional Diagnosis:  NB-1.1 Food and nutrition-related knowledge deficit As related to Diabetes Type 1 vs 2.  As evidenced by A1C 12.2%.    Intervention:  Nutrition and Diabetes education provided on My Plate, CHO counting, meal planning, portion sizes, timing of meals, avoiding snacks between meals unless having a low blood sugar, target ranges for A1C and blood sugars, signs/symptoms and treatment of hyper/hypoglycemia, monitoring blood sugars, taking medications as prescribed, benefits of exercising 30 minutes per day and prevention of complications of DM.  Goals Follow MY Plate Eat 01-09 g CHO at meals. Cut back on breads, tortillas, and quantity of beans/rice Drink only water  Avoid snacks between meals Exercise 60 minutes 3-4 times per week  Teaching Method Utilized: none Visual Auditory Hands on  Handouts given during visit include:  The Plate Method   Meal Plan Card  Diabetes instructions.   Barriers to learning/adherence to lifestyle change: none  Demonstrated degree of understanding via:  Teach Back   Monitoring/Evaluation:  Dietary  intake, exercise, , and body weight in 1 month(s).

## 2019-10-16 ENCOUNTER — Encounter: Payer: Self-pay | Admitting: Nutrition

## 2019-10-16 NOTE — Patient Instructions (Signed)
Goals Follow MY Plate Eat 83-29 g CHO at meals. Cut back on breads, tortillas, and quantity of beans/rice Drink only water  Avoid snacks between meals Exercise 60 minutes 3-4 times per week

## 2019-10-22 ENCOUNTER — Other Ambulatory Visit: Payer: Self-pay

## 2019-10-22 ENCOUNTER — Encounter: Payer: BC Managed Care – PPO | Attending: "Endocrinology | Admitting: Nutrition

## 2019-10-22 ENCOUNTER — Encounter: Payer: Self-pay | Admitting: "Endocrinology

## 2019-10-22 ENCOUNTER — Ambulatory Visit (INDEPENDENT_AMBULATORY_CARE_PROVIDER_SITE_OTHER): Payer: BLUE CROSS/BLUE SHIELD | Admitting: "Endocrinology

## 2019-10-22 VITALS — BP 136/84 | HR 96 | Ht 66.0 in | Wt 179.4 lb

## 2019-10-22 VITALS — Ht 66.0 in | Wt 179.0 lb

## 2019-10-22 DIAGNOSIS — E1165 Type 2 diabetes mellitus with hyperglycemia: Secondary | ICD-10-CM

## 2019-10-22 DIAGNOSIS — E782 Mixed hyperlipidemia: Secondary | ICD-10-CM | POA: Diagnosis not present

## 2019-10-22 DIAGNOSIS — I10 Essential (primary) hypertension: Secondary | ICD-10-CM

## 2019-10-22 DIAGNOSIS — E559 Vitamin D deficiency, unspecified: Secondary | ICD-10-CM | POA: Diagnosis not present

## 2019-10-22 NOTE — Patient Instructions (Signed)

## 2019-10-22 NOTE — Progress Notes (Signed)
Medical Nutrition Therapy:  Appt start time: 1530  end time:  1600 Assessment:  Primary concerns today: Diabetes Type 2. A1C 10.8%   Saw Dr. Dorris Fetch today. She hasn't been using the sliding scale insulin at meal times properly. Due to busy work schedule, she hasn't been able to take the time at meal times. Struggles to be able to check blood sugar before meals due to work schedule. Taking Crestor, Iron, Tresiba 70 units Novolog 10 units with meals plus sliding scale.   Vit D3 for low vitamin D levels, Vit C, Zinc and Coreg    Had a low blood sugar yesterday at 59 mg/dl at 12 noon. Changes made: Trying to eat more vegetables and better quality foods. Trying not to skip meals but getting lunch in on time is an issue. Not able to exercise yet due to busy schedule.   Diet is inconsistent to meet her needs at this time and help improve her DM. Drinking water.   Insurance doesn't cover the CGM much and the cost out of pocket was too expensive.   CMP Latest Ref Rng & Units 06/25/2019 02/01/2018 11/14/2016  Glucose 65 - 99 mg/dL - 371(H) 224(H)  BUN 4 - 21 17 14 15   Creatinine 0.5 - 1.1 1.0 0.76 0.54  Sodium 135 - 146 mmol/L - 137 137  Potassium 3.5 - 5.3 mmol/L - 4.4 3.2(L)  Chloride 98 - 110 mmol/L - 100 101  CO2 20 - 32 mmol/L - 30 30  Calcium 8.6 - 10.4 mg/dL - 9.6 9.3  Total Protein 6.1 - 8.1 g/dL - 6.7 7.6  Total Bilirubin 0.2 - 1.2 mg/dL - 0.3 0.3  Alkaline Phos 38 - 126 U/L - - 115  AST 10 - 35 U/L - 15 13(L)  ALT 6 - 29 U/L - 16 11(L)   Lab Results  Component Value Date   HGBA1C 12.4 06/25/2019   Lipid Panel     Component Value Date/Time   CHOL 289 (A) 06/25/2019 0000   TRIG 205 (A) 06/25/2019 0000   HDL 36 06/25/2019 0000   LDLCALC 212 06/25/2019 0000    Meter: One Touch Verio  BS readings: 150-200's     Preferred Learning Style:   No preference indicated   Learning Readiness:   Ready  Change in progress   MEDICATIONS:    DIETARY INTAKE:    24-hr  recall:  B ( AM): skipped 1230 salsbury steak, potatoes and greens, water Snack: 12 grapes, water  7 pm Dinner Spaghetti, garlic bread  Usual physical activity: ADL   Estimated energy needs: 1500  calories 170 g carbohydrates  112 g protein 42 g fat  Progress Towards Goal(s):  In progress.   Nutritional Diagnosis:  NB-1.1 Food and nutrition-related knowledge deficit As related to Diabetes Type 1 vs 2.  As evidenced by A1C 12.2%.    Intervention:  Nutrition and Diabetes education provided on My Plate, CHO counting, meal planning, portion sizes, timing of meals, avoiding snacks between meals unless having a low blood sugar, target ranges for A1C and blood sugars, signs/symptoms and treatment of hyper/hypoglycemia, monitoring blood sugars, taking medications as prescribed, benefits of exercising 30 minutes per day and prevention of complications of DM.  Goals  Eat meals on time Test blood sugars before meals to determine how much meal time insulin to take. Try to ask insurance company again about a CGM and which one would be better covered.LIbre or Dexcom Get blood sugars less than 150 before meals  and less than 180 at bedtime. Talk to boss about the need for scheduled meals to eat and take medications. Walk 30 minutes a day when you can.   Teaching Method Utilized: none Visual Auditory Hands on  Handouts given during visit include:  The Plate Method   Meal Plan Card  Diabetes instructions.   Barriers to learning/adherence to lifestyle change: none  Demonstrated degree of understanding via:  Teach Back   Monitoring/Evaluation:  Dietary intake, exercise, , and body weight in 3 month(s). Will try to inquire about CGM again from her insurance.

## 2019-10-22 NOTE — Progress Notes (Signed)
10/22/2019, 5:31 PM  Endocrinology follow-up note   Subjective:    Patient ID: Brianna [name], female    DOB: 1960/10/11.  Brianna [name] is being seen in follow-up after she was seen in consultation for management of currently uncontrolled symptomatic diabetes requested by  Bretta Bang, MD.   Past Medical History:  Diagnosis Date  . Diabetes mellitus ([place])   . Gastritis   . GERD without esophagitis     Past Surgical History:  Procedure Laterality Date  . [place] STUDY  01/10/2018   Procedure: [place] STUDY;  Surgeon: Mauri Pole, MD;  Location: [facility];  Service: Endoscopy;;  . CARPAL TUNNEL RELEASE     left  . COLONOSCOPY WITH ESOPHAGOGASTRODUODENOSCOPY (EGD) N/A 04/02/2012   SLF: SMALL RECTAL polyp/Small internal hemorrhoids. Polyp hyperplastic. On EGD she had mild gastritis. Biopsy showed chronic and active gastritis and no H. pylori. Next colonoscopy in 10 years with overtube/? Propofol  . ESOPHAGEAL DILATION  12/09/2016   Procedure: ESOPHAGEAL DILATION;  Surgeon: Danie Binder, MD;  Location: [facility];  Service: Endoscopy;;  . ESOPHAGEAL MANOMETRY N/A 01/10/2018   Procedure: ESOPHAGEAL MANOMETRY (EM);  Surgeon: Mauri Pole, MD;  Location: [facility];  Service: Endoscopy;  Laterality: N/A;  . ESOPHAGOGASTRODUODENOSCOPY  03/2012   SLF: gastritis  . ESOPHAGOGASTRODUODENOSCOPY N/A 12/09/2016   Procedure: ESOPHAGOGASTRODUODENOSCOPY (EGD);  Surgeon: Danie Binder, MD;  [facility]: [facility];  Service: Endoscopy;  Laterality: N/A;  11:30am  . ESOPHAGOGASTRODUODENOSCOPY N/A 08/22/2017   Procedure: ESOPHAGOGASTRODUODENOSCOPY (EGD);  Surgeon: Danie Binder, MD;  [facility];  Service: Endoscopy;  Laterality: N/A;  11:30am  . PARTIAL HYSTERECTOMY    . [place] IMPEDANCE STUDY N/A 01/10/2018   Procedure: [place] IMPEDANCE STUDY;  Surgeon: Mauri Pole, MD;  Location: [facility];  Service: Endoscopy;  Laterality: N/A;  . S[place] DILATION N/A 08/22/2017   Procedure: SAVORY DILATION;  Surgeon: Danie Binder, MD;  [facility];  Service: Endoscopy;  Laterality: N/A;  . TRIGGER FINGER RELEASE Left     Social History   Socioeconomic History  . Marital status: Married    Spouse name: Not on file  . Number of children: 2  . Years of education: Not on file  . Highest education level: Not on file  Occupational History  . 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 Smoking status: Never Smoker  . Smokeless tobacco: Never Used  Vaping Use  . Vaping Use: Never used  Substance and Sexual Activity  . Alcohol use: No    Alcohol/week: 0.0 standard drinks  . Drug use: No  . Sexual activity: Not on file  Other Topics Concern  . Not on file  Social History Narrative   Lives w/ husband 2 kids-17/20 healthy. STILL WORKING AT THE [employer].   Social Determinants of Health   Financial Resource Strain:   . Difficulty of Paying Living Expenses: Not on file  Food Insecurity:   . Worried About Charity fundraiser in the Last  Year: Not on file  . Ran Out of Food in the Last Year: Not on file  Transportation Needs:   . Lack of Transportation (Medical): Not on file  . Lack of Transportation (Non-Medical): Not on file  Physical Activity:   . Days of Exercise per Week: Not on file  . Minutes of Exercise per Session: Not on file  Stress:   . Feeling of Stress : Not on file  Social Connections:   . Frequency of Communication with Friends and Family: Not on file  . Frequency of Social Gatherings with Friends and Family: Not on file  . Attends Religious Services: Not on file  . Active Member of Clubs or Organizations: Not on file  . Attends Archivist Meetings: Not on file  . Marital Status: Not on file    Family History  Problem Relation Age of  Onset  . Stroke Mother   . Breast cancer Mother   . Hypertension Mother   . Diabetes Brother   . Stroke Brother   . Lupus Sister   . Diabetes Father   . Healthy Son   . Healthy Son     Outpatient Encounter Medications as of 10/22/2019  Medication Sig  . acetaminophen (TYLENOL) 500 MG tablet Take 500 mg by mouth every 6 (six) hours as needed for moderate pain.  Marland Kitchen albuterol (VENTOLIN HFA) 108 (90 Base) MCG/ACT inhaler INHALE 1 PUFF BY MOUTH EVERY 4 HOURS AS NEEDED  . amitriptyline (ELAVIL) 50 MG tablet TAKE 1 TABLET BY MOUTH AT BEDTIME  . ascorbic acid (VITAMIN C) 500 MG tablet Take 500 mg by mouth daily.  . Blood Glucose Monitoring Suppl (ONETOUCH VERIO) w/Device KIT 1 each by Does not apply route as needed.  . carvedilol (COREG) 6.25 MG tablet Take 1 tablet by mouth every 12 (twelve) hours.  . cholecalciferol (VITAMIN D3) 25 MCG (1000 UT) tablet Take 2 tablets (2,000 Units total) by mouth daily.  . ferrous sulfate 325 (65 FE) MG tablet Take 325 mg by mouth 3 (three) times daily with meals.  Marland Kitchen glucose blood (ONETOUCH VERIO) test strip Use as instructed  . Insulin Aspart (NOVOLOG [place]) Inject 10-16 Units into the skin See admin instructions.  . Insulin Degludec (TRESIBA) 100 UNIT/ML SOLN Inject 70 Units into the skin at bedtime. Take 50 units at bedtime  . ONE TOUCH ULTRA TEST test strip   . polyethylene glycol (MIRALAX / GLYCOLAX) packet Take 17 g by mouth daily as needed for mild constipation.   . rosuvastatin (CRESTOR) 20 MG tablet Take 1 tablet (20 mg total) by mouth daily.  . Zinc 100 MG TABS Take 1 tablet by mouth daily.  . [DISCONTINUED] Polysacch Fe Cmp-Fe Heme Poly (BIFERA) 28 MG TABS 1 po bid   No facility-administered encounter medications on file as of 10/22/2019.    ALLERGIES: Allergies  Allergen Reactions  . Other Hives and Shortness Of Breath    seafood  . Bacitracin   . Iodine Hives    shellfish  . Tramadol   . Neosporin [Neomycin-Bacitracin Zn-Polymyx] Rash     VACCINATION STATUS:  There is no immunization history on file for this patient.  Diabetes She presents for her follow-up diabetic visit. She has type 2 diabetes mellitus. Onset time: She was diagnosed at approximate age of 59 years. Her disease course has been improving (She may take significant NovoLog dosing errors.). There are no hypoglycemic associated symptoms. Pertinent negatives for hypoglycemia include no confusion, headaches, pallor or seizures. Associated  symptoms include fatigue, polydipsia and polyuria. Pertinent negatives for diabetes include no chest pain and no polyphagia. There are no hypoglycemic complications. Symptoms are improving. There are no diabetic complications. Risk factors for coronary artery disease include diabetes mellitus, dyslipidemia, sedentary lifestyle, post-menopausal and family history. Current diabetic treatment includes insulin injections (She is currently on Tresiba 50 units nightly, NovoLog 15 units 3 times daily AC.  She reports some hypoglycemia overnight.). Her weight is increasing steadily. She is following a generally unhealthy diet. When asked about meal planning, she reported none. She has not had a previous visit with a dietitian. Her home blood glucose trend is decreasing steadily. (She presents with slight improvement in her glycemic profile, point-of-care A1c of 10.8% improving from 12.4%.) An ACE inhibitor/angiotensin II receptor blocker is not being taken.  Hyperlipidemia This is a chronic problem. The current episode started more than 1 year ago. Exacerbating diseases include diabetes. Pertinent negatives include no chest pain, myalgias or shortness of breath. She is currently on no antihyperlipidemic treatment. Risk factors for coronary artery disease include diabetes mellitus, dyslipidemia, family history, a sedentary lifestyle and post-menopausal.     Review of Systems  Constitutional: Positive for fatigue. Negative for chills, fever and  unexpected weight change.  HENT: Negative for trouble swallowing and voice change.   Eyes: Negative for visual disturbance.  Respiratory: Negative for cough, shortness of breath and wheezing.   Cardiovascular: Negative for chest pain, palpitations and leg swelling.  Gastrointestinal: Negative for diarrhea, nausea and vomiting.  Endocrine: Positive for polydipsia and polyuria. Negative for cold intolerance, heat intolerance and polyphagia.  Musculoskeletal: Negative for arthralgias and myalgias.  Skin: Negative for color change, pallor, rash and wound.  Neurological: Negative for seizures and headaches.  Psychiatric/Behavioral: Negative for confusion and suicidal ideas.    Objective:    Vitals with BMI 10/22/2019 10/22/2019 10/07/2019  Height _0  _1  5' 6.5"  Weight 179 lbs 179 lbs 6 oz 169 lbs  BMI 28.91 61.60 73.71  Systolic - 062 -  Diastolic - 84 -  Pulse - 96 -    BP 136/84   Pulse 96   Ht _2  (1.676 m)   Wt 179 lb 6.4 oz (81.4 kg)   BMI 28.96 kg/m   Wt Readings from Last 3 Encounters:  10/22/19 179 lb 6.4 oz (81.4 kg)  10/22/19 179 lb (81.2 kg)  10/07/19 169 lb (76.7 kg)     Physical Exam Constitutional:      Appearance: She is well-developed.  HENT:     Head: Normocephalic and atraumatic.  Neck:     Thyroid: No thyromegaly.     Trachea: No tracheal deviation.  Cardiovascular:     Rate and Rhythm: Normal rate and regular rhythm.  Pulmonary:     Effort: Pulmonary effort is normal.  Abdominal:     Tenderness: There is no abdominal tenderness. There is no guarding.  Musculoskeletal:        General: Normal range of motion.     Cervical back: Normal range of motion and neck supple.  Skin:    General: Skin is warm and dry.     Coloration: Skin is not pale.     Findings: No erythema or rash.  Neurological:     Mental Status: She is alert and oriented to person, place, and time.     Cranial Nerves: No cranial nerve deficit.     Coordination: Coordination  normal.     Deep Tendon Reflexes: Reflexes are normal  and symmetric.  Psychiatric:        Judgment: Judgment normal.     CMP ( most recent) CMP     Component Value Date/Time   NA 137 02/01/2018 1038   NA 141 02/27/2012 0000   K 4.4 02/01/2018 1038   K 4.1 02/27/2012 0000   CL 100 02/01/2018 1038   CL 26 02/27/2012 0000   CO2 30 02/01/2018 1038   GLUCOSE 371 (H) 02/01/2018 1038   BUN 17 06/25/2019 0000   CREATININE 1.0 06/25/2019 0000   CREATININE 0.76 02/01/2018 1038   CALCIUM 9.6 02/01/2018 1038   CALCIUM 9.4 02/27/2012 0000   PROT 6.7 02/01/2018 1038   PROT 6.7 02/27/2012 0000   ALBUMIN 3.6 11/14/2016 1929   ALBUMIN 4.1 02/27/2012 0000   AST 15 02/01/2018 1038   AST 11 02/27/2012 0000   ALT 16 02/01/2018 1038   ALKPHOS 115 11/14/2016 1929   ALKPHOS 103 02/27/2012 0000   BILITOT 0.3 02/01/2018 1038   BILITOT 0.2 02/27/2012 0000   GFRNONAA >60 06/25/2019 0000   GFRNONAA 88 02/01/2018 1038   GFRAA >60 06/25/2019 0000   GFRAA 102 02/01/2018 1038     Diabetic Labs (most recent): Lab Results  Component Value Date   HGBA1C 12.4 06/25/2019   HGBA1C 10.0 (A) 02/27/2012     Lipid Panel ( most recent) Lipid Panel     Component Value Date/Time   CHOL 289 (A) 06/25/2019 0000   TRIG 205 (A) 06/25/2019 0000   HDL 36 06/25/2019 0000   LDLCALC 212 06/25/2019 0000      Lab Results  Component Value Date   TSH 2.34 06/25/2019   TSH 1.36 02/01/2018   TSH 1.671 04/02/2014   TSH 1.278 08/27/2012   FREET4 1.03 04/02/2014       Assessment & Plan:   1. Uncontrolled type 2 diabetes mellitus with hyperglycemia ([place])  - Brianna [name] has currently uncontrolled symptomatic type 2 DM since  59 years of age. She did not afford the co-pays for the CGM device.  She presents with a log in her meter showing persistently above target glycemic profile, slightly better than before.  She did not commit for proper monitoring to utilize all of that prescribed doses of  insulin.  She missed at least half of her NovoLog injection opportunities due to lack of monitoring.     Recent labs reviewed. - I had a long discussion with her about the progressive nature of diabetes and the pathology behind its complications. -She does not report gross complications from diabetes, however she remains at a high risk for more acute and chronic complications which include CAD, CVA, CKD, retinopathy, and neuropathy. These are all discussed in detail with her.  - I have counseled her on diet  and weight management  by adopting a carbohydrate restricted/protein rich diet. Patient is encouraged to switch to  unprocessed or minimally processed     complex starch and increased protein intake (animal or plant source), fruits, and vegetables. -  she is advised to stick to a routine mealtimes to eat 3 meals  a day and avoid unnecessary snacks ( to snack only to correct hypoglycemia).    - she  admits there is a room for improvement in her diet and drink choices. -  Suggestion is made for her to avoid simple carbohydrates  from her diet including Cakes, Sweet Desserts / Pastries, Ice Cream, Soda (diet and regular), Sweet Tea, Candies, Chips, Cookies, Sweet Pastries,  Store  Bought Juices, Alcohol in Excess of  1-2 drinks a day, Artificial Sweeteners, Coffee Creamer, and "Sugar-free" Products. This will help patient to have stable blood glucose profile and potentially avoid unintended weight gain.   - she will be scheduled with Jearld Fenton, RDN, CDE for diabetes education.  - I have approached her with the following individualized plan to manage  her diabetes and patient agrees:   -In light of her prevailing glycemic burden, she'll continue to need intensive treatment with basal/bolus insulin in order for her to achieve and maintain control of diabetes to target.   -Accordingly, she is advised to continue Tresiba 70 units nightly, advised to continue NovoLog 10 units 3 times a day with  meals  for pre-meal BG readings of 80-178m/dl, plus patient specific correction dose for unexpected hyperglycemia above 1554mdl, associated with strict monitoring of glucose 4 times a day-before meals and at bedtime. -This patient would have benefited from a CGM device, however unfortunately she did not afford the co-pays.     - she is warned not to take insulin without proper monitoring per orders. - Adjustment parameters are given to her for hypo and hyperglycemia in writing. - she is encouraged to call clinic for blood glucose levels less than 70 or above 300 mg /dl. -She will need work-up to classify her diabetes properly.  She will be considered for other non-insulin options on subsequent visits accordingly.  - Specific targets for  A1c;  LDL, HDL,  and Triglycerides were discussed with the patient.  2) Blood Pressure /Hypertension:  -Her blood pressure is controlled to target.  She is not on any antihypertensive medications.   3) Lipids/Hyperlipidemia:   Review of her recent lipid panel showed uncontrolled  LDL at 212 .  She was recently initiated on Crestor, continues to tolerate and advised to continue Crestor 20 mg p.o. nightly.   Side effects and precautions discussed with her.. Marland Kitchen   4)  Weight/Diet:  Body mass index is 28.96 kg/m.  -   she is not a candidate for major weight loss. I discussed with her the fact that loss of 5 - 10% of her  current body weight will have the most impact on her diabetes management.  Exercise, and detailed carbohydrates information provided  -  detailed on discharge instructions.  5) Chronic Care/Health Maintenance:  -she  is on Statin medications and  is encouraged to initiate and continue to follow up with Ophthalmology, Dentist,  Podiatrist at least yearly or according to recommendations, and advised to   stay away from smoking. I have recommended yearly flu vaccine and pneumonia vaccine at least every 5 years; moderate intensity exercise for up to  150 minutes weekly; and  sleep for at least 7 hours a day.  - she is  advised to maintain close follow up with TrBretta BangMD for primary care needs, as well as her other providers for optimal and coordinated care.  - Time spent on this patient care encounter:  35 min, of which > 50% was spent in  counseling and the rest reviewing her blood glucose logs , discussing her hypoglycemia and hyperglycemia episodes, reviewing her current and  previous labs / studies  ( including abstraction from other facilities) and medications  doses and developing a  long term treatment plan and documenting her care.   Please refer to Patient Instructions for Blood Glucose Monitoring and Insulin/Medications Dosing Guide"  in media tab for additional information. Please  also refer to "  Patient Self Inventory" in the Media  tab for reviewed elements of pertinent patient history.  Brianna [name] participated in the discussions, expressed understanding, and voiced agreement with the above plans.  All questions were answered to her satisfaction. she is encouraged to contact clinic should she have any questions or concerns prior to her return visit.    Follow up plan: - Return in about 3 months (around 01/22/2020) for F/U with Pre-visit Labs, Meter, Logs, A1c here., ABI in Office NV, Urine MA - NV.  Glade Lloyd, MD [facility] [address] [place], [place] [address] Phone: [phone]  Fax: [phone]    10/22/2019, 5:31 PM  This note was partially dictated with voice recognition software. Similar sounding words can be transcribed inadequately or may not  be corrected upon review.

## 2019-10-23 ENCOUNTER — Encounter: Payer: Self-pay | Admitting: Nutrition

## 2019-10-23 NOTE — Patient Instructions (Addendum)
Goals  Eat meals on time Test blood sugars before meals to determine how much meal time insulin to take. Try to ask insurance company again about a CGM and which one would be better covered.LIbre or Dexcom Get blood sugars less than 150 before meals and less than 180 at bedtime. Talk to boss about the need for scheduled meals to eat and take medications. Walk 30 minutes a day when you can.

## 2019-11-13 DIAGNOSIS — R7881 Bacteremia: Secondary | ICD-10-CM | POA: Insufficient documentation

## 2019-11-13 DIAGNOSIS — D6959 Other secondary thrombocytopenia: Secondary | ICD-10-CM | POA: Insufficient documentation

## 2019-11-13 DIAGNOSIS — A419 Sepsis, unspecified organism: Secondary | ICD-10-CM | POA: Insufficient documentation

## 2020-01-23 ENCOUNTER — Ambulatory Visit: Payer: BC Managed Care – PPO | Admitting: Nutrition

## 2020-01-23 ENCOUNTER — Ambulatory Visit: Payer: BLUE CROSS/BLUE SHIELD | Admitting: "Endocrinology

## 2020-02-26 ENCOUNTER — Telehealth: Payer: Self-pay

## 2020-02-26 NOTE — Telephone Encounter (Signed)
Received records request from [facility]-Sent to [facility]

## 2020-03-21 ENCOUNTER — Emergency Department (HOSPITAL_COMMUNITY)
Admission: EM | Admit: 2020-03-21 | Discharge: 2020-03-21 | Disposition: A | Discharge status: Home / Self Care | Payer: BC Managed Care – PPO | Attending: Emergency Medicine | Admitting: Emergency Medicine

## 2020-03-21 ENCOUNTER — Encounter (HOSPITAL_COMMUNITY): Payer: Self-pay | Admitting: Emergency Medicine

## 2020-03-21 ENCOUNTER — Other Ambulatory Visit: Payer: Self-pay

## 2020-03-21 DIAGNOSIS — R220 Localized swelling, mass and lump, head: Secondary | ICD-10-CM | POA: Insufficient documentation

## 2020-03-21 DIAGNOSIS — E1165 Type 2 diabetes mellitus with hyperglycemia: Secondary | ICD-10-CM | POA: Insufficient documentation

## 2020-03-21 DIAGNOSIS — Z79899 Other long term (current) drug therapy: Secondary | ICD-10-CM | POA: Insufficient documentation

## 2020-03-21 DIAGNOSIS — L299 Pruritus, unspecified: Secondary | ICD-10-CM | POA: Insufficient documentation

## 2020-03-21 DIAGNOSIS — I10 Essential (primary) hypertension: Secondary | ICD-10-CM | POA: Diagnosis not present

## 2020-03-21 DIAGNOSIS — R739 Hyperglycemia, unspecified: Secondary | ICD-10-CM

## 2020-03-21 DIAGNOSIS — X58XXXA Exposure to other specified factors, initial encounter: Secondary | ICD-10-CM | POA: Diagnosis not present

## 2020-03-21 DIAGNOSIS — Z794 Long term (current) use of insulin: Secondary | ICD-10-CM | POA: Diagnosis not present

## 2020-03-21 DIAGNOSIS — T368X5A Adverse effect of other systemic antibiotics, initial encounter: Secondary | ICD-10-CM | POA: Insufficient documentation

## 2020-03-21 DIAGNOSIS — T7840XA Allergy, unspecified, initial encounter: Secondary | ICD-10-CM

## 2020-03-21 LAB — BASIC METABOLIC PANEL
Anion gap: 8 (ref 5–15)
BUN: 28 mg/dL — ABNORMAL HIGH (ref 6–20)
CO2: 24 mmol/L (ref 22–32)
Calcium: 8.8 mg/dL — ABNORMAL LOW (ref 8.9–10.3)
Chloride: 100 mmol/L (ref 98–111)
Creatinine, Ser: 1.4 mg/dL — ABNORMAL HIGH (ref 0.44–1.00)
GFR, Estimated: 44 mL/min — ABNORMAL LOW (ref >60)
Glucose, Bld: 514 mg/dL (ref 70–99)
Potassium: 4.2 mmol/L (ref 3.5–5.1)
Sodium: 132 mmol/L — ABNORMAL LOW (ref 135–145)

## 2020-03-21 LAB — CBC WITH DIFFERENTIAL/PLATELET
Abs Immature Granulocytes: 0.01 10*3/uL (ref 0.00–0.07)
Basophils Absolute: 0.1 10*3/uL (ref 0.0–0.1)
Basophils Relative: 1 %
Eosinophils Absolute: 0.4 10*3/uL (ref 0.0–0.5)
Eosinophils Relative: 6 %
HCT: 30 % — ABNORMAL LOW (ref 36.0–46.0)
Hemoglobin: 9.7 g/dL — ABNORMAL LOW (ref 12.0–15.0)
Immature Granulocytes: 0 %
Lymphocytes Relative: 28 %
Lymphs Abs: 1.8 10*3/uL (ref 0.7–4.0)
MCH: 27.7 pg (ref 26.0–34.0)
MCHC: 32.3 g/dL (ref 30.0–36.0)
MCV: 85.7 fL (ref 80.0–100.0)
Monocytes Absolute: 0.5 10*3/uL (ref 0.1–1.0)
Monocytes Relative: 8 %
Neutro Abs: 3.7 10*3/uL (ref 1.7–7.7)
Neutrophils Relative %: 57 %
Platelets: 303 10*3/uL (ref 150–400)
RBC: 3.5 MIL/uL — ABNORMAL LOW (ref 3.87–5.11)
RDW: 13.9 % (ref 11.5–15.5)
WBC: 6.5 10*3/uL (ref 4.0–10.5)
nRBC: 0 % (ref 0.0–0.2)

## 2020-03-21 LAB — CBG MONITORING, ED
Glucose-Capillary: 124 mg/dL — ABNORMAL HIGH (ref 70–99)
Glucose-Capillary: 191 mg/dL — ABNORMAL HIGH (ref 70–99)

## 2020-03-21 MED ORDER — GLUCOSE BLOOD VI STRP: ORAL_STRIP | 0 refills | Status: AC

## 2020-03-21 MED ORDER — SODIUM CHLORIDE 0.9 % IV BOLUS
1000.0000 mL | Freq: Once | INTRAVENOUS | Status: AC
  Administered 2020-03-21: 1000 mL via INTRAVENOUS

## 2020-03-21 MED ORDER — FAMOTIDINE IN NACL 20-0.9 MG/50ML-% IV SOLN
20.0000 mg | Freq: Once | INTRAVENOUS | Status: AC
  Administered 2020-03-21: 20 mg via INTRAVENOUS
  Filled 2020-03-21: qty 50

## 2020-03-21 MED ORDER — DIPHENHYDRAMINE HCL 50 MG/ML IJ SOLN
25.0000 mg | Freq: Once | INTRAMUSCULAR | Status: AC
  Administered 2020-03-21: 25 mg via INTRAVENOUS
  Filled 2020-03-21: qty 1

## 2020-03-21 MED ORDER — METHYLPREDNISOLONE SODIUM SUCC 125 MG IJ SOLR
125.0000 mg | Freq: Once | INTRAMUSCULAR | Status: AC
  Administered 2020-03-21: 125 mg via INTRAVENOUS
  Filled 2020-03-21: qty 2

## 2020-03-21 MED ORDER — INSULIN ASPART 100 UNIT/ML IV SOLN
10.0000 [IU] | Freq: Once | INTRAVENOUS | Status: AC
  Administered 2020-03-21: 10 [IU] via INTRAVENOUS

## 2020-03-21 NOTE — ED Provider Notes (Signed)
[facility] Provider Note   CSN: [id number] Arrival date & time: 03/21/20  1620     History Chief Complaint  Patient presents with  . Allergic Reaction    Brianna [name] is a 60 y.o. female.  HPI      Brianna [name] is a 60 y.o. female with past medical history of type 2 diabetes who presents to the Emergency Department complaining of swelling and itching of her face.  She states that she was seen at the emergency department in [place] on 03/14/2020 for evaluation of pain and swelling of her left foot.  She was found to have cellulitis and started on clindamycin 300 mg every 6 hours.  She noticed swelling and itching around her upper face and eyes.  Symptoms worsened today which prompted her emergency room visit.  She took 125 mg Benadryl earlier today with mild improvement.  This afternoon, she noticed some mild difficulty swallowing and feeling as though she needs to "clear her throat."  She denies swelling of her lips or tongue, chest pain or shortness of breath.  She had one episode of nausea and vomiting yesterday but none today.  States this is the first time she is taking clindamycin but notes having welts if she consumes shellfish or consumes iodine.      Past Medical History:  Diagnosis Date  . Diabetes mellitus ([place])   . Gastritis   . GERD without esophagitis     Patient Active Problem List   Diagnosis Date Noted  . Vitamin D deficiency 10/22/2019  . Uncontrolled type 2 diabetes mellitus with hyperglycemia ([place]) 09/10/2019  . Mixed hyperlipidemia 09/10/2019  . Essential hypertension, benign 09/10/2019  . Normocytic anemia 12/19/2018  . Family history of systemic lupus erythematosus 02/23/2018  . History of diabetes mellitus 02/23/2018  . Heartburn   . Adult hypertrophic pyloric stenosis   . Dysphagia 08/16/2017  . GERD (gastroesophageal reflux disease) 11/21/2016  . Heart rate fast 04/21/2014  . Left flank pain  08/27/2012  . Loss of weight 08/27/2012  . Constipation 03/12/2012  . Abdominal wall pain 03/12/2012    Past Surgical History:  Procedure Laterality Date  . [place] STUDY  01/10/2018   Procedure: [place] STUDY;  Surgeon: Mauri Pole, MD;  Location: [facility];  Service: Endoscopy;;  . CARPAL TUNNEL RELEASE     left  . COLONOSCOPY WITH ESOPHAGOGASTRODUODENOSCOPY (EGD) N/A 04/02/2012   SLF: SMALL RECTAL polyp/Small internal hemorrhoids. Polyp hyperplastic. On EGD she had mild gastritis. Biopsy showed chronic and active gastritis and no H. pylori. Next colonoscopy in 10 years with overtube/? Propofol  . ESOPHAGEAL DILATION  12/09/2016   Procedure: ESOPHAGEAL DILATION;  Surgeon: Danie Binder, MD;  Location: [facility];  Service: Endoscopy;;  . ESOPHAGEAL MANOMETRY N/A 01/10/2018   Procedure: ESOPHAGEAL MANOMETRY (EM);  Surgeon: Mauri Pole, MD;  Location: [facility];  Service: Endoscopy;  Laterality: N/A;  . ESOPHAGOGASTRODUODENOSCOPY  03/2012   SLF: gastritis  . ESOPHAGOGASTRODUODENOSCOPY N/A 12/09/2016   Procedure: ESOPHAGOGASTRODUODENOSCOPY (EGD);  Surgeon: Danie Binder, MD;  Location: [facility];  Service: Endoscopy;  Laterality: N/A;  11:30am  . ESOPHAGOGASTRODUODENOSCOPY N/A 08/22/2017   Procedure: ESOPHAGOGASTRODUODENOSCOPY (EGD);  Surgeon: Danie Binder, MD;  Location: [facility];  Service: Endoscopy;  Laterality: N/A;  11:30am  . PARTIAL HYSTERECTOMY    . [place] IMPEDANCE STUDY N/A 01/10/2018   Procedure: [place] IMPEDANCE STUDY;  Surgeon: Mauri Pole, MD;  Location: [facility];  Service: Endoscopy;  Laterality: N/A;  . SAVORY DILATION N/A 08/22/2017   Procedure: SAVORY DILATION;  Surgeon: Danie Binder, MD;  Location: [facility];  Service: Endoscopy;  Laterality: N/A;  . TRIGGER FINGER RELEASE Left      OB History   No obstetric history on file.     Family History  Problem Relation Age of Onset  . Stroke Mother   . Breast cancer  Mother   . Hypertension Mother   . Diabetes Brother   . Stroke Brother   . Lupus Sister   . Diabetes Father   . Healthy Son   . Healthy Son     Social History   Tobacco Use  . Smoking status: Never Smoker  . Smokeless tobacco: Never Used  Vaping Use  . Vaping Use: Never used  Substance Use Topics  . Alcohol use: No    Alcohol/week: 0.0 standard drinks  . Drug use: No    Home Medications Prior to Admission medications   Medication Sig Start Date End Date Taking? Authorizing Provider  acetaminophen (TYLENOL) 500 MG tablet Take 500 mg by mouth every 6 (six) hours as needed for moderate pain.    [provider]  albuterol (VENTOLIN HFA) 108 (90 Base) MCG/ACT inhaler INHALE 1 PUFF BY MOUTH EVERY 4 HOURS AS NEEDED 04/22/19   [provider]  amitriptyline (ELAVIL) 50 MG tablet TAKE 1 TABLET BY MOUTH AT BEDTIME 07/30/18   Mahala Menghini, PA-C  ascorbic acid (VITAMIN C) 500 MG tablet Take 500 mg by mouth daily.    [provider]  Blood Glucose Monitoring Suppl (ONETOUCH VERIO) w/Device KIT 1 each by Does not apply route as needed. 08/20/19   Cassandria Anger, MD  carvedilol (COREG) 6.25 MG tablet Take 1 tablet by mouth every 12 (twelve) hours. 12/13/18   [provider]  cholecalciferol (VITAMIN D3) 25 MCG (1000 UT) tablet Take 2 tablets (2,000 Units total) by mouth daily. 12/19/18   Fields, Marga Melnick, MD  ferrous sulfate 325 (65 FE) MG tablet Take 325 mg by mouth 3 (three) times daily with meals.    [provider]  glucose blood (ONETOUCH VERIO) test strip Use as instructed 08/20/19   Cassandria Anger, MD  Insulin Aspart (NOVOLOG [place]) Inject 10-16 Units into the skin See admin instructions.    [provider]  Insulin Degludec (TRESIBA) 100 UNIT/ML SOLN Inject 70 Units into the skin at bedtime. Take 50 units at bedtime    [provider]  ONE TOUCH ULTRA TEST test strip  02/04/14   [provider]  polyethylene  glycol (MIRALAX / GLYCOLAX) packet Take 17 g by mouth daily as needed for mild constipation.     [provider]  rosuvastatin (CRESTOR) 20 MG tablet Take 1 tablet (20 mg total) by mouth daily. 08/20/19   Cassandria Anger, MD  Zinc 100 MG TABS Take 1 tablet by mouth daily.    [provider]    Allergies    Other, Bacitracin, Iodine, Tramadol, and Neosporin [neomycin-bacitracin zn-polymyx]  Review of Systems   Review of Systems  Constitutional: Negative for activity change, appetite change, chills and fever.  HENT: Positive for facial swelling and trouble swallowing. Negative for congestion, ear pain, sore throat and voice change.   Eyes: Positive for itching. Negative for pain, discharge and visual disturbance.  Respiratory: Negative for chest tightness, shortness of breath and wheezing.   Cardiovascular: Negative for chest pain.  Gastrointestinal: Negative  for abdominal pain.  Musculoskeletal: Negative for neck pain and neck stiffness.  Skin: Positive for rash. Negative for wound.  Neurological: Negative for dizziness, weakness, numbness and headaches.    Physical Exam Updated Vital Signs BP (!) 185/109   Pulse 99   Temp 98.6 F (37 C) (Oral)   Resp 14   Ht $R'5\' 8"'DW$  (1.727 m)   Wt 73 kg   SpO2 100%   BMI 24.48 kg/m   Physical Exam Vitals and nursing note reviewed.  Constitutional:      Appearance: Normal appearance. She is not toxic-appearing.  HENT:     Head:     Comments: Mild to moderate soft tissue edema of the bilateral periorbital region.  Mild erythema noted.      Mouth/Throat:     Mouth: Mucous membranes are moist.     Pharynx: Oropharynx is clear. Uvula midline. No uvula swelling.     Comments: Airway patent.  No edema of the lips or tongue.  Uvula is midline and nonedematous. Eyes:     Conjunctiva/sclera: Conjunctivae normal.     Pupils: Pupils are equal, round, and reactive to light.  Neck:     Thyroid: No thyromegaly.     Meningeal:  Kernig's sign absent.  Cardiovascular:     Rate and Rhythm: Normal rate and regular rhythm.     Heart sounds: Normal heart sounds.  Pulmonary:     Effort: Pulmonary effort is normal.     Breath sounds: Normal breath sounds. No wheezing.  Abdominal:     Palpations: Abdomen is soft.     Tenderness: There is no abdominal tenderness. There is no guarding or rebound.  Musculoskeletal:        General: Normal range of motion.     Cervical back: Normal range of motion and neck supple.     Right lower leg: No edema.     Left lower leg: No edema.     Comments: Examined pt's left foot.  No significant erythema or edema.  No open wounds of the foot or toes.    Skin:    General: Skin is warm.     Capillary Refill: Capillary refill takes less than 2 seconds.     Findings: No rash.     Comments: No edema or rash of the upper or lower extremities or trunk.  Neurological:     General: No focal deficit present.     Mental Status: She is alert.     Sensory: No sensory deficit.     Motor: No weakness.     ED Results / Procedures / Treatments   Labs (all labs ordered are listed, but only abnormal results are displayed) Labs Reviewed - No data to display  EKG None  Radiology No results found.  Procedures Procedures   Medications Ordered in ED Medications  diphenhydrAMINE (BENADRYL) injection 25 mg (25 mg Intravenous Given 03/21/20 1656)  methylPREDNISolone sodium succinate (SOLU-MEDROL) 125 mg/2 mL injection 125 mg (125 mg Intravenous Given 03/21/20 1655)  famotidine (PEPCID) IVPB 20 mg premix (0 mg Intravenous Stopped 03/21/20 1723)  sodium chloride 0.9 % bolus 1,000 mL (0 mLs Intravenous Stopped 03/21/20 1759)  insulin aspart (novoLOG) injection 10 Units (10 Units Intravenous Given 03/21/20 1730)    ED Course  I have reviewed the triage vital signs and the nursing notes.  Pertinent labs & imaging results that were available during my care of the patient were reviewed by me and  considered in my medical decision making (  see chart for details).    MDM Rules/Calculators/A&P                          Patient with history of type 2 diabetes here for evaluation of likely allergic reaction after taking clindamycin for a cellulitis of the left foot.  She has been taking the antibiotics since 03/14/2020.  Noticed swelling and itching of her face yesterday.  Some difficulty swallowing today upon arrival.  Patient taken 25 mg Benadryl this morning.  No other medications.  She admits to taking her morning dose of clindamycin before realizing that her symptoms may be related to a allergic reaction.  Denies chest pain or shortness of breath.  Does not take ACE inhibitors.  Denies any other new medications.  Patient will be given IV Solu-Medrol, Benadryl and Pepcid and observed in the department.  1730 patient blood sugar 514.  She states that she took 15 units of her NovoLog subcu prior to arrival, but also admits to eating a meal just before arrival.  Will administer IV fluids and insulin.  Anion gap is unremarkable, potassium within normal limits.  Patient's serum creatinine is also elevated at 1.4.  At last check, creatinine was 1 nine months ago.  1835  On recheck, pt resting comfortably, reports improvement of her symptoms.  No edema of the airway.  No further complications.  Patient has received a liter of IV normal saline, recheck of CBG now 191.  Will recheck CBG at 1900.  If no further complications i.e. hypoglycemia, patient will be discharged with instructions to discontinue clindamycin and take Benadryl.  Patient requests follow-up information regarding her left foot cellulitis. Exam of the left foot is reassuring.  Will be given information for podiatry.   Final Clinical Impression(s) / ED Diagnoses Final diagnoses:  Allergic reaction to drug, initial encounter  Hyperglycemia    Rx / DC Orders ED Discharge Orders    None       Kem Parkinson, Hershal Coria 03/21/20  Magdalen Spatz, MD 03/23/20 1036

## 2020-03-21 NOTE — ED Triage Notes (Signed)
Patient c/o allergic reaction to clindamycin. Patient placed on clindamycin 03/14/20 for cellulitis of left foot. Patient states started itching this yesterday and started having orbital/face swelling this morning. Per patient throat now feels irritated. Patient notable clearing throat intermittently. Patient does report taking Benadryl with last dose being 25mg  at 10am today.

## 2020-03-21 NOTE — Discharge Instructions (Signed)
Discontinue the clindamycin.  I recommend that you continue to take Benadryl, 1 capsule every 4-6 hours for 3 days.  This will likely cause drowsiness.  Avoid operating machinery or driving while taking Benadryl.  Also you have been given IV steroids this evening.  This will likely elevate your blood sugar.  Please monitor your blood sugar closely and follow-up with your primary care provider for recheck.  Return to the emergency department if you develop any worsening symptoms.

## 2020-03-21 NOTE — ED Notes (Signed)
FSGlucose 191

## 2020-03-21 NOTE — ED Notes (Signed)
ED Provider at bedside. 

## 2020-04-27 ENCOUNTER — Telehealth: Payer: Self-pay

## 2020-04-27 NOTE — Telephone Encounter (Signed)
Patient was discharged from our practice in 2021 due to establishing with Dr. Posey Pronto (gastroenterology). She will need to receive any further refills from her primary care provider or Dr. Posey Pronto as appropriate.

## 2020-04-27 NOTE — Telephone Encounter (Signed)
Noted  

## 2020-04-27 NOTE — Telephone Encounter (Signed)
Refill request received from [facility] for Vitamin D3, #30, take 2 tablets po once daily.

## 2021-01-05 DIAGNOSIS — D509 Iron deficiency anemia, unspecified: Secondary | ICD-10-CM | POA: Insufficient documentation

## 2021-01-06 DIAGNOSIS — N184 Chronic kidney disease, stage 4 (severe): Secondary | ICD-10-CM | POA: Insufficient documentation

## 2021-03-22 DIAGNOSIS — N390 Urinary tract infection, site not specified: Secondary | ICD-10-CM | POA: Insufficient documentation

## 2021-03-22 DIAGNOSIS — E1121 Type 2 diabetes mellitus with diabetic nephropathy: Secondary | ICD-10-CM | POA: Insufficient documentation

## 2021-03-22 DIAGNOSIS — E039 Hypothyroidism, unspecified: Secondary | ICD-10-CM | POA: Insufficient documentation

## 2021-03-22 DIAGNOSIS — K5989 Other specified functional intestinal disorders: Secondary | ICD-10-CM | POA: Insufficient documentation

## 2021-03-22 DIAGNOSIS — M779 Enthesopathy, unspecified: Secondary | ICD-10-CM | POA: Insufficient documentation

## 2021-03-22 DIAGNOSIS — N132 Hydronephrosis with renal and ureteral calculous obstruction: Secondary | ICD-10-CM | POA: Insufficient documentation

## 2021-03-22 DIAGNOSIS — N200 Calculus of kidney: Secondary | ICD-10-CM | POA: Insufficient documentation

## 2021-03-22 DIAGNOSIS — Z794 Long term (current) use of insulin: Secondary | ICD-10-CM | POA: Insufficient documentation

## 2021-03-22 DIAGNOSIS — G909 Disorder of the autonomic nervous system, unspecified: Secondary | ICD-10-CM | POA: Insufficient documentation

## 2021-03-22 DIAGNOSIS — N912 Amenorrhea, unspecified: Secondary | ICD-10-CM | POA: Insufficient documentation

## 2021-09-08 DIAGNOSIS — S92302K Fracture of unspecified metatarsal bone(s), left foot, subsequent encounter for fracture with nonunion: Secondary | ICD-10-CM | POA: Insufficient documentation

## 2021-09-08 DIAGNOSIS — M216X2 Other acquired deformities of left foot: Secondary | ICD-10-CM | POA: Insufficient documentation

## 2021-09-18 DIAGNOSIS — F32A Depression, unspecified: Secondary | ICD-10-CM | POA: Insufficient documentation

## 2021-09-19 ENCOUNTER — Emergency Department (HOSPITAL_COMMUNITY)
Admission: EM | Admit: 2021-09-19 | Discharge: 2021-09-19 | Disposition: A | Discharge status: Home / Self Care | Payer: BC Managed Care – PPO | Attending: Emergency Medicine | Admitting: Emergency Medicine

## 2021-09-19 ENCOUNTER — Encounter (HOSPITAL_COMMUNITY): Payer: Self-pay | Admitting: Emergency Medicine

## 2021-09-19 DIAGNOSIS — H5789 Other specified disorders of eye and adnexa: Secondary | ICD-10-CM | POA: Diagnosis present

## 2021-09-19 DIAGNOSIS — H1013 Acute atopic conjunctivitis, bilateral: Secondary | ICD-10-CM | POA: Insufficient documentation

## 2021-09-19 DIAGNOSIS — T7840XA Allergy, unspecified, initial encounter: Secondary | ICD-10-CM | POA: Diagnosis not present

## 2021-09-19 MED ORDER — METHYLPREDNISOLONE SODIUM SUCC 125 MG IJ SOLR
125.0000 mg | Freq: Once | INTRAMUSCULAR | Status: AC
  Administered 2021-09-19: 125 mg via INTRAMUSCULAR
  Filled 2021-09-19: qty 2

## 2021-09-19 NOTE — Discharge Instructions (Addendum)
Continue current allergy medications

## 2021-09-19 NOTE — ED Provider Notes (Signed)
[facility] EMERGENCY DEPARTMENT Provider Note   CSN: [id number] Arrival date & time: 09/19/21  2027     History  Chief Complaint  Patient presents with   Allergic Reaction    Brianna [name] is a 61 y.o. female.  Pt complains of itching and swelling around both eyes.  Pt reports swelling has increased.  Pt reports she had similar in the past and got an injection here that helped.  Pt has been using eye drops and medication her MD gave her for similar in the past.  Pt reports no relief  The history is provided by the patient. No language interpreter was used.  Allergic Reaction Presenting symptoms: itching   Severity:  Moderate Prior allergic episodes:  No prior episodes Relieved by:  Nothing Worsened by:  Nothing Ineffective treatments:  None tried      Home Medications Prior to Admission medications   Medication Sig Start Date End Date Taking? Authorizing Provider  acetaminophen (TYLENOL) 500 MG tablet Take 500 mg by mouth every 6 (six) hours as needed for moderate pain.    [provider]  albuterol (VENTOLIN HFA) 108 (90 Base) MCG/ACT inhaler INHALE 1 PUFF BY MOUTH EVERY 4 HOURS AS NEEDED 04/22/19   [provider]  amitriptyline (ELAVIL) 50 MG tablet TAKE 1 TABLET BY MOUTH AT BEDTIME 07/30/18   Mahala Menghini, PA-C  ascorbic acid (VITAMIN C) 500 MG tablet Take 500 mg by mouth daily.    [provider]  Blood Glucose Monitoring Suppl (ONETOUCH VERIO) w/Device KIT 1 each by Does not apply route as needed. 08/20/19   Cassandria Anger, MD  carvedilol (COREG) 6.25 MG tablet Take 1 tablet by mouth every 12 (twelve) hours. 12/13/18   [provider]  cholecalciferol (VITAMIN D3) 25 MCG (1000 UT) tablet Take 2 tablets (2,000 Units total) by mouth daily. 12/19/18   Fields, Marga Melnick, MD  ferrous sulfate 325 (65 FE) MG tablet Take 325 mg by mouth 3 (three) times daily with meals.    [provider]  glucose blood (ONETOUCH  VERIO) test strip Use as instructed 08/20/19   Cassandria Anger, MD  glucose blood test strip One touch verio glucose test strip.  Use as instructed 03/21/20   Triplett, Tammy, PA-C  Insulin Aspart (NOVOLOG [place]) Inject 10-16 Units into the skin See admin instructions.    [provider]  Insulin Degludec (TRESIBA) 100 UNIT/ML SOLN Inject 70 Units into the skin at bedtime. Take 50 units at bedtime    [provider]  polyethylene glycol (MIRALAX / GLYCOLAX) packet Take 17 g by mouth daily as needed for mild constipation.     [provider]  rosuvastatin (CRESTOR) 20 MG tablet Take 1 tablet (20 mg total) by mouth daily. 08/20/19   Cassandria Anger, MD  Zinc 100 MG TABS Take 1 tablet by mouth daily.    [provider]      Allergies    Other, Bacitracin, Iodine, Tramadol, and Neosporin [neomycin-bacitracin zn-polymyx]    Review of Systems   Review of Systems  Skin:  Positive for itching.  All other systems reviewed and are negative.   Physical Exam Updated Vital Signs BP (!) 131/103 (BP Location: Right Arm)   Pulse (!) 101   Temp 98.8 F (37.1 C) (Oral)   Resp 17   Ht 5' 8"  (1.727 m)   Wt 73 kg   SpO2 100%   BMI 24.47 kg/m  Physical Exam Vitals and nursing  note reviewed.  Constitutional:      Appearance: She is well-developed.  HENT:     Head: Normocephalic.     Right Ear: External ear normal.     Left Ear: External ear normal.  Eyes:     Comments: Swelling around bilat eyes, drainage bilat crusting,  erythema bilat conjunctiva   Cardiovascular:     Rate and Rhythm: Normal rate.  Pulmonary:     Effort: Pulmonary effort is normal.  Abdominal:     General: There is no distension.  Musculoskeletal:        General: Normal range of motion.     Cervical back: Normal range of motion.  Skin:    General: Skin is warm.  Neurological:     General: No focal deficit present.     Mental Status: She is alert and oriented to person, place,  and time.  Psychiatric:        Mood and Affect: Mood normal.     ED Results / Procedures / Treatments   Labs (all labs ordered are listed, but only abnormal results are displayed) Labs Reviewed - No data to display  EKG None  Radiology No results found.  Procedures Procedures    Medications Ordered in ED Medications  methylPREDNISolone sodium succinate (SOLU-MEDROL) 125 mg/2 mL injection 125 mg (125 mg Intramuscular Given 09/19/21 2135)    ED Course/ Medical Decision Making/ A&P                           Medical Decision Making Pt complains of allergies. Pt reports both eyes are red, tearing and that she has swelling around her eyes.  No fever, no chills no cough or uri symptoms   Amount and/or Complexity of Data Reviewed Independent Historian: spouse    Details: Pt is here with spouse who is supportive   Risk OTC drugs. Risk Details: Pt advised to continue zyrtec.  Pt given injection of solumedrol IM.           Final Clinical Impression(s) / ED Diagnoses Final diagnoses:  Allergic reaction, initial encounter  Allergic conjunctivitis of both eyes    Rx / DC Orders ED Discharge Orders     None     An After Visit Summary was printed and given to the patient.     Sidney Ace 09/19/21 2154    Noemi Chapel, MD 09/20/21 1101

## 2021-09-19 NOTE — ED Triage Notes (Signed)
Pt arrives POV c/o allergic reaction with swelling to both eye since yesterday morning. Pt states she took 2 prescribed medications that she was given for similar reaction several months ago.

## 2022-03-15 ENCOUNTER — Encounter: Payer: Self-pay | Admitting: *Deleted

## 2022-07-08 NOTE — Progress Notes (Unsigned)
Cardiology Office Note:   Date:  07/08/2022  NAME:  Brianna [name]    MRN: [id number] DOB:  08/14/1960   PCP:  Mardella Layman, MD  Cardiologist:  None  Electrophysiologist:  None   Referring MD: Mardella Layman, MD   No chief complaint on file.   History of Present Illness:   Brianna [name] is a 62 y.o. female with a hx of CKD IV, DM, HTN who is being seen today for the evaluation of SOB at the request of Mardella Layman, MD.  ***  Problem List CKD IV DM HTN  Past Medical History: Past Medical History:  Diagnosis Date   Diabetes mellitus (HCC)    Gastritis    GERD without esophagitis     Past Surgical History: Past Surgical History:  Procedure Laterality Date   50 HOUR PH STUDY  01/10/2018   Procedure: 24 HOUR PH STUDY;  Surgeon: Napoleon Form, MD;  Location: [facility];  Service: Endoscopy;;   CARPAL TUNNEL RELEASE     left   COLONOSCOPY WITH ESOPHAGOGASTRODUODENOSCOPY (EGD) N/A 04/02/2012   SLF: SMALL RECTAL polyp/Small internal hemorrhoids. Polyp hyperplastic. On EGD she had mild gastritis. Biopsy showed chronic and active gastritis and no H. pylori. Next colonoscopy in 10 years with overtube/? Propofol   ESOPHAGEAL DILATION  12/09/2016   Procedure: ESOPHAGEAL DILATION;  Surgeon: West Bali, MD;  [facility];  Service: Endoscopy;;   ESOPHAGEAL MANOMETRY N/A 01/10/2018   Procedure: ESOPHAGEAL MANOMETRY (EM);  Surgeon: Napoleon Form, MD;  Location: [facility];  Service: Endoscopy;  Laterality: N/A;   ESOPHAGOGASTRODUODENOSCOPY  03/2012   SLF: gastritis   ESOPHAGOGASTRODUODENOSCOPY N/A 12/09/2016   Procedure: ESOPHAGOGASTRODUODENOSCOPY (EGD);  Surgeon: West Bali, MD;  [facility];  Service: Endoscopy;  Laterality: N/A;  11:30am   ESOPHAGOGASTRODUODENOSCOPY N/A 08/22/2017   Procedure: ESOPHAGOGASTRODUODENOSCOPY (EGD);  Surgeon: West Bali, MD;  [facility];  Service: Endoscopy;  Laterality:  N/A;  11:30am   PARTIAL HYSTERECTOMY     PH IMPEDANCE STUDY N/A 01/10/2018   Procedure: PH IMPEDANCE STUDY;  Surgeon: Napoleon Form, MD;  [facility];  Service: Endoscopy;  Laterality: N/A;   SAVORY DILATION N/A 08/22/2017   Procedure: SAVORY DILATION;  Surgeon: West Bali, MD;  [facility];  Service: Endoscopy;  Laterality: N/A;   TRIGGER FINGER RELEASE Left     Current Medications: No outpatient medications have been marked as taking for the 07/14/22 encounter (Appointment) with O'Neal, Ronnald Ramp, MD.     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 STILL WORKING AT THE [employer].   Social Determinants of Corporate investment banker  Strain: Not on file  Food Insecurity: Not on file  Transportation Needs: Not on file  Physical Activity: Not on file  Stress: Not on file  Social Connections: Not on file     Family History: The patient's family history includes Breast cancer in her mother; Diabetes in her brother and father; Healthy in her son and son; Hypertension in her mother; Lupus in her sister; Stroke in her brother and mother.  ROS:   All other ROS reviewed and negative. Pertinent positives noted in the HPI.      EKGs/Labs/Other Studies Reviewed:   The following studies were personally reviewed by me today:  EKG:  EKG is *** ordered today.        Recent Labs: No results found for requested labs within last 365 days.   Recent Lipid Panel    Component Value Date/Time   CHOL 289 (A) 06/25/2019 0000   TRIG 205 (A) 06/25/2019 0000   HDL 36 06/25/2019 0000   LDLCALC 212 06/25/2019 0000    Physical Exam:   VS:  There were no vitals taken for this visit.   Wt Readings from Last 3 Encounters:  09/19/21 160 lb 15 oz (73 kg)  03/21/20 161 lb (73 kg)  10/22/19 179 lb 6.4 oz (81.4 kg)    General: Well nourished, well developed, in no acute distress Head: Atraumatic, normal size  Eyes: PEERLA, EOMI  Neck: Supple, no JVD Endocrine: No thryomegaly Cardiac: Normal S1, S2; RRR; no murmurs, rubs, or gallops Lungs: Clear to auscultation bilaterally, no wheezing, rhonchi or rales  Abd: Soft, nontender, no hepatomegaly  Ext: No edema, pulses 2+ Musculoskeletal: No deformities, BUE and BLE strength normal and equal Skin: Warm and dry, no rashes   Neuro: Alert and oriented to person, place, time, and situation, CNII-XII grossly intact, no focal deficits  Psych: Normal mood and affect   ASSESSMENT:   Brianna [name] is a 62 y.o. female who presents for the following: No diagnosis found.  PLAN:   There are no diagnoses linked to this encounter.  {Are you ordering a CV Procedure (e.g. stress test, cath, DCCV, TEE, etc)?   Press F2        :[id number]}  Disposition: No follow-ups on file.  Medication Adjustments/Labs and Tests Ordered: Current medicines are reviewed at length with the patient today.  Concerns regarding medicines are outlined above.  No orders of the defined types were placed in this encounter.  No orders of the defined types were placed in this encounter.  There are no Patient Instructions on file for this visit.   Time Spent with Patient: I have spent a total of ***  minutes with patient reviewing hospital notes, telemetry, EKGs, labs and examining the patient as well as establishing an assessment and plan that was discussed with the patient.  > 50% of time was spent in direct patient care.  Signed, Lenna Gilford. Flora Lipps, MD, [facility]  [facility]  [address] [place], [place] [address] [phone]  07/08/2022 4:01 PM

## 2022-07-14 ENCOUNTER — Ambulatory Visit
Admission: RE | Admit: 2022-07-14 | Discharge: 2022-07-14 | Disposition: A | Discharge status: Home / Self Care | Payer: BC Managed Care – PPO | Source: Ambulatory Visit | Attending: Cardiovascular Disease | Admitting: Cardiovascular Disease

## 2022-07-14 ENCOUNTER — Ambulatory Visit: Payer: BC Managed Care – PPO | Attending: Cardiovascular Disease | Admitting: Cardiovascular Disease

## 2022-07-14 ENCOUNTER — Encounter: Payer: Self-pay | Admitting: Cardiovascular Disease

## 2022-07-14 VITALS — BP 100/72 | HR 85 | Ht 68.0 in | Wt 177.8 lb

## 2022-07-14 DIAGNOSIS — E782 Mixed hyperlipidemia: Secondary | ICD-10-CM

## 2022-07-14 DIAGNOSIS — R0602 Shortness of breath: Secondary | ICD-10-CM

## 2022-07-14 DIAGNOSIS — R0989 Other specified symptoms and signs involving the circulatory and respiratory systems: Secondary | ICD-10-CM | POA: Diagnosis not present

## 2022-07-14 DIAGNOSIS — R072 Precordial pain: Secondary | ICD-10-CM | POA: Diagnosis not present

## 2022-07-14 DIAGNOSIS — I10 Essential (primary) hypertension: Secondary | ICD-10-CM | POA: Diagnosis not present

## 2022-07-14 NOTE — Patient Instructions (Addendum)
Medication Instructions:  Your physician recommends that you continue on your current medications as directed. Please refer to the Current Medication list given to you today.   *If you need a refill on your cardiac medications before your next appointment, please call your pharmacy*   Testing/Procedures: A chest X ray has been ordered ---go to [facility] today --[address]---I will call you when I get the report (usually about an hour after you complete the X ray. There is no appointment needed.    Your physician has requested that you have a lexiscan myoview. For further information please visit [email or web address] Please follow instruction sheet, as given. This will take place at [address], [address]  How to prepare for your Myocardial Perfusion Test: Do not eat or drink 3 hours prior to your test, except you may have water. Do not consume products containing caffeine (regular or decaffeinated) 12 hours prior to your test. (ex: coffee, chocolate, sodas, tea). Do bring a list of your current medications with you.  If not listed below, you may take your medications as normal. Do wear comfortable clothes (no dresses or overalls) and walking shoes, tennis shoes preferred (No heels or open toe shoes are allowed). Do NOT wear cologne, perfume, aftershave, or lotions (deodorant is allowed). The test will take approximately 3 to 4 hours to complete If these instructions are not followed, your test will have to be rescheduled.    Your physician has requested that you have a carotid duplex. This test is an ultrasound of the carotid arteries in your neck. It looks at blood flow through these arteries that supply the brain with blood. Allow one hour for this exam. There are no restrictions or special instructions. This will take place at [address] [facility], [address].   Follow-Up: At [facility], you and your health needs are our priority.  As part of our continuing  mission to provide you with exceptional heart care, we have created designated Provider Care Teams.  These Care Teams include your primary Cardiologist (physician) and Advanced Practice Providers (APPs -  Physician Assistants and Nurse Practitioners) who all work together to provide you with the care you need, when you need it.  We recommend signing up for the patient portal called "MyChart".  Sign up information is provided on this After Visit Summary.  MyChart is used to connect with patients for Virtual Visits (Telemedicine).  Patients are able to view lab/test results, encounter notes, upcoming appointments, etc.  Non-urgent messages can be sent to your provider as well.   To learn more about what you can do with MyChart, go to [email or web address].    Your next appointment:   3 month(s)  Provider:   Dr. Lennie Odor

## 2022-07-20 ENCOUNTER — Encounter (HOSPITAL_COMMUNITY): Payer: BC Managed Care – PPO

## 2022-07-20 ENCOUNTER — Telehealth (HOSPITAL_COMMUNITY): Payer: Self-pay | Admitting: *Deleted

## 2022-07-20 NOTE — Telephone Encounter (Signed)
Left a detailed message on cell answering service about her up coming Stress Test on 07/14/22 at 8.

## 2022-07-22 ENCOUNTER — Ambulatory Visit (HOSPITAL_COMMUNITY): Payer: BC Managed Care – PPO

## 2022-07-26 ENCOUNTER — Ambulatory Visit: Payer: BC Managed Care – PPO | Attending: Cardiovascular Disease

## 2022-07-26 DIAGNOSIS — R0989 Other specified symptoms and signs involving the circulatory and respiratory systems: Secondary | ICD-10-CM | POA: Diagnosis not present

## 2022-07-27 ENCOUNTER — Other Ambulatory Visit: Payer: Self-pay | Admitting: *Deleted

## 2022-07-27 DIAGNOSIS — I6523 Occlusion and stenosis of bilateral carotid arteries: Secondary | ICD-10-CM

## 2022-08-03 NOTE — Telephone Encounter (Signed)
Spoke with patient and gave detailed instructions about her test on 08/05/22 at 10:30.

## 2022-08-05 ENCOUNTER — Ambulatory Visit (HOSPITAL_COMMUNITY): Payer: BC Managed Care – PPO | Attending: Cardiovascular Disease

## 2022-08-05 DIAGNOSIS — E782 Mixed hyperlipidemia: Secondary | ICD-10-CM | POA: Insufficient documentation

## 2022-08-05 DIAGNOSIS — R0602 Shortness of breath: Secondary | ICD-10-CM | POA: Diagnosis not present

## 2022-08-05 DIAGNOSIS — R072 Precordial pain: Secondary | ICD-10-CM | POA: Diagnosis not present

## 2022-08-05 DIAGNOSIS — I10 Essential (primary) hypertension: Secondary | ICD-10-CM | POA: Diagnosis not present

## 2022-08-05 LAB — MYOCARDIAL PERFUSION IMAGING
LV dias vol: 100 mL (ref 46–106)
LV sys vol: 64 mL
Nuc Stress EF: 36 %
Peak HR: 91 {beats}/min
Rest HR: 81 {beats}/min
Rest Nuclear Isotope Dose: 10.3 mCi
SDS: 5
SRS: 3
SSS: 8
Stress Nuclear Isotope Dose: 31.9 mCi
TID: 0.9

## 2022-08-05 MED ORDER — REGADENOSON 0.4 MG/5ML IV SOLN
0.4000 mg | Freq: Once | INTRAVENOUS | Status: AC
  Administered 2022-08-05: 0.4 mg via INTRAVENOUS

## 2022-08-05 MED ORDER — TECHNETIUM TC 99M TETROFOSMIN IV KIT
31.9000 | PACK | Freq: Once | INTRAVENOUS | Status: AC | PRN
  Administered 2022-08-05: 31.9 via INTRAVENOUS

## 2022-08-05 MED ORDER — TECHNETIUM TC 99M TETROFOSMIN IV KIT
10.3000 | PACK | Freq: Once | INTRAVENOUS | Status: AC | PRN
  Administered 2022-08-05: 10.3 via INTRAVENOUS

## 2022-08-10 ENCOUNTER — Telehealth: Payer: Self-pay | Admitting: *Deleted

## 2022-08-10 DIAGNOSIS — R0602 Shortness of breath: Secondary | ICD-10-CM

## 2022-08-10 NOTE — Telephone Encounter (Signed)
-----   Message from Reatha Harps sent at 08/06/2022  7:16 AM EDT ----- She needs an echo. Not sure what happened but she was supposed to get an echo too.   I will send a my chart message that her perfusion imaging was normal.   Gerri Spore T. Flora Lipps, MD, [facility]  [facility]  [address] [place], [place] [address] [phone]  7:15 AM

## 2022-08-10 NOTE — Telephone Encounter (Signed)
Your stress test did not show any blockages which is reassuring. However, it did suggest that your heart function may be weak. We will need to get an ultrasound of your heart which my nurse will arrange. I will be in touch after this test.   Marvene Staff T. Flora Lipps, MD, [facility]  [facility] [address] [place], [place] [address] [phone] 7:16 AM

## 2022-08-10 NOTE — Telephone Encounter (Signed)
The patient has been notified of the result and verbalized understanding.  All questions (if any) were answered. Aware Echocardiogram ordered. Tobin Chad, RN 08/10/2022 4:33 PM

## 2022-08-29 ENCOUNTER — Ambulatory Visit (HOSPITAL_COMMUNITY): Payer: BC Managed Care – PPO | Attending: Cardiovascular Disease

## 2022-08-29 DIAGNOSIS — R0602 Shortness of breath: Secondary | ICD-10-CM | POA: Insufficient documentation

## 2022-08-29 LAB — ECHOCARDIOGRAM COMPLETE
Area-P 1/2: 4.96 cm2
S' Lateral: 3.1 cm

## 2022-11-01 NOTE — Progress Notes (Unsigned)
Cardiology Office Note:  .   Date:  11/03/2022  ID:  Brianna [name], DOB 12-Sep-1960, MRN [id number] PCP: Tamsen Roers, NP  [facility] Providers Cardiologist:  None {  History of Present Illness: .   Brianna [name] is a 62 y.o. female with history of CKD IV, DM, HTN, HLD, carotid artery disease who presents for follow-up.   Discussed the use of AI scribe software for clinical note transcription with the patient, who gave verbal consent to proceed.  History of Present Illness   Brianna [name], a 62 year old female with a history of CKD stage IV, diabetes, hypertension, hyperlipidemia, and chronic artery disease, presents for a follow-up visit. Several months ago, she experienced shortness of breath and underwent an echocardiogram and a stress test. The echocardiogram was largely normal, and the stress test showed normal perfusion. Recently, she has been feeling weak and has concerns about her low blood pressure. HGB 9.0. She is unsure if her weakness is due to her medication or another cause. She also reports being short of breath, which she attributes to her lack of exercise. 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 She is scheduled to see a kidney doctor at [facility] next week. BP today quite low but no symptoms.          Problem List Carotid artery disease -R ICA 1-39% -L ICA 40-59% CKD IV -eGFR 20 3. DM -A1c 7.5 4. HTN 5. HLD -T chol 272, TG 307, HDL 42, LDL 169 6. Anemia -HGB 9.0     ROS: All other ROS reviewed and negative. Pertinent positives noted in the HPI.     Studies Reviewed: Marland Kitchen        TTE 08/29/2022  1. Left ventricular ejection fraction, by estimation, is 60 to 65%. Left  ventricular ejection fraction by 3D volume is 64 %. The left ventricle has  normal function. The left ventricle has no regional wall motion  abnormalities. There  is moderate left  ventricular hypertrophy. Left ventricular diastolic parameters are  consistent with Grade I diastolic dysfunction (impaired relaxation).  Elevated left ventricular end-diastolic pressure. The E/e' is 22. The  average left ventricular global longitudinal  strain is -11.8 %. The global longitudinal strain is abnormal.   2. Right ventricular systolic function is low normal. The right  ventricular size is normal. Tricuspid regurgitation signal is inadequate  for assessing PA pressure.   3. The mitral valve is degenerative. Trivial mitral valve regurgitation.   4. The aortic valve is tricuspid. Aortic valve regurgitation is not  visualized. No aortic stenosis is present.   NM Stress 08/05/2022 Normal perfusion Moderate systolic dysfunction, EF 36% Intermediate risk study due to systolic dysfunction Physical Exam:   VS:  BP (!) 81/57   Pulse 88   Ht 5\' 7"  (1.702 m)   Wt 178 lb (80.7 kg)   SpO2 100%   BMI 27.88 kg/m    Wt Readings from Last 3 Encounters:  11/03/22 178 lb (80.7 kg)  07/14/22 177 lb 12.8 oz (80.6 kg)  09/19/21 160 lb 15 oz (73 kg)    GEN: Well nourished, well developed in no acute distress NECK: No JVD; L carotid bruit CARDIAC: RRR, no murmurs, rubs, gallops RESPIRATORY:  Clear to auscultation without rales, wheezing or rhonchi  ABDOMEN: Soft, non-tender, non-distended EXTREMITIES:  No edema; No deformity  ASSESSMENT AND PLAN: .   Assessment  and Plan    Hypotension Patient reports feeling weak. Blood pressure is low, possibly due to antihypertensive medications (Carvedilol and Lisinopril). -BP 80/60 today. No dizziness or syncope. -Discontinue Carvedilol and Lisinopril. -Monitor blood pressure twice daily at home. -Follow-up with PA in 4 weeks to recheck blood pressure.   SOB Anemia Likely contributing to patient's reported shortness of breath. Normal echo and negative nuclear MPI study.  -Refer to nephrologist for management of anemia related  to CKD stage 4. -Consider colonoscopy to rule out internal bleeding as a cause of anemia.  Carotid Artery Disease Moderate plaque buildup in left carotid artery. LDL 169, triglycerides 307. -Discontinue Crestor and start Lipitor 80mg . -Start daily baby aspirin. -Recheck cholesterol levels in 4 weeks. -Repeat carotid ultrasound in 1 year.  Deconditioning Likely contributing to patient's reported shortness of breath. -Encourage regular, non-stressful exercise, such as walking at the [facility] for 20 minutes every other day.  Diabetes A1c 7.5 -Continue current management. -Encourage diet low in carbs and saturated fats, high in fresh fruits and vegetables.  Follow-up Return in 6 months for re-evaluation.              Follow-up: Return in about 6 months (around 05/03/2023).  Time Spent with Patient: I have spent a total of 35 minutes caring for this patient today face to face, ordering and reviewing labs/tests, reviewing prior records/medical history, examining the patient, establishing an assessment and plan, communicating results/findings to the patient/family, and documenting in the medical record.   Signed, Lenna Gilford. Flora Lipps, MD, [facility]  [facility]  [address], [address] [place], [place] [address] [phone]  2:13 PM

## 2022-11-03 ENCOUNTER — Encounter: Payer: Self-pay | Admitting: Cardiovascular Disease

## 2022-11-03 ENCOUNTER — Ambulatory Visit: Payer: BC Managed Care – PPO | Attending: Cardiovascular Disease | Admitting: Cardiovascular Disease

## 2022-11-03 VITALS — BP 81/57 | HR 88 | Ht 67.0 in | Wt 178.0 lb

## 2022-11-03 DIAGNOSIS — I6523 Occlusion and stenosis of bilateral carotid arteries: Secondary | ICD-10-CM

## 2022-11-03 DIAGNOSIS — R0602 Shortness of breath: Secondary | ICD-10-CM | POA: Diagnosis not present

## 2022-11-03 DIAGNOSIS — I10 Essential (primary) hypertension: Secondary | ICD-10-CM

## 2022-11-03 DIAGNOSIS — E782 Mixed hyperlipidemia: Secondary | ICD-10-CM

## 2022-11-03 MED ORDER — ATORVASTATIN CALCIUM 80 MG PO TABS: 80.0000 mg | ORAL_TABLET | Freq: Every day | ORAL | 3 refills | Status: AC

## 2022-11-03 MED ORDER — ASPIRIN 81 MG PO TBEC: 81.0000 mg | DELAYED_RELEASE_TABLET | Freq: Every day | ORAL | 12 refills | Status: DC

## 2022-11-03 NOTE — Patient Instructions (Addendum)
Medication Instructions:  STOP lisinopril, carvedilol, crestor START LIPITOR 80MG  DAILY  START 81MG  ASPIRIN DAILY    *If you need a refill on your cardiac medications before your next appointment, please call your pharmacy*   Lab Work: NONE    If you have labs (blood work) drawn today and your tests are completely normal, you will receive your results only by: MyChart Message (if you have MyChart) OR A paper copy in the mail If you have any lab test that is abnormal or we need to change your treatment, we will call you to review the results.   Testing/Procedures: NONE   Follow-Up: At [facility], you and your health needs are our priority.  As part of our continuing mission to provide you with exceptional heart care, we have created designated Provider Care Teams.  These Care Teams include your primary Cardiologist (physician) and Advanced Practice Providers (APPs -  Physician Assistants and Nurse Practitioners) who all work together to provide you with the care you need, when you need it.  We recommend signing up for the patient portal called "MyChart".  Sign up information is provided on this After Visit Summary.  MyChart is used to connect with patients for Virtual Visits (Telemedicine).  Patients are able to view lab/test results, encounter notes, upcoming appointments, etc.  Non-urgent messages can be sent to your provider as well.   To learn more about what you can do with MyChart, go to [email or web address].    Your next appointment:   4 week(s)  The format for your next appointment:   In Person  Provider:   Edd Fabian, FNP, Micah Flesher, PA-C, Marjie Skiff, PA-C, Robet Leu, PA-C, Juanda Crumble, PA-C, Joni Reining, DNP, ANP, Azalee Course, PA-C, Bernadene Person, NP, or [facility], NP    Then, None will plan to see you again in 6 month(s).   Other Instructions

## 2022-11-24 ENCOUNTER — Telehealth: Payer: Self-pay | Admitting: Cardiovascular Disease

## 2022-11-24 NOTE — Telephone Encounter (Signed)
Pt c/o medication issue:  1. Name of Medication: lisiniopril  2. How are you currently taking this medication (dosage and times per day)?    3. Are you having a reaction (difficulty breathing--STAT)? no  4. What is your medication issue? Patient states the dr is suppose called in prescription for that order. Please advise

## 2022-11-24 NOTE — Telephone Encounter (Signed)
Called and spoke to patient concerning Lisinopril. Patient was informed medication was discontinued at last office visit. Patient verbalized understanding and agree.

## 2022-11-27 NOTE — Progress Notes (Deleted)
  Cardiology Office Note:  .   Date:  11/27/2022  ID:  Brianna [name], DOB July 17, 1960, MRN [id number] PCP: Tamsen Roers, NP  [facility] Providers Cardiologist:  Dr. Flora Lipps   History of Present Illness: .   Brianna [name] is a 62 y.o. female with a past medical history of  CKD stage IV, DM, HTN, HLD, carotid artery disease, anemia. Patient is followed by Dr. Flora Lipps and presents today for a 4 week follow up.   Patient previously underwent carotid doppler studies on 07/26/22 that showed 1-39% stenosis in the right ICA and 40-59% stenosis in the left ICA. She was started on ASA, was already on statin therapy. She underwent nuclear stress test on 08/05/22 that showed no ischemia, EF 36%. Echocardiogram on 08/30/22 showed EF 60-65% no regional wall motion abnormalities, moderate LVH, grade I DD, low normal RV function.   Patient was seen by Dr. Flora Lipps on 11/03/22. At that time, patient reported feeling week. Her BP was 80/60. Carvedilol and lisinopril were held. She was instructed to keep BP log and was scheduled for a 4 week follow up   Hypotension  - When seen in clinic on 10/31, patient was hypotensive with BP 80/60. Carvedilol and lisinopril were held  -   Chronic Anemia  CKD stage IV  -  - Patient has been referred to nephrology for management of anemia and CKD stage 4  Carotid artery disease  - Carotid ultrasounds from 07/2022 showed 1-39% stenosis in the right ICA and 40-59% stenosis in the left ICA - Anticipate repeat dopplers in 07/2023  - Continue ASA 81 mg daily  - Continue lipitor 80 mg daily   SOB  - Likely due to deconditioning, chronic anemia  - Echocardiogram from 08/2022 showed EF 60-65% no regional wall motion abnormalities, moderate LVH, grade I DD, low normal RV function  ROS: ***  Studies Reviewed: .        *** Risk Assessment/Calculations:   {Does this patient have ATRIAL FIBRILLATION?:[id number]} No BP recorded.  {Refresh Note OR  Click here to enter BP  :1}***       Physical Exam:   VS:  There were no vitals taken for this visit.   Wt Readings from Last 3 Encounters:  11/03/22 178 lb (80.7 kg)  07/14/22 177 lb 12.8 oz (80.6 kg)  09/19/21 160 lb 15 oz (73 kg)    GEN: Well nourished, well developed in no acute distress NECK: No JVD; No carotid bruits CARDIAC: ***RRR, no murmurs, rubs, gallops RESPIRATORY:  Clear to auscultation without rales, wheezing or rhonchi  ABDOMEN: Soft, non-tender, non-distended EXTREMITIES:  No edema; No deformity   ASSESSMENT AND PLAN: .   ***    {Are you ordering a CV Procedure (e.g. stress test, cath, DCCV, TEE, etc)?   Press F2        :[id number]}  Dispo: ***  Signed, Jonita Albee, PA-C

## 2022-11-29 ENCOUNTER — Ambulatory Visit: Payer: BC Managed Care – PPO | Attending: Cardiology | Admitting: Cardiology

## 2023-01-13 ENCOUNTER — Ambulatory Visit: Payer: BC Managed Care – PPO | Admitting: Nurse Practitioner

## 2023-01-28 DIAGNOSIS — E1122 Type 2 diabetes mellitus with diabetic chronic kidney disease: Secondary | ICD-10-CM | POA: Insufficient documentation

## 2023-01-28 DIAGNOSIS — Z01818 Encounter for other preprocedural examination: Secondary | ICD-10-CM | POA: Insufficient documentation

## 2023-01-30 ENCOUNTER — Ambulatory Visit: Payer: BC Managed Care – PPO | Admitting: Nurse Practitioner

## 2023-01-31 ENCOUNTER — Encounter: Payer: Self-pay | Admitting: Nurse Practitioner

## 2023-02-13 ENCOUNTER — Ambulatory Visit: Payer: BC Managed Care – PPO | Admitting: Nurse Practitioner

## 2023-02-13 NOTE — Progress Notes (Deleted)
 Cardiology Office Note:    Date:  02/13/2023  ID:  Brianna [name], DOB 1960/07/22, MRN [id number] PCP: Tamsen Roers, NP  [facility] Providers Cardiologist:  None { Click to update primary MD,subspecialty MD or APP then REFRESH:1}    {Click to Open Review  :1}   Patient Profile:      Brianna [name] is a 63 y.o. female with visit-pertinent history of CKD stage IV, T2DM, hypertension, hyperlipidemia, carotid artery disease, aortic atherosclerosis  She established care with cardiology service on 07/14/2022 for evaluation of SOB.  Carotid ultrasound 07/26/2022 showed right ICA consistent with 1-39% stenosis and left ICA consistent with 40-59% stenosis.  NM stress test on 08/05/2022 showed normal perfusion, moderate systolic dysfunction EF 36%, intermediate risk due to systolic dysfunction.  TTE 08/29/2022 shows LVEF 60 to 65%, no RWMA, moderate LVH, grade 1 DD, RV function and size normal, trivial MR.    Last seen in clinic on 11/03/2022 where she had recently noted to feeling weak and had concerns about low blood pressure.  She also reported being short of breath which she attributed to her lack of exercise.  Her blood pressure was 80/60, she denied dizziness or syncope.  Her carvedilol and lisinopril were discontinued.  She was referred to a nephrologist for management of CKD stage IV and anemia.  She established care with [facility] on 11/08/2022 and referred for transplant.      History of Present Illness:  Discussed the use of AI scribe software for clinical note transcription with the patient, who gave verbal consent to proceed.  Brianna [name] is a 63 y.o. female who returns for ***Discussed the use of AI scribe software for clinical note transcription with the patient, who gave verbal consent to proceed.  History of Present Illness            ROS   See HPI ***     Home Medications:    Prior to Admission medications   Medication Sig  Start Date End Date Taking? Authorizing Provider  acetaminophen (TYLENOL) 500 MG tablet Take 500 mg by mouth every 6 (six) hours as needed for moderate pain.    [provider]  albuterol (VENTOLIN HFA) 108 (90 Base) MCG/ACT inhaler INHALE 1 PUFF BY MOUTH EVERY 4 HOURS AS NEEDED 04/22/19   [provider]  amitriptyline (ELAVIL) 50 MG tablet TAKE 1 TABLET BY MOUTH AT BEDTIME 07/30/18   Tiffany Kocher, PA-C  ascorbic acid (VITAMIN C) 500 MG tablet Take 500 mg by mouth daily.    [provider]  aspirin EC 81 MG tablet Take 1 tablet (81 mg total) by mouth daily. Swallow whole. 11/03/22   O'NealRonnald Ramp, MD  atorvastatin (LIPITOR) 80 MG tablet Take 1 tablet (80 mg total) by mouth daily. 11/03/22   O'NealRonnald Ramp, MD  Blood Glucose Monitoring Suppl [facility] VERIO) w/Device KIT 1 each by Does not apply route as needed. 08/20/19   Roma Kayser, MD  cholecalciferol (VITAMIN D3) 25 MCG (1000 UT) tablet Take 2 tablets (2,000 Units total) by mouth daily. 12/19/18   Fields, Darleene Cleaver, MD  ferrous sulfate 325 (65 FE) MG tablet Take 325 mg by mouth 3 (three) times daily with meals.    [provider]  glucose blood (ONETOUCH VERIO) test strip Use as instructed 08/20/19   Roma Kayser, MD  glucose blood test strip One touch verio glucose test strip.  Use as instructed 03/21/20   Pauline Aus, PA-C  Insulin Aspart (NOVOLOG [place]) Inject 10-16 Units into the skin See admin instructions.    [provider]  Insulin Degludec (TRESIBA) 100 UNIT/ML SOLN Inject 70 Units into the skin at bedtime. Take 50 units at bedtime    [provider]  polyethylene glycol (MIRALAX / GLYCOLAX) packet Take 17 g by mouth daily as needed for mild constipation.     [provider]  Zinc 100 MG TABS Take 1 tablet by mouth daily.    [provider]   Studies Reviewed:       *** Risk Assessment/Calculations:   {Does this patient have  ATRIAL FIBRILLATION?:[id number]} No BP recorded.  {Refresh Note OR Click here to enter BP  :1}***       Physical Exam:   VS:  There were no vitals taken for this visit.   Wt Readings from Last 3 Encounters:  11/03/22 178 lb (80.7 kg)  07/14/22 177 lb 12.8 oz (80.6 kg)  09/19/21 160 lb 15 oz (73 kg)    Physical Exam***     Assessment and Plan:  Hypotension  Carvedilol 6.25 mg twice daily and lisinopril 20 mg daily discontinued during last OV on 10/2022 due to BP 81/57  CKD stage IV / Anemia Creatinine 2.7, GFR 20 on 01/2023 -Managed by [facility] -Currently awaiting kidney transplant status  Carotid artery disease Carotid ultrasound 07/2022 with right ICA 1 to 39% stenosis and left ICA consistent with 40-59% stenosis -Recommend repeat carotid ultrasound in 07/2023  Hyperlipidemia / Aortic atherosclerosis Aortic atherosclerosis noted on CT ABD 11/2016.  NM stress test 08/2022 with normal perfusion. During last OV Crestor 20 mg was discontinued and she was started on Lipitor 80 mg as well as aspirin 81 mg LDL 212 on 06/2019  Diabetes A1c 7.5 on 01/2023              {Are you ordering a CV Procedure (e.g. stress test, cath, DCCV, TEE, etc)?   Press F2        :[id number]}  Dispo:  No follow-ups on file.  Signed, Denyce Robert, NP

## 2023-02-23 ENCOUNTER — Encounter: Payer: Self-pay | Admitting: Nurse Practitioner

## 2023-02-23 ENCOUNTER — Ambulatory Visit: Payer: BC Managed Care – PPO | Admitting: Nurse Practitioner

## 2023-02-23 ENCOUNTER — Ambulatory Visit: Payer: BC Managed Care – PPO | Attending: Nurse Practitioner | Admitting: Nurse Practitioner

## 2023-02-23 VITALS — BP 140/80 | HR 102 | Ht 66.0 in | Wt 184.0 lb

## 2023-02-23 DIAGNOSIS — I6523 Occlusion and stenosis of bilateral carotid arteries: Secondary | ICD-10-CM | POA: Diagnosis not present

## 2023-02-23 DIAGNOSIS — R0602 Shortness of breath: Secondary | ICD-10-CM

## 2023-02-23 DIAGNOSIS — I700 Atherosclerosis of aorta: Secondary | ICD-10-CM

## 2023-02-23 DIAGNOSIS — E785 Hyperlipidemia, unspecified: Secondary | ICD-10-CM | POA: Diagnosis not present

## 2023-02-23 DIAGNOSIS — E1165 Type 2 diabetes mellitus with hyperglycemia: Secondary | ICD-10-CM

## 2023-02-23 DIAGNOSIS — I10 Essential (primary) hypertension: Secondary | ICD-10-CM | POA: Diagnosis not present

## 2023-02-23 DIAGNOSIS — D649 Anemia, unspecified: Secondary | ICD-10-CM

## 2023-02-23 DIAGNOSIS — N184 Chronic kidney disease, stage 4 (severe): Secondary | ICD-10-CM

## 2023-02-23 MED ORDER — CARVEDILOL 6.25 MG PO TABS: 6.2500 mg | ORAL_TABLET | Freq: Two times a day (BID) | ORAL | 3 refills | Status: DC

## 2023-02-23 NOTE — Patient Instructions (Signed)
Medication Instructions:  Start Carvedilol 6.25 mg twice daily  *If you need a refill on your cardiac medications before your next appointment, please call your pharmacy*   Lab Work: NONE ordered at this time of appointment   Testing/Procedures: NONE ordered at this time of appointment   Follow-Up: At [facility], you and your health needs are our priority.  As part of our continuing mission to provide you with exceptional heart care, we have created designated Provider Care Teams.  These Care Teams include your primary Cardiologist (physician) and Advanced Practice Providers (APPs -  Physician Assistants and Nurse Practitioners) who all work together to provide you with the care you need, when you need it.  We recommend signing up for the patient portal called "MyChart".  Sign up information is provided on this After Visit Summary.  MyChart is used to connect with patients for Virtual Visits (Telemedicine).  Patients are able to view lab/test results, encounter notes, upcoming appointments, etc.  Non-urgent messages can be sent to your provider as well.   To learn more about what you can do with MyChart, go to [email or web address].    Your next appointment:   3 month(s)  Provider:   Bernadene Person, NP        Other Instructions Monitor Blood pressure. Report blood pressure consistently less than 130/80, heart rate less than 50 or greater than 100, systolic BP (top number) consistently less than 110.

## 2023-02-23 NOTE — Progress Notes (Signed)
Office Visit    Patient Name: Brianna [name] Date of Encounter: 02/23/2023  Primary Care Provider:  Tamsen Roers, NP Primary Cardiologist:  Reatha Harps, MD  Chief Complaint    63 year old female a history of  carotid artery disease, aortic atherosclerosis noted on prior CT, hypertension, hyperlipidemia, type 2 diabetes, CKD stage IV, and anemia who presents for follow-up related to hypertension/hypotension.   Past Medical History    Past Medical History:  Diagnosis Date   Diabetes mellitus (HCC)    Gastritis    GERD without esophagitis    Past Surgical History:  Procedure Laterality Date   67 HOUR PH STUDY  01/10/2018   Procedure: 24 HOUR PH STUDY;  Surgeon: Napoleon Form, MD;  Location: [facility];  Service: Endoscopy;;   CARPAL TUNNEL RELEASE     left   COLONOSCOPY WITH ESOPHAGOGASTRODUODENOSCOPY (EGD) N/A 04/02/2012   SLF: SMALL RECTAL polyp/Small internal hemorrhoids. Polyp hyperplastic. On EGD she had mild gastritis. Biopsy showed chronic and active gastritis and no H. pylori. Next colonoscopy in 10 years with overtube/? Propofol   ESOPHAGEAL DILATION  12/09/2016   Procedure: ESOPHAGEAL DILATION;  Surgeon: West Bali, MD;  Location: [facility];  Service: Endoscopy;;   ESOPHAGEAL MANOMETRY N/A 01/10/2018   Procedure: ESOPHAGEAL MANOMETRY (EM);  Surgeon: Napoleon Form, MD;  Location: [facility];  Service: Endoscopy;  Laterality: N/A;   ESOPHAGOGASTRODUODENOSCOPY  03/2012   SLF: gastritis   ESOPHAGOGASTRODUODENOSCOPY N/A 12/09/2016   Procedure: ESOPHAGOGASTRODUODENOSCOPY (EGD);  Surgeon: West Bali, MD;  [facility];  Service: Endoscopy;  Laterality: N/A;  11:30am   ESOPHAGOGASTRODUODENOSCOPY N/A 08/22/2017   Procedure: ESOPHAGOGASTRODUODENOSCOPY (EGD);  Surgeon: West Bali, MD;  [facility];  Service: Endoscopy;  Laterality: N/A;  11:30am   PARTIAL HYSTERECTOMY     PH IMPEDANCE STUDY N/A  01/10/2018   Procedure: PH IMPEDANCE STUDY;  Surgeon: Napoleon Form, MD;  Location: [facility];  Service: Endoscopy;  Laterality: N/A;   SAVORY DILATION N/A 08/22/2017   Procedure: SAVORY DILATION;  Surgeon: West Bali, MD;  [facility]: [facility];  Service: Endoscopy;  Laterality: N/A;   TRIGGER FINGER RELEASE Left     Allergies  Allergies  Allergen Reactions   Other Hives and Shortness Of Breath    seafood   Bacitracin    Iodine Hives    shellfish   Tramadol    Neosporin [Neomycin-Bacitracin Zn-Polymyx] Rash     Labs/Other Studies Reviewed    The following studies were reviewed today:  Cardiac Studies & Procedures   ______________________________________________________________________________________________   STRESS TESTS  MYOCARDIAL PERFUSION IMAGING 08/05/2022  Narrative   Findings are consistent with no ischemia. The study is intermediate risk.   ST depression in the inferior leads (II, III, aVF) and V6 was noted at baseline, unchanged with stress   LV perfusion is normal. There is no evidence of ischemia. There is no evidence of infarction.   Left ventricular function is abnormal. Global function is moderately reduced. Nuclear stress EF: 36%. The left ventricular ejection fraction is moderately decreased (30-44%). End diastolic cavity size is mildly enlarged. End systolic cavity size is mildly enlarged.   Prior study not available for comparison.  Normal perfusion Moderate systolic dysfunction, EF 36% Intermediate risk study due to systolic dysfunction   ECHOCARDIOGRAM  ECHOCARDIOGRAM COMPLETE 08/30/2022  Narrative ECHOCARDIOGRAM REPORT    Patient Name:   Brianna [name] Date of Exam: 08/29/2022 Medical Rec #:  [id number]  Height:       68.0 in Accession #:    [id number]            Weight:       177.8 lb Date of Birth:  [birth year]             BSA:          1.944 m Patient Age:    62 years              BP:           98/81  mmHg Patient Gender: F                     HR:           100 bpm. Exam Location:  [address]  Procedure: 2D Echo, 3D Echo, Cardiac Doppler, Color Doppler and Strain Analysis  Indications:    R06.02 Dyspnea  History:        Patient has no prior history of Echocardiogram examinations. Signs/Symptoms:Shortness of Breath; Risk Factors:Diabetes, Dyslipidemia and Hypertension.  Sonographer:    Chanetta Marshall BA, RDCS Referring Phys: Ronnald Ramp O'NEAL  IMPRESSIONS   1. Left ventricular ejection fraction, by estimation, is 60 to 65%. Left ventricular ejection fraction by 3D volume is 64 %. The left ventricle has normal function. The left ventricle has no regional wall motion abnormalities. There is moderate left ventricular hypertrophy. Left ventricular diastolic parameters are consistent with Grade I diastolic dysfunction (impaired relaxation). Elevated left ventricular end-diastolic pressure. The E/e' is 22. The average left ventricular global longitudinal strain is -11.8 %. The global longitudinal strain is abnormal. 2. Right ventricular systolic function is low normal. The right ventricular size is normal. Tricuspid regurgitation signal is inadequate for assessing PA pressure. 3. The mitral valve is degenerative. Trivial mitral valve regurgitation. 4. The aortic valve is tricuspid. Aortic valve regurgitation is not visualized. No aortic stenosis is present.  Comparison(s): No prior Echocardiogram.  FINDINGS Left Ventricle: Left ventricular ejection fraction, by estimation, is 60 to 65%. Left ventricular ejection fraction by 3D volume is 64 %. The left ventricle has normal function. The left ventricle has no regional wall motion abnormalities. The average left ventricular global longitudinal strain is -11.8 %. The global longitudinal strain is abnormal. The left ventricular internal cavity size was normal in size. There is moderate left ventricular hypertrophy. Left ventricular  diastolic parameters are consistent with Grade I diastolic dysfunction (impaired relaxation). Elevated left ventricular end-diastolic pressure. The E/e' is 22.  Right Ventricle: The right ventricular size is normal. No increase in right ventricular wall thickness. Right ventricular systolic function is low normal. Tricuspid regurgitation signal is inadequate for assessing PA pressure.  Left Atrium: Left atrial size was normal in size.  Right Atrium: Right atrial size was normal in size.  Pericardium: Trivial pericardial effusion is present. The pericardial effusion is anterior to the right ventricle and surrounding the apex.  Mitral Valve: The mitral valve is degenerative in appearance. There is mild thickening of the posterior and anterior mitral valve leaflet(s). There is mild calcification of the posterior mitral valve leaflet(s). Mild to moderate mitral annular calcification. Trivial mitral valve regurgitation.  Tricuspid Valve: The tricuspid valve is grossly normal. Tricuspid valve regurgitation is trivial.  Aortic Valve: The aortic valve is tricuspid. Aortic valve regurgitation is not visualized. No aortic stenosis is present.  Pulmonic Valve: The pulmonic valve was grossly normal. Pulmonic valve regurgitation is trivial.  Aorta: The aortic root and ascending aorta  are structurally normal, with no evidence of dilitation.  IAS/Shunts: No atrial level shunt detected by color flow Doppler.   LEFT VENTRICLE PLAX 2D LVIDd:         3.30 cm         Diastology LVIDs:         3.10 cm         LV e' medial:    3.05 cm/s LV PW:         1.60 cm         LV E/e' medial:  24.6 LV IVS:        1.20 cm         LV e' lateral:   4.13 cm/s LVOT diam:     2.10 cm         LV E/e' lateral: 18.1 LV SV:         56 LV SV Index:   29              2D LVOT Area:     3.46 cm        Longitudinal Strain 2D Strain GLS  -11.3 % (A2C): 2D Strain GLS  -12.1 % (A3C): 2D Strain GLS  -12.0 % (A4C): 2D Strain  GLS  -11.8 % Avg:  3D Volume EF LV 3D EF:    Left ventricul ar ejection fraction by 3D volume is 64 %.  3D Volume EF: 3D EF:        64 % LV EDV:       107 ml LV ESV:       38 ml LV SV:        68 ml  RIGHT VENTRICLE RV Basal diam:  2.40 cm RV Mid diam:    2.60 cm RV S prime:     10.70 cm/s TAPSE (M-mode): 2.1 cm  LEFT ATRIUM             Index        RIGHT ATRIUM          Index LA diam:        3.50 cm 1.80 cm/m   RA Area:     7.82 cm LA Vol (A2C):   43.9 ml 22.56 ml/m  RA Volume:   12.80 ml 6.58 ml/m LA Vol (A4C):   33.1 ml 17.03 ml/m LA Biplane Vol: 42.8 ml 22.02 ml/m AORTIC VALVE LVOT Vmax:   94.30 cm/s LVOT Vmean:  64.200 cm/s LVOT VTI:    0.162 m  AORTA Ao Root diam: 3.30 cm Ao Asc diam:  3.10 cm  MITRAL VALVE MV Area (PHT): 4.96 cm     SHUNTS MV Decel Time: 153 msec     Systemic VTI:  0.16 m MV E velocity: 74.90 cm/s   Systemic Diam: 2.10 cm MV A velocity: 119.00 cm/s MV E/A ratio:  0.63  Zoila Shutter MD Electronically signed by Zoila Shutter MD Signature Date/Time: 08/29/2022/4:35:53 PM    Final          ______________________________________________________________________________________________     Recent Labs: No results found for requested labs within last 365 days.  Recent Lipid Panel    Component Value Date/Time   CHOL 289 (A) 06/25/2019 0000   TRIG 205 (A) 06/25/2019 0000   HDL 36 06/25/2019 0000   LDLCALC 212 06/25/2019 0000    History of Present Illness   63 year old female with the above past medical history including  carotid artery disease, aortic atherosclerosis noted on prior CT, hypertension, hyperlipidemia,  type 2 diabetes, CKD stage IV, and anemia.   She established care with cardiology service in July 2024 for the evaluation of SOB.  Carotid ultrasound 07/26/2022 showed right ICA consistent with 1-39% stenosis and left ICA consistent with 40-59% stenosis.  Nuclear stress test in 08/2022 showed normal perfusion,  moderate systolic dysfunction EF 36%, intermediate risk due to systolic dysfunction. Follow-up echocardiogram in 08/2022 showed LVEF 60 to 65%, no RWMA, moderate LVH, grade 1 DD, RV function and size normal, trivial MR. Given reassuring cardiac workup, it was noted that chronic anemia and generally physical deconditioning were possibly contributing to her shortness of breath. She was last seen in the office on 11/03/2022 and noted generalized weakness, fatigue, and hypotension, blood pressure was 80/60.  Carvedilol and lisinopril were discontinued.  She follows with [facility] and is pending possible renal transplant.   She presents today for follow-up accompanied by her significant other. Since her last visit she has been stable from a cardiac standpoint.  She denies any chest pain, dyspnea, palpitations, dizziness, edema, PND, orthopnea, weight gain.  BP and HR have been elevated recently.  Otherwise, she reports feeling well.  Home Medications    Current Outpatient Medications  Medication Sig Dispense Refill   acetaminophen (TYLENOL) 500 MG tablet Take 500 mg by mouth every 6 (six) hours as needed for moderate pain.     albuterol (VENTOLIN HFA) 108 (90 Base) MCG/ACT inhaler INHALE 1 PUFF BY MOUTH EVERY 4 HOURS AS NEEDED     Albuterol Sulfate, sensor, (PROAIR DIGIHALER) 108 (90 Base) MCG/ACT AEPB inhale 2 puff by inhalation route  every 4 - 6 hours as needed     amitriptyline (ELAVIL) 50 MG tablet TAKE 1 TABLET BY MOUTH AT BEDTIME 30 tablet 5   ammonium lactate (AMLACTIN) 12 % cream APPLY CREAM TOPICALLY TO AFFECTED AREA ONCE DAILY     ascorbic acid (VITAMIN C) 500 MG tablet Take 500 mg by mouth daily.     aspirin EC 81 MG tablet Take 1 tablet (81 mg total) by mouth daily. Swallow whole. 30 tablet 12   atorvastatin (LIPITOR) 80 MG tablet Take 1 tablet (80 mg total) by mouth daily. 90 tablet 3   Blood Glucose Monitoring Suppl (ONETOUCH VERIO) w/Device KIT 1 each by Does not apply route as  needed. 1 kit 0   Cholecalciferol 125 MCG (5000 UT) capsule 5000 units by oral route.     collagenase (SANTYL) 250 UNIT/GM ointment      ferrous sulfate 325 (65 FE) MG tablet Take 325 mg by mouth 3 (three) times daily with meals.     glucose blood (ONETOUCH VERIO) test strip Use as instructed 100 each 12   glucose blood test strip One touch verio glucose test strip.  Use as instructed 100 each 0   hydrOXYzine (ATARAX) 25 MG tablet Take 1 tablet by mouth daily as needed.     Insulin Aspart (NOVOLOG [place]) Inject 10-16 Units into the skin See admin instructions.     Insulin Degludec (TRESIBA) 100 UNIT/ML SOLN Inject 70 Units into the skin at bedtime. Take 50 units at bedtime     levothyroxine (SYNTHROID) 75 MCG tablet Take 75 mcg by mouth daily.     linaclotide (LINZESS) 145 MCG CAPS capsule Take 1 capsule by mouth daily.     olopatadine (PATANOL) 0.1 % ophthalmic solution INSTILL 1 DROP INTO AFFECTED EYE(S) TWICE DAILY     omeprazole (PRILOSEC) 40 MG capsule Take 1 capsule by mouth daily.  ondansetron (ZOFRAN-ODT) 4 MG disintegrating tablet DISSOLVE 1 TABLET IN MOUTH EVERY 8 HOURS AS NEEDED FOR NAUSEA.     pantoprazole (PROTONIX) 40 MG tablet 40 mg by oral route.     polyethylene glycol (GOLYTELY) 236 g solution USE AS DIRECTED FOR COLON PREP     polyethylene glycol (MIRALAX / GLYCOLAX) packet Take 17 g by mouth daily as needed for mild constipation.      pregabalin (LYRICA) 75 MG capsule Take 75 mg by mouth 2 (two) times daily.     rosuvastatin (CRESTOR) 40 MG tablet Take 1 tablet by mouth daily.     silver sulfADIAZINE (SSD) 1 % cream      SOLIQUA 100-33 UNT-MCG/ML SOPN INJECT 35-45 UNITS SUBCUTANEOUSLY ONCE DAILY TITRATING DOSE     triamcinolone cream (KENALOG) 0.1 % APPLY CREAM EXTERNALLY TO AFFECTED AREA TWICE A WEEK     Zinc 100 MG TABS Take 1 tablet by mouth daily.     cholecalciferol (VITAMIN D3) 25 MCG (1000 UT) tablet Take 2 tablets (2,000 Units total) by mouth daily. (Patient not  taking: Reported on 02/23/2023) 30 tablet 11   No current facility-administered medications for this visit.     Review of Systems    She denies chest pain, palpitations, dyspnea, pnd, orthopnea, n, v, dizziness, syncope, edema, weight gain, or early satiety. All other systems reviewed and are otherwise negative except as noted above.   Physical Exam    VS:  BP (!) 140/80   Pulse (!) 102   Ht 5\' 6"  (1.676 m)   Wt 184 lb (83.5 kg)   SpO2 100%   BMI 29.70 kg/m  GEN: Well nourished, well developed, in no acute distress. HEENT: normal. Neck: Supple, no JVD, carotid bruits, or masses. Cardiac: RRR, no murmurs, rubs, or gallops. No clubbing, cyanosis, edema.  Radials/DP/PT 2+ and equal bilaterally.  Respiratory:  Respirations regular and unlabored, clear to auscultation bilaterally. GI: Soft, nontender, nondistended, BS + x 4. MS: no deformity or atrophy. Skin: warm and dry, no rash. Neuro:  Strength and sensation are intact. Psych: Normal affect.  Accessory Clinical Findings    ECG personally reviewed by me today - EKG Interpretation Date/Time:  Thursday February 23 2023 10:55:48 EST Ventricular Rate:  102 PR Interval:  142 QRS Duration:  94 QT Interval:  352 QTC Calculation: 458 R Axis:   20  Text Interpretation: Sinus tachycardia Left ventricular hypertrophy with repolarization abnormality ( Sokolow-Lyon ) Cannot rule out Septal infarct (cited on or before 14-Jul-2022) When compared with ECG of 14-Jul-2022 08:45, No significant change was found Confirmed by Bernadene Person ([id number]) on 02/23/2023 11:04:45 AM  - no acute changes.   Lab Results  Component Value Date   WBC 6.5 03/21/2020   HGB 9.7 (L) 03/21/2020   HCT 30.0 (L) 03/21/2020   MCV 85.7 03/21/2020   PLT 303 03/21/2020   Lab Results  Component Value Date   CREATININE 1.40 (H) 03/21/2020   BUN 28 (H) 03/21/2020   NA 132 (L) 03/21/2020   K 4.2 03/21/2020   CL 100 03/21/2020   CO2 24 03/21/2020   Lab Results   Component Value Date   ALT 16 02/01/2018   AST 15 02/01/2018   ALKPHOS 115 11/14/2016   BILITOT 0.3 02/01/2018   Lab Results  Component Value Date   CHOL 289 (A) 06/25/2019   HDL 36 06/25/2019   LDLCALC 212 06/25/2019   TRIG 205 (A) 06/25/2019    Lab Results  Component Value  Date   HGBA1C 12.4 06/25/2019    Assessment & Plan    1. Hypertension/tachycardia: Carvedilol and lisinopril were recently discontinued in the setting of hypotension.  BP is elevated in office today, HR is also elevated.  She is asymptomatic.  Through shared decision making, will reintroduce carvedilol 6.25 mg twice daily.  Continue to monitor BP and report BP consistent greater than 130/80, SBP consistently less than 110 mm Hg. I also advised her to monitor heart rate and report HR consistently less than 50 bpm, or consistently greater than 100 bpm.  2. Shortness of breath: Nuclear stress test in 08/2022 showed normal perfusion, moderate systolic dysfunction EF 36%, intermediate risk due to systolic dysfunction. Follow-up echocardiogram in 08/2022 showed LVEF 60 to 65%, no RWMA, moderate LVH, grade 1 DD, RV function and size normal, trivial MR. Given reassuring cardiac workup, suspect chronic anemia and generally physical deconditioning could be contributing to shortness of breath.  Notes that recently her shortness of breath has improved, she denies any symptoms concerning for angina.  3. Carotid artery stenosis: Carotid ultrasound 07/26/2022 showed right ICA consistent with 1-39% stenosis and left ICA consistent with 40-59% stenosis. Asymptomatic. Continue ASA, Lipitor.   4. Aortic atherosclerosis: Noted on prior CT. Continue Lipitor.   5. Hyperlipidemia: No recent LDL on file.  Will plan for fasting lipids at follow-up visit (she is not fasting today).  Continue Lipitor.  6. Type 2 diabetes: A1c was 7.5 in 01/2023.  She is working on lifestyle modifications with diet and exercise.  Consider repeat A1c at follow-up  if not done with PCP.  7. CKD stage IV: Creatine was 2.7 in 01/2023. Following with [facility], pending possible renal transplant.   8. Anemia: Hemoglobin was stable at 10.0 in 11/2022.  9. Disposition: Follow-up in 3 months, sooner if needed.   Joylene Grapes, NP 02/23/2023, 11:12 AM

## 2023-03-14 ENCOUNTER — Ambulatory Visit: Payer: BC Managed Care – PPO | Admitting: Nurse Practitioner

## 2023-05-23 ENCOUNTER — Ambulatory Visit: Payer: BC Managed Care – PPO | Admitting: Nurse Practitioner

## 2023-06-30 ENCOUNTER — Encounter: Payer: Self-pay | Admitting: Nurse Practitioner

## 2023-06-30 ENCOUNTER — Ambulatory Visit: Attending: Nurse Practitioner | Admitting: Nurse Practitioner

## 2023-06-30 VITALS — BP 128/90 | HR 82 | Ht 66.0 in | Wt 186.2 lb

## 2023-06-30 DIAGNOSIS — I700 Atherosclerosis of aorta: Secondary | ICD-10-CM

## 2023-06-30 DIAGNOSIS — N184 Chronic kidney disease, stage 4 (severe): Secondary | ICD-10-CM

## 2023-06-30 DIAGNOSIS — R0602 Shortness of breath: Secondary | ICD-10-CM

## 2023-06-30 DIAGNOSIS — I6523 Occlusion and stenosis of bilateral carotid arteries: Secondary | ICD-10-CM

## 2023-06-30 DIAGNOSIS — D649 Anemia, unspecified: Secondary | ICD-10-CM

## 2023-06-30 DIAGNOSIS — I10 Essential (primary) hypertension: Secondary | ICD-10-CM | POA: Diagnosis not present

## 2023-06-30 LAB — CBC

## 2023-06-30 MED ORDER — CARVEDILOL 3.125 MG PO TABS: 3.1250 mg | ORAL_TABLET | Freq: Two times a day (BID) | ORAL | 3 refills | Status: AC

## 2023-06-30 NOTE — Patient Instructions (Addendum)
 Medication Instructions:  Stop Carvedilol  6.25 mg  Decrease Carvedilol  3.125 mg twice daily   *If you need a refill on your cardiac medications before your next appointment, please call your pharmacy*  Lab Work: CBC,CMET, Lipid, A1C, TSH  Testing/Procedures: NONE ordered at this time of appointment    Follow-Up: At [facility], you and your health needs are our priority.  As part of our continuing mission to provide you with exceptional heart care, our providers are all part of one team.  This team includes your primary Cardiologist (physician) and Advanced Practice Providers or APPs (Physician Assistants and Nurse Practitioners) who all work together to provide you with the care you need, when you need it.  Your next appointment:   2 month(s)  Provider:   Darryle ONEIDA Decent, MD or Damien Braver, NP          We recommend signing up for the patient portal called MyChart.  Sign up information is provided on this After Visit Summary.  MyChart is used to connect with patients for Virtual Visits (Telemedicine).  Patients are able to view lab/test results, encounter notes, upcoming appointments, etc.  Non-urgent messages can be sent to your provider as well.   To learn more about what you can do with MyChart, go to [email or web address].

## 2023-06-30 NOTE — Progress Notes (Unsigned)
 Office Visit    Patient Name: Brianna [name] Date of Encounter: 06/30/2023  Primary Care Provider:  Kristine Corean Deed, NP Primary Cardiologist:  Darryle ONEIDA Decent, MD  Chief Complaint    63 year old female a history of  carotid artery disease, aortic atherosclerosis noted on prior CT, hypertension, hyperlipidemia, type 2 diabetes, CKD stage IV, and anemia who presents for follow-up related to hypertension/hypotension.   Past Medical History    Past Medical History:  Diagnosis Date   Diabetes mellitus (HCC)    Gastritis    GERD without esophagitis    Past Surgical History:  Procedure Laterality Date   78 HOUR PH STUDY  01/10/2018   Procedure: 24 HOUR PH STUDY;  Surgeon: Shila Gustav GAILS, MD;  Location: [facility];  Service: Endoscopy;;   CARPAL TUNNEL RELEASE     left   COLONOSCOPY WITH ESOPHAGOGASTRODUODENOSCOPY (EGD) N/A 04/02/2012   SLF: SMALL RECTAL polyp/Small internal hemorrhoids. Polyp hyperplastic. On EGD she had mild gastritis. Biopsy showed chronic and active gastritis and no H. pylori. Next colonoscopy in 10 years with overtube/? Propofol   ESOPHAGEAL DILATION  12/09/2016   Procedure: ESOPHAGEAL DILATION;  Surgeon: Harvey Margo CROME, MD;  Location: [facility];  Service: Endoscopy;;   ESOPHAGEAL MANOMETRY N/A 01/10/2018   Procedure: ESOPHAGEAL MANOMETRY (EM);  Surgeon: Shila Gustav GAILS, MD;  Location: [facility];  Service: Endoscopy;  Laterality: N/A;   ESOPHAGOGASTRODUODENOSCOPY  03/2012   SLF: gastritis   ESOPHAGOGASTRODUODENOSCOPY N/A 12/09/2016   Procedure: ESOPHAGOGASTRODUODENOSCOPY (EGD);  Surgeon: Harvey Margo CROME, MD;  [facility];  Service: Endoscopy;  Laterality: N/A;  11:30am   ESOPHAGOGASTRODUODENOSCOPY N/A 08/22/2017   Procedure: ESOPHAGOGASTRODUODENOSCOPY (EGD);  Surgeon: Harvey Margo CROME, MD;  [facility];  Service: Endoscopy;  Laterality: N/A;  11:30am   PARTIAL HYSTERECTOMY     PH IMPEDANCE STUDY N/A  01/10/2018   Procedure: PH IMPEDANCE STUDY;  Surgeon: Shila Gustav GAILS, MD;  Location: [facility];  Service: Endoscopy;  Laterality: N/A;   SAVORY DILATION N/A 08/22/2017   Procedure: SAVORY DILATION;  Surgeon: Harvey Margo CROME, MD;  [facility]: [facility];  Service: Endoscopy;  Laterality: N/A;   TRIGGER FINGER RELEASE Left     Allergies  Allergies  Allergen Reactions   Other Hives and Shortness Of Breath    seafood   Bacitracin    Iodine Hives    shellfish   Tramadol     Neosporin [Neomycin-Bacitracin Zn-Polymyx] Rash     Labs/Other Studies Reviewed    The following studies were reviewed today:  Cardiac Studies & Procedures   ______________________________________________________________________________________________   STRESS TESTS  MYOCARDIAL PERFUSION IMAGING 08/05/2022  Narrative   Findings are consistent with no ischemia. The study is intermediate risk.   ST depression in the inferior leads (II, III, aVF) and V6 was noted at baseline, unchanged with stress   LV perfusion is normal. There is no evidence of ischemia. There is no evidence of infarction.   Left ventricular function is abnormal. Global function is moderately reduced. Nuclear stress EF: 36%. The left ventricular ejection fraction is moderately decreased (30-44%). End diastolic cavity size is mildly enlarged. End systolic cavity size is mildly enlarged.   Prior study not available for comparison.  Normal perfusion Moderate systolic dysfunction, EF 36% Intermediate risk study due to systolic dysfunction   ECHOCARDIOGRAM  ECHOCARDIOGRAM COMPLETE 08/30/2022  Narrative ECHOCARDIOGRAM REPORT    Patient Name:   Brianna [name] Date of Exam: 08/29/2022 Medical Rec #:  [id number]  Height:       68.0 in Accession #:    [id number]            Weight:       177.8 lb Date of Birth:  May 04, 1960             BSA:          1.944 m Patient Age:    62 years              BP:           98/81  mmHg Patient Gender: F                     HR:           100 bpm. Exam Location:  [address]  Procedure: 2D Echo, 3D Echo, Cardiac Doppler, Color Doppler and Strain Analysis  Indications:    R06.02 Dyspnea  History:        Patient has no prior history of Echocardiogram examinations. Signs/Symptoms:Shortness of Breath; Risk Factors:Diabetes, Dyslipidemia and Hypertension.  Sonographer:    Nolon Berg BA, RDCS Referring Phys: DARRYLE NED O'NEAL  IMPRESSIONS   1. Left ventricular ejection fraction, by estimation, is 60 to 65%. Left ventricular ejection fraction by 3D volume is 64 %. The left ventricle has normal function. The left ventricle has no regional wall motion abnormalities. There is moderate left ventricular hypertrophy. Left ventricular diastolic parameters are consistent with Grade I diastolic dysfunction (impaired relaxation). Elevated left ventricular end-diastolic pressure. The E/e' is 22. The average left ventricular global longitudinal strain is -11.8 %. The global longitudinal strain is abnormal. 2. Right ventricular systolic function is low normal. The right ventricular size is normal. Tricuspid regurgitation signal is inadequate for assessing PA pressure. 3. The mitral valve is degenerative. Trivial mitral valve regurgitation. 4. The aortic valve is tricuspid. Aortic valve regurgitation is not visualized. No aortic stenosis is present.  Comparison(s): No prior Echocardiogram.  FINDINGS Left Ventricle: Left ventricular ejection fraction, by estimation, is 60 to 65%. Left ventricular ejection fraction by 3D volume is 64 %. The left ventricle has normal function. The left ventricle has no regional wall motion abnormalities. The average left ventricular global longitudinal strain is -11.8 %. The global longitudinal strain is abnormal. The left ventricular internal cavity size was normal in size. There is moderate left ventricular hypertrophy. Left ventricular  diastolic parameters are consistent with Grade I diastolic dysfunction (impaired relaxation). Elevated left ventricular end-diastolic pressure. The E/e' is 22.  Right Ventricle: The right ventricular size is normal. No increase in right ventricular wall thickness. Right ventricular systolic function is low normal. Tricuspid regurgitation signal is inadequate for assessing PA pressure.  Left Atrium: Left atrial size was normal in size.  Right Atrium: Right atrial size was normal in size.  Pericardium: Trivial pericardial effusion is present. The pericardial effusion is anterior to the right ventricle and surrounding the apex.  Mitral Valve: The mitral valve is degenerative in appearance. There is mild thickening of the posterior and anterior mitral valve leaflet(s). There is mild calcification of the posterior mitral valve leaflet(s). Mild to moderate mitral annular calcification. Trivial mitral valve regurgitation.  Tricuspid Valve: The tricuspid valve is grossly normal. Tricuspid valve regurgitation is trivial.  Aortic Valve: The aortic valve is tricuspid. Aortic valve regurgitation is not visualized. No aortic stenosis is present.  Pulmonic Valve: The pulmonic valve was grossly normal. Pulmonic valve regurgitation is trivial.  Aorta: The aortic root and ascending aorta  are structurally normal, with no evidence of dilitation.  IAS/Shunts: No atrial level shunt detected by color flow Doppler.   LEFT VENTRICLE PLAX 2D LVIDd:         3.30 cm         Diastology LVIDs:         3.10 cm         LV e' medial:    3.05 cm/s LV PW:         1.60 cm         LV E/e' medial:  24.6 LV IVS:        1.20 cm         LV e' lateral:   4.13 cm/s LVOT diam:     2.10 cm         LV E/e' lateral: 18.1 LV SV:         56 LV SV Index:   29              2D LVOT Area:     3.46 cm        Longitudinal Strain 2D Strain GLS  -11.3 % (A2C): 2D Strain GLS  -12.1 % (A3C): 2D Strain GLS  -12.0 % (A4C): 2D Strain  GLS  -11.8 % Avg:  3D Volume EF LV 3D EF:    Left ventricul ar ejection fraction by 3D volume is 64 %.  3D Volume EF: 3D EF:        64 % LV EDV:       107 ml LV ESV:       38 ml LV SV:        68 ml  RIGHT VENTRICLE RV Basal diam:  2.40 cm RV Mid diam:    2.60 cm RV S prime:     10.70 cm/s TAPSE (M-mode): 2.1 cm  LEFT ATRIUM             Index        RIGHT ATRIUM          Index LA diam:        3.50 cm 1.80 cm/m   RA Area:     7.82 cm LA Vol (A2C):   43.9 ml 22.56 ml/m  RA Volume:   12.80 ml 6.58 ml/m LA Vol (A4C):   33.1 ml 17.03 ml/m LA Biplane Vol: 42.8 ml 22.02 ml/m AORTIC VALVE LVOT Vmax:   94.30 cm/s LVOT Vmean:  64.200 cm/s LVOT VTI:    0.162 m  AORTA Ao Root diam: 3.30 cm Ao Asc diam:  3.10 cm  MITRAL VALVE MV Area (PHT): 4.96 cm     SHUNTS MV Decel Time: 153 msec     Systemic VTI:  0.16 m MV E velocity: 74.90 cm/s   Systemic Diam: 2.10 cm MV A velocity: 119.00 cm/s MV E/A ratio:  0.63  Vinie Maxcy MD Electronically signed by Vinie Maxcy MD Signature Date/Time: 08/29/2022/4:35:53 PM    Final          ______________________________________________________________________________________________     Recent Labs: No results found for requested labs within last 365 days.  Recent Lipid Panel    Component Value Date/Time   CHOL 289 (A) 06/25/2019 0000   TRIG 205 (A) 06/25/2019 0000   HDL 36 06/25/2019 0000   LDLCALC 212 06/25/2019 0000    History of Present Illness    63 year old female with the above past medical history including  carotid artery disease, aortic atherosclerosis noted on prior CT, hypertension,  hyperlipidemia, type 2 diabetes, CKD stage IV, and anemia.    She established care with cardiology service in July 2024 for the evaluation of SOB.  Carotid ultrasound 07/26/2022 showed right ICA consistent with 1-39% stenosis and left ICA consistent with 40-59% stenosis.  Nuclear stress test in 08/2022 showed normal perfusion,  moderate systolic dysfunction EF 36%, intermediate risk due to systolic dysfunction. Follow-up echocardiogram in 08/2022 showed LVEF 60 to 65%, no RWMA, moderate LVH, grade 1 DD, RV function and size normal, trivial MR. Given reassuring cardiac workup, it was noted that chronic anemia and generally physical deconditioning were possibly contributing to her shortness of breath.  Carvedilol  and lisinopril were discontinued in the setting of hypotension.  She was last seen in 20 2025 and was stable for cardiac standpoint.  BP and heart rate were elevated.  Carvedilol  was reintroduced to 6.25 mg twice daily.  Of note, she follows with [facility] and is pending possible renal transplant.    She presents today for follow-up accompanied by her significant other. Since her last visit she has been stable overall from a cardiac standpoint.  She noticed increased fatigue with re-introduction of carvedilol .  As a result, she has been taking carvedilol  6.25 mg once daily.  BP has been stable though she continues to report significant fatigue.  She denies any chest pain, dyspnea, palpitations, dizziness, edema, PND, orthopnea, weight gain.  She is pending renal transplant through [facility].  She has been approved and is on the waiting list.  Home Medications    Current Outpatient Medications  Medication Sig Dispense Refill   acetaminophen  (TYLENOL ) 500 MG tablet Take 500 mg by mouth every 6 (six) hours as needed for moderate pain.     albuterol (VENTOLIN HFA) 108 (90 Base) MCG/ACT inhaler INHALE 1 PUFF BY MOUTH EVERY 4 HOURS AS NEEDED     Albuterol Sulfate, sensor, (PROAIR DIGIHALER) 108 (90 Base) MCG/ACT AEPB inhale 2 puff by inhalation route  every 4 - 6 hours as needed     amitriptyline  (ELAVIL ) 50 MG tablet TAKE 1 TABLET BY MOUTH AT BEDTIME 30 tablet 5   ammonium lactate (AMLACTIN) 12 % cream APPLY CREAM TOPICALLY TO AFFECTED AREA ONCE DAILY     ascorbic acid (VITAMIN C) 500 MG tablet Take 500 mg by mouth daily.      aspirin  EC 81 MG tablet Take 1 tablet (81 mg total) by mouth daily. Swallow whole. 30 tablet 12   Blood Glucose Monitoring Suppl (ONETOUCH VERIO) w/Device KIT 1 each by Does not apply route as needed. 1 kit 0   carvedilol  (COREG ) 6.25 MG tablet Take 1 tablet (6.25 mg total) by mouth 2 (two) times daily. 180 tablet 3   cholecalciferol (VITAMIN D3) 25 MCG (1000 UT) tablet Take 2 tablets (2,000 Units total) by mouth daily. 30 tablet 11   collagenase (SANTYL) 250 UNIT/GM ointment      ferrous sulfate 325 (65 FE) MG tablet Take 325 mg by mouth 3 (three) times daily with meals.     glucose blood (ONETOUCH VERIO) test strip Use as instructed 100 each 12   glucose blood test strip One touch verio glucose test strip.  Use as instructed 100 each 0   hydrOXYzine (ATARAX) 25 MG tablet Take 1 tablet by mouth daily as needed.     Insulin  Aspart (NOVOLOG  [place]) Inject 10-16 Units into the skin See admin instructions.     Insulin  Degludec (TRESIBA) 100 UNIT/ML SOLN Inject 70 Units into the skin at bedtime.  Take 50 units at bedtime     levothyroxine (SYNTHROID) 75 MCG tablet Take 75 mcg by mouth daily.     linaclotide  (LINZESS ) 145 MCG CAPS capsule Take 1 capsule by mouth daily.     olopatadine (PATANOL) 0.1 % ophthalmic solution INSTILL 1 DROP INTO AFFECTED EYE(S) TWICE DAILY     omeprazole  (PRILOSEC) 40 MG capsule Take 1 capsule by mouth daily.     ondansetron (ZOFRAN-ODT) 4 MG disintegrating tablet DISSOLVE 1 TABLET IN MOUTH EVERY 8 HOURS AS NEEDED FOR NAUSEA.     pantoprazole  (PROTONIX ) 40 MG tablet 40 mg by oral route.     polyethylene glycol (GOLYTELY) 236 g solution USE AS DIRECTED FOR COLON PREP     polyethylene glycol (MIRALAX / GLYCOLAX) packet Take 17 g by mouth daily as needed for mild constipation.      pregabalin (LYRICA) 75 MG capsule Take 75 mg by mouth 2 (two) times daily.     rosuvastatin  (CRESTOR ) 40 MG tablet Take 1 tablet by mouth daily.     silver sulfADIAZINE (SSD) 1 % cream      SOLIQUA  100-33 UNT-MCG/ML SOPN INJECT 35-45 UNITS SUBCUTANEOUSLY ONCE DAILY TITRATING DOSE     triamcinolone cream (KENALOG) 0.1 % APPLY CREAM EXTERNALLY TO AFFECTED AREA TWICE A WEEK     Zinc 100 MG TABS Take 1 tablet by mouth daily.     atorvastatin  (LIPITOR) 80 MG tablet Take 1 tablet (80 mg total) by mouth daily. (Patient not taking: Reported on 06/30/2023) 90 tablet 3   Cholecalciferol 125 MCG (5000 UT) capsule 5000 units by oral route. (Patient not taking: Reported on 06/30/2023)     No current facility-administered medications for this visit.     Review of Systems    She denies chest pain, palpitations, dyspnea, pnd, orthopnea, n, v, dizziness, syncope, edema, weight gain, or early satiety. All other systems reviewed and are otherwise negative except as noted above.   Physical Exam    VS:  BP (!) 128/90   Pulse 82   Ht 5' 6 (1.676 m)   Wt 186 lb 3.2 oz (84.5 kg)   SpO2 98%   BMI 30.05 kg/m   GEN: Well nourished, well developed, in no acute distress. HEENT: normal. Neck: Supple, no JVD, carotid bruits, or masses. Cardiac: RRR, no murmurs, rubs, or gallops. No clubbing, cyanosis, edema.  Radials/DP/PT 2+ and equal bilaterally.  Respiratory:  Respirations regular and unlabored, clear to auscultation bilaterally. GI: Soft, nontender, nondistended, BS + x 4. MS: no deformity or atrophy. Skin: warm and dry, no rash. Neuro:  Strength and sensation are intact. Psych: Normal affect.  Accessory Clinical Findings    ECG personally reviewed by me today -    - no EKG in office today.   Lab Results  Component Value Date   WBC 6.5 03/21/2020   HGB 9.7 (L) 03/21/2020   HCT 30.0 (L) 03/21/2020   MCV 85.7 03/21/2020   PLT 303 03/21/2020   Lab Results  Component Value Date   CREATININE 1.40 (H) 03/21/2020   BUN 28 (H) 03/21/2020   NA 132 (L) 03/21/2020   K 4.2 03/21/2020   CL 100 03/21/2020   CO2 24 03/21/2020   Lab Results  Component Value Date   ALT 16 02/01/2018   AST 15  02/01/2018   ALKPHOS 115 11/14/2016   BILITOT 0.3 02/01/2018   Lab Results  Component Value Date   CHOL 289 (A) 06/25/2019   HDL 36 06/25/2019  LDLCALC 212 06/25/2019   TRIG 205 (A) 06/25/2019    Lab Results  Component Value Date   HGBA1C 12.4 06/25/2019    Assessment & Plan   1. Hypertension/hypotension/fatigue: Carvedilol  and lisinopril were previously discontinued in the setting of hypotension. Carvedilol  was reintroduced at last office visit.  She reports generalized fatigue, and has been taking carvedilol  6.25 mg once daily in this setting.  Despite this change, she continues to report significant fatigue. BP has been stable. Will change carvedilol  to 3.125 mg twice daily, goal BP less than 130/80. Will check CBC, CMET, and TSH today.    2. Shortness of breath:  Nuclear stress test in 08/2022 showed normal perfusion, moderate systolic dysfunction EF 36%, intermediate risk due to systolic dysfunction. Follow-up echocardiogram in 08/2022 showed LVEF 60 to 65%, no RWMA, moderate LVH, grade 1 DD, RV function and size normal, trivial MR. She denies any recent dyspnea though she does report generalized fatigue as above. She denies chest pain.  Euvolemic and well compensated on exam. Suspect chronic anemia and generally physical deconditioning could be contributing to symptoms.  Will check labs as above.  If symptoms persist, consider need for additional testing, will defer for now.   3. Carotid artery stenosis: Carotid ultrasound 07/26/2022 showed right ICA consistent with 1-39% stenosis and left ICA consistent with 40-59% stenosis. Asymptomatic. Continue ASA, Lipitor.    4. Aortic atherosclerosis: Noted on prior CT. Continue Lipitor.    5. Hyperlipidemia: No recent LDL on file.  Will update fasting lipids today.  Continue Lipitor.   6. Type 2 diabetes: A1c was 7.5 in 01/2023.  She is working on lifestyle modifications with diet and exercise.  Will repeat hemoglobin A1c  today they are.    7. CKD stage IV: Creatine was 2.7 in 01/2023.  Repeat CMET pending as above. Following with [facility], pending renal transplant.    8. Anemia: Hemoglobin was stable at 10.0 in 11/2022.  Will repeat CBC.   9. Disposition: Follow-up in 2 months, sooner if needed.   Damien JAYSON Braver, NP 06/30/2023, 8:21 AM

## 2023-07-01 ENCOUNTER — Telehealth: Payer: Self-pay | Admitting: Cardiology

## 2023-07-01 DIAGNOSIS — E875 Hyperkalemia: Secondary | ICD-10-CM

## 2023-07-01 LAB — CBC
Hematocrit: 28.2 — AB (ref 34.0–46.6)
Hemoglobin: 9 g/dL — AB (ref 11.1–15.9)
MCH: 28.1 pg (ref 26.6–33.0)
MCHC: 31.9 g/dL (ref 31.5–35.7)
MCV: 88 fL (ref 79–97)
Platelets: 307 10*3/uL (ref 150–450)
RBC: 3.2 x10E6/uL — AB (ref 3.77–5.28)
RDW: 14.5 (ref 11.7–15.4)
WBC: 7.2 10*3/uL (ref 3.4–10.8)

## 2023-07-01 LAB — COMPREHENSIVE METABOLIC PANEL WITH GFR
ALT: 26 IU/L (ref 0–32)
AST: 26 IU/L (ref 0–40)
Albumin: 3.7 g/dL — ABNORMAL LOW (ref 3.9–4.9)
Alkaline Phosphatase: 181 IU/L — ABNORMAL HIGH (ref 44–121)
BUN/Creatinine Ratio: 14 (ref 12–28)
BUN: 45 mg/dL — ABNORMAL HIGH (ref 8–27)
Bilirubin Total: <0.2 mg/dL (ref 0.0–1.2)
CO2: 16 mmol/L — ABNORMAL LOW (ref 20–29)
Calcium: 8.9 mg/dL (ref 8.7–10.3)
Chloride: 109 mmol/L — ABNORMAL HIGH (ref 96–106)
Creatinine, Ser: 3.27 mg/dL — ABNORMAL HIGH (ref 0.57–1.00)
Globulin, Total: 3.2 g/dL (ref 1.5–4.5)
Glucose: 157 mg/dL — ABNORMAL HIGH (ref 70–99)
Potassium: 5.8 mmol/L (ref 3.5–5.2)
Sodium: 138 mmol/L (ref 134–144)
Total Protein: 6.9 g/dL (ref 6.0–8.5)
eGFR: 15 mL/min/{1.73_m2} — ABNORMAL LOW (ref >59)

## 2023-07-01 LAB — HEMOGLOBIN A1C
Est. average glucose Bld gHb Est-mCnc: 180 mg/dL
Hgb A1c MFr Bld: 7.9 — AB (ref 4.8–5.6)

## 2023-07-01 LAB — LIPID PANEL
Chol/HDL Ratio: 5.1 ratio — ABNORMAL HIGH (ref 0.0–4.4)
Cholesterol, Total: 203 mg/dL — ABNORMAL HIGH (ref 100–199)
HDL: 40 mg/dL (ref >39)
LDL Chol Calc (NIH): 134 mg/dL — ABNORMAL HIGH (ref 0–99)
Triglycerides: 160 mg/dL — ABNORMAL HIGH (ref 0–149)
VLDL Cholesterol Cal: 29 mg/dL (ref 5–40)

## 2023-07-01 LAB — TSH: TSH: 1.47 u[IU]/mL (ref 0.450–4.500)

## 2023-07-01 NOTE — Telephone Encounter (Signed)
 Outpatient service line: Hyperkalemia  Fellow overnight notified me that potassium was elevated 5.8.  Seen yesterday in office.  Question if this is a hemolyzed sample as there are no obvious medications that are contributing and we have had multiple hyperkalemic issues lately.  Unable to reach patient.  Please call patient Monday morning and ensure that they are not taking any potassium supplementation and repeat BMP Monday morning.  Creatinine is elevated but per previous value seems to be around her baseline more recently.  Would ensure she is maintaining adequate hydration as well.  Avoid foods with potassium like bananas.

## 2023-07-02 ENCOUNTER — Encounter: Payer: Self-pay | Admitting: Nurse Practitioner

## 2023-07-03 ENCOUNTER — Ambulatory Visit: Payer: Self-pay | Admitting: Nurse Practitioner

## 2023-07-03 DIAGNOSIS — E785 Hyperlipidemia, unspecified: Secondary | ICD-10-CM

## 2023-07-03 DIAGNOSIS — I10 Essential (primary) hypertension: Secondary | ICD-10-CM

## 2023-07-03 NOTE — Addendum Note (Signed)
 Addended by: CASIMIR ALDONA BRAVO on: 07/03/2023 07:54 AM   Modules accepted: Orders

## 2023-07-03 NOTE — Telephone Encounter (Signed)
Attempted phone call to pt and left voicemail message to contact office at [phone]. 

## 2023-07-04 NOTE — Telephone Encounter (Signed)
 Patient returned RN's call regarding results.

## 2023-07-04 NOTE — Telephone Encounter (Signed)
 Pt has already been notified of results per Damien Braver, NP.  See note below and result encounter for complete details.  07/04/23  2:34 PM Spoke with pt. Pt was notified of lab results and recommendations. Pt will repeat BMET as directed and avoid dietary sources of potassium. Pt was agreeable to see pharm-d to discuss lipid lowering therapy. Orders placed. Please arrange Pharm-D appointment.Georgina Hila, CMA

## 2023-07-04 NOTE — Telephone Encounter (Signed)
 Spoke with pt. Pt was notified of lab results and recommendations. Pt will repeat BMET as directed and avoid dietary sources of potassium. Pt was agreeable to see pharm-d to discuss lipid lowering therapy. Orders placed. Please arrange Pharm-D appointment.

## 2023-07-26 ENCOUNTER — Ambulatory Visit: Attending: Cardiovascular Disease

## 2023-08-22 ENCOUNTER — Other Ambulatory Visit: Payer: Self-pay | Admitting: Cardiovascular Disease

## 2023-08-22 DIAGNOSIS — I6523 Occlusion and stenosis of bilateral carotid arteries: Secondary | ICD-10-CM

## 2023-08-24 ENCOUNTER — Ambulatory Visit: Attending: Cardiology

## 2023-08-24 DIAGNOSIS — I6523 Occlusion and stenosis of bilateral carotid arteries: Secondary | ICD-10-CM

## 2023-08-27 ENCOUNTER — Ambulatory Visit: Payer: Self-pay | Admitting: Cardiovascular Disease

## 2023-08-27 DIAGNOSIS — I6523 Occlusion and stenosis of bilateral carotid arteries: Secondary | ICD-10-CM

## 2023-08-30 ENCOUNTER — Ambulatory Visit: Attending: Cardiovascular Disease | Admitting: Nurse Practitioner

## 2023-08-30 NOTE — Progress Notes (Deleted)
 Office Visit    Patient Name: Brianna [name] Date of Encounter: 08/30/2023  Primary Care Provider:  Kristine Corean Deed, NP Primary Cardiologist:  Darryle ONEIDA Decent, MD  Chief Complaint    63 year old female a history of  carotid artery disease, aortic atherosclerosis noted on prior CT, hypertension, hyperlipidemia, type 2 diabetes, CKD stage IV, and anemia who presents for follow-up related to hypertension/hypotension.   Past Medical History    Past Medical History:  Diagnosis Date   Diabetes mellitus (HCC)    Gastritis    GERD without esophagitis    Past Surgical History:  Procedure Laterality Date   62 HOUR PH STUDY  01/10/2018   Procedure: 24 HOUR PH STUDY;  Surgeon: Shila Gustav GAILS, MD;  Location: [facility];  Service: Endoscopy;;   CARPAL TUNNEL RELEASE     left   COLONOSCOPY WITH ESOPHAGOGASTRODUODENOSCOPY (EGD) N/A 04/02/2012   SLF: SMALL RECTAL polyp/Small internal hemorrhoids. Polyp hyperplastic. On EGD she had mild gastritis. Biopsy showed chronic and active gastritis and no H. pylori. Next colonoscopy in 10 years with overtube/? Propofol   ESOPHAGEAL DILATION  12/09/2016   Procedure: ESOPHAGEAL DILATION;  Surgeon: Harvey Margo CROME, MD;  Location: [facility];  Service: Endoscopy;;   ESOPHAGEAL MANOMETRY N/A 01/10/2018   Procedure: ESOPHAGEAL MANOMETRY (EM);  Surgeon: Shila Gustav GAILS, MD;  Location: [facility];  Service: Endoscopy;  Laterality: N/A;   ESOPHAGOGASTRODUODENOSCOPY  03/2012   SLF: gastritis   ESOPHAGOGASTRODUODENOSCOPY N/A 12/09/2016   Procedure: ESOPHAGOGASTRODUODENOSCOPY (EGD);  Surgeon: Harvey Margo CROME, MD;  [facility]: [facility];  Service: Endoscopy;  Laterality: N/A;  11:30am   ESOPHAGOGASTRODUODENOSCOPY N/A 08/22/2017   Procedure: ESOPHAGOGASTRODUODENOSCOPY (EGD);  Surgeon: Harvey Margo CROME, MD;  [facility]: [facility];  Service: Endoscopy;  Laterality: N/A;  11:30am   PARTIAL HYSTERECTOMY     PH IMPEDANCE STUDY N/A  01/10/2018   Procedure: PH IMPEDANCE STUDY;  Surgeon: Shila Gustav GAILS, MD;  Location: [facility];  Service: Endoscopy;  Laterality: N/A;   SAVORY DILATION N/A 08/22/2017   Procedure: SAVORY DILATION;  Surgeon: Harvey Margo CROME, MD;  [facility]: [facility];  Service: Endoscopy;  Laterality: N/A;   TRIGGER FINGER RELEASE Left     Allergies  Allergies  Allergen Reactions   Other Hives and Shortness Of Breath    seafood   Bacitracin    Iodine Hives    shellfish   Tramadol     Neosporin [Neomycin-Bacitracin Zn-Polymyx] Rash     Labs/Other Studies Reviewed    The following studies were reviewed today:  Cardiac Studies & Procedures   ______________________________________________________________________________________________   STRESS TESTS  MYOCARDIAL PERFUSION IMAGING 08/05/2022  Interpretation Summary   Findings are consistent with no ischemia. The study is intermediate risk.   ST depression in the inferior leads (II, III, aVF) and V6 was noted at baseline, unchanged with stress   LV perfusion is normal. There is no evidence of ischemia. There is no evidence of infarction.   Left ventricular function is abnormal. Global function is moderately reduced. Nuclear stress EF: 36%. The left ventricular ejection fraction is moderately decreased (30-44%). End diastolic cavity size is mildly enlarged. End systolic cavity size is mildly enlarged.   Prior study not available for comparison.  Normal perfusion Moderate systolic dysfunction, EF 36% Intermediate risk study due to systolic dysfunction   ECHOCARDIOGRAM  ECHOCARDIOGRAM COMPLETE 08/30/2022  Narrative ECHOCARDIOGRAM REPORT    Patient Name:   Brianna [name] Date of Exam: 08/29/2022 Medical Rec #:  [id number]  Height:       68.0 in Accession #:    [id number]            Weight:       177.8 lb Date of Birth:  May 22, 1960             BSA:          1.944 m Patient Age:    62 years              BP:            98/81 mmHg Patient Gender: F                     HR:           100 bpm. Exam Location:  [address]  Procedure: 2D Echo, 3D Echo, Cardiac Doppler, Color Doppler and Strain Analysis  Indications:    R06.02 Dyspnea  History:        Patient has no prior history of Echocardiogram examinations. Signs/Symptoms:Shortness of Breath; Risk Factors:Diabetes, Dyslipidemia and Hypertension.  Sonographer:    Nolon Berg BA, RDCS Referring Phys: DARRYLE NED O'NEAL  IMPRESSIONS   1. Left ventricular ejection fraction, by estimation, is 60 to 65%. Left ventricular ejection fraction by 3D volume is 64 %. The left ventricle has normal function. The left ventricle has no regional wall motion abnormalities. There is moderate left ventricular hypertrophy. Left ventricular diastolic parameters are consistent with Grade I diastolic dysfunction (impaired relaxation). Elevated left ventricular end-diastolic pressure. The E/e' is 22. The average left ventricular global longitudinal strain is -11.8 %. The global longitudinal strain is abnormal. 2. Right ventricular systolic function is low normal. The right ventricular size is normal. Tricuspid regurgitation signal is inadequate for assessing PA pressure. 3. The mitral valve is degenerative. Trivial mitral valve regurgitation. 4. The aortic valve is tricuspid. Aortic valve regurgitation is not visualized. No aortic stenosis is present.  Comparison(s): No prior Echocardiogram.  FINDINGS Left Ventricle: Left ventricular ejection fraction, by estimation, is 60 to 65%. Left ventricular ejection fraction by 3D volume is 64 %. The left ventricle has normal function. The left ventricle has no regional wall motion abnormalities. The average left ventricular global longitudinal strain is -11.8 %. The global longitudinal strain is abnormal. The left ventricular internal cavity size was normal in size. There is moderate left ventricular hypertrophy. Left ventricular  diastolic parameters are consistent with Grade I diastolic dysfunction (impaired relaxation). Elevated left ventricular end-diastolic pressure. The E/e' is 22.  Right Ventricle: The right ventricular size is normal. No increase in right ventricular wall thickness. Right ventricular systolic function is low normal. Tricuspid regurgitation signal is inadequate for assessing PA pressure.  Left Atrium: Left atrial size was normal in size.  Right Atrium: Right atrial size was normal in size.  Pericardium: Trivial pericardial effusion is present. The pericardial effusion is anterior to the right ventricle and surrounding the apex.  Mitral Valve: The mitral valve is degenerative in appearance. There is mild thickening of the posterior and anterior mitral valve leaflet(s). There is mild calcification of the posterior mitral valve leaflet(s). Mild to moderate mitral annular calcification. Trivial mitral valve regurgitation.  Tricuspid Valve: The tricuspid valve is grossly normal. Tricuspid valve regurgitation is trivial.  Aortic Valve: The aortic valve is tricuspid. Aortic valve regurgitation is not visualized. No aortic stenosis is present.  Pulmonic Valve: The pulmonic valve was grossly normal. Pulmonic valve regurgitation is trivial.  Aorta: The aortic root and ascending aorta  are structurally normal, with no evidence of dilitation.  IAS/Shunts: No atrial level shunt detected by color flow Doppler.   LEFT VENTRICLE PLAX 2D LVIDd:         3.30 cm         Diastology LVIDs:         3.10 cm         LV e' medial:    3.05 cm/s LV PW:         1.60 cm         LV E/e' medial:  24.6 LV IVS:        1.20 cm         LV e' lateral:   4.13 cm/s LVOT diam:     2.10 cm         LV E/e' lateral: 18.1 LV SV:         56 LV SV Index:   29              2D LVOT Area:     3.46 cm        Longitudinal Strain 2D Strain GLS  -11.3 % (A2C): 2D Strain GLS  -12.1 % (A3C): 2D Strain GLS  -12.0 % (A4C): 2D Strain  GLS  -11.8 % Avg:  3D Volume EF LV 3D EF:    Left ventricul ar ejection fraction by 3D volume is 64 %.  3D Volume EF: 3D EF:        64 % LV EDV:       107 ml LV ESV:       38 ml LV SV:        68 ml  RIGHT VENTRICLE RV Basal diam:  2.40 cm RV Mid diam:    2.60 cm RV S prime:     10.70 cm/s TAPSE (M-mode): 2.1 cm  LEFT ATRIUM             Index        RIGHT ATRIUM          Index LA diam:        3.50 cm 1.80 cm/m   RA Area:     7.82 cm LA Vol (A2C):   43.9 ml 22.56 ml/m  RA Volume:   12.80 ml 6.58 ml/m LA Vol (A4C):   33.1 ml 17.03 ml/m LA Biplane Vol: 42.8 ml 22.02 ml/m AORTIC VALVE LVOT Vmax:   94.30 cm/s LVOT Vmean:  64.200 cm/s LVOT VTI:    0.162 m  AORTA Ao Root diam: 3.30 cm Ao Asc diam:  3.10 cm  MITRAL VALVE MV Area (PHT): 4.96 cm     SHUNTS MV Decel Time: 153 msec     Systemic VTI:  0.16 m MV E velocity: 74.90 cm/s   Systemic Diam: 2.10 cm MV A velocity: 119.00 cm/s MV E/A ratio:  0.63  Vinie Maxcy MD Electronically signed by Vinie Maxcy MD Signature Date/Time: 08/29/2022/4:35:53 PM    Final          ______________________________________________________________________________________________     Recent Labs: 06/30/2023: ALT 26; BUN 45; Creatinine, Ser 3.27; Hemoglobin 9.0; Platelets 307; Potassium 5.8; Sodium 138; TSH 1.470  Recent Lipid Panel    Component Value Date/Time   CHOL 203 (H) 06/30/2023 0902   TRIG 160 (H) 06/30/2023 0902   HDL 40 06/30/2023 0902   CHOLHDL 5.1 (H) 06/30/2023 0902   LDLCALC 134 (H) 06/30/2023 0902    History of Present Illness    63 year old female with the  above past medical history including  carotid artery disease, aortic atherosclerosis noted on prior CT, hypertension, hyperlipidemia, type 2 diabetes, CKD stage IV, and anemia.    She established care with cardiology service in July 2024 for the evaluation of SOB.  Carotid ultrasound 07/26/2022 showed right ICA consistent with 1-39% stenosis and  left ICA consistent with 40-59% stenosis.  Nuclear stress test in 08/2022 showed normal perfusion, moderate systolic dysfunction EF 36%, intermediate risk due to systolic dysfunction. Follow-up echocardiogram in 08/2022 showed LVEF 60 to 65%, no RWMA, moderate LVH, grade 1 DD, RV function and size normal, trivial MR. Given reassuring cardiac workup, it was noted that chronic anemia and generally physical deconditioning were possibly contributing to her shortness of breath.  Carvedilol  and lisinopril were discontinued in the setting of hypotension, carvedilol  was later reintroduced. Of note, she follows with [facility] and is pending possible renal transplant.  She was last seen in the office on 06/30/2023 and reported increased fatigue with carvedilol .  Carvedilol  dosing was reduced to 3.25 mg twice daily.  She was referred to lipid clinic Pharm.D. for consideration of PCSK9 inhibitor.   She presents today for follow-up accompanied by her significant other. Since her last visit she has been   1. Hypertension/hypotension/fatigue: Carvedilol  and lisinopril were previously discontinued in the setting of hypotension. Carvedilol  was reintroduced at last office visit.  She reports generalized fatigue, and has been taking carvedilol  6.25 mg once daily in this setting.  Despite this change, she continues to report significant fatigue. BP has been stable. Will change carvedilol  to 3.125 mg twice daily, goal BP less than 130/80. Will check CBC, CMET, and TSH today.     2. Shortness of breath:  Nuclear stress test in 08/2022 showed normal perfusion, moderate systolic dysfunction EF 36%, intermediate risk due to systolic dysfunction. Follow-up echocardiogram in 08/2022 showed LVEF 60 to 65%, no RWMA, moderate LVH, grade 1 DD, RV function and size normal, trivial MR. She denies any recent dyspnea though she does report generalized fatigue as above. She denies chest pain.  Euvolemic and well compensated on exam. Suspect  chronic anemia and generally physical deconditioning could be contributing to symptoms.  Will check labs as above.  If symptoms persist, consider need for additional testing, will defer for now.   3. Carotid artery stenosis: Carotid ultrasound 07/26/2022 showed right ICA consistent with 1-39% stenosis and left ICA consistent with 40-59% stenosis. Asymptomatic. Continue ASA, Lipitor.    4. Aortic atherosclerosis: Noted on prior CT. Continue Lipitor.    5. Hyperlipidemia: No recent LDL on file.  Will update fasting lipids today.  Continue Lipitor.   6. Type 2 diabetes: A1c was 7.5 in 01/2023.  She is working on lifestyle modifications with diet and exercise.  Will repeat hemoglobin A1c  today they are.   7. CKD stage IV: Creatine was 2.7 in 01/2023.  Repeat CMET pending as above. Following with [facility], pending renal transplant.    8. Anemia: Hemoglobin was stable at 10.0 in 11/2022.  Will repeat CBC.   9. Disposition: Follow-up in    Home Medications    Current Outpatient Medications  Medication Sig Dispense Refill   acetaminophen  (TYLENOL ) 500 MG tablet Take 500 mg by mouth every 6 (six) hours as needed for moderate pain.     albuterol (VENTOLIN HFA) 108 (90 Base) MCG/ACT inhaler INHALE 1 PUFF BY MOUTH EVERY 4 HOURS AS NEEDED     Albuterol Sulfate, sensor, (PROAIR DIGIHALER) 108 (90 Base) MCG/ACT AEPB  inhale 2 puff by inhalation route  every 4 - 6 hours as needed     amitriptyline  (ELAVIL ) 50 MG tablet TAKE 1 TABLET BY MOUTH AT BEDTIME 30 tablet 5   ammonium lactate (AMLACTIN) 12 % cream APPLY CREAM TOPICALLY TO AFFECTED AREA ONCE DAILY     ascorbic acid (VITAMIN C) 500 MG tablet Take 500 mg by mouth daily.     aspirin  EC 81 MG tablet Take 1 tablet (81 mg total) by mouth daily. Swallow whole. 30 tablet 12   atorvastatin  (LIPITOR) 80 MG tablet Take 1 tablet (80 mg total) by mouth daily. (Patient not taking: Reported on 06/30/2023) 90 tablet 3   Blood Glucose Monitoring Suppl (ONETOUCH  VERIO) w/Device KIT 1 each by Does not apply route as needed. 1 kit 0   carvedilol  (COREG ) 3.125 MG tablet Take 1 tablet (3.125 mg total) by mouth 2 (two) times daily with a meal. 180 tablet 3   cholecalciferol (VITAMIN D3) 25 MCG (1000 UT) tablet Take 2 tablets (2,000 Units total) by mouth daily. 30 tablet 11   Cholecalciferol 125 MCG (5000 UT) capsule 5000 units by oral route. (Patient not taking: Reported on 06/30/2023)     collagenase (SANTYL) 250 UNIT/GM ointment      ferrous sulfate 325 (65 FE) MG tablet Take 325 mg by mouth 3 (three) times daily with meals.     glucose blood (ONETOUCH VERIO) test strip Use as instructed 100 each 12   glucose blood test strip One touch verio glucose test strip.  Use as instructed 100 each 0   hydrOXYzine (ATARAX) 25 MG tablet Take 1 tablet by mouth daily as needed.     Insulin  Aspart (NOVOLOG  [place]) Inject 10-16 Units into the skin See admin instructions.     Insulin  Degludec (TRESIBA) 100 UNIT/ML SOLN Inject 70 Units into the skin at bedtime. Take 50 units at bedtime     levothyroxine (SYNTHROID) 75 MCG tablet Take 75 mcg by mouth daily.     linaclotide  (LINZESS ) 145 MCG CAPS capsule Take 1 capsule by mouth daily.     olopatadine (PATANOL) 0.1 % ophthalmic solution INSTILL 1 DROP INTO AFFECTED EYE(S) TWICE DAILY     omeprazole  (PRILOSEC) 40 MG capsule Take 1 capsule by mouth daily.     ondansetron (ZOFRAN-ODT) 4 MG disintegrating tablet DISSOLVE 1 TABLET IN MOUTH EVERY 8 HOURS AS NEEDED FOR NAUSEA.     pantoprazole  (PROTONIX ) 40 MG tablet 40 mg by oral route.     polyethylene glycol (GOLYTELY) 236 g solution USE AS DIRECTED FOR COLON PREP     polyethylene glycol (MIRALAX / GLYCOLAX) packet Take 17 g by mouth daily as needed for mild constipation.      pregabalin (LYRICA) 75 MG capsule Take 75 mg by mouth 2 (two) times daily.     rosuvastatin  (CRESTOR ) 40 MG tablet Take 1 tablet by mouth daily.     silver sulfADIAZINE (SSD) 1 % cream      SOLIQUA 100-33  UNT-MCG/ML SOPN INJECT 35-45 UNITS SUBCUTANEOUSLY ONCE DAILY TITRATING DOSE     triamcinolone cream (KENALOG) 0.1 % APPLY CREAM EXTERNALLY TO AFFECTED AREA TWICE A WEEK     Zinc 100 MG TABS Take 1 tablet by mouth daily.     No current facility-administered medications for this visit.     Review of Systems    ***.  All other systems reviewed and are otherwise negative except as noted above.    Physical Exam    VS:  There were no vitals taken for this visit. , BMI There is no height or weight on file to calculate BMI.     GEN: Well nourished, well developed, in no acute distress. HEENT: normal. Neck: Supple, no JVD, carotid bruits, or masses. Cardiac: RRR, no murmurs, rubs, or gallops. No clubbing, cyanosis, edema.  Radials/DP/PT 2+ and equal bilaterally.  Respiratory:  Respirations regular and unlabored, clear to auscultation bilaterally. GI: Soft, nontender, nondistended, BS + x 4. MS: no deformity or atrophy. Skin: warm and dry, no rash. Neuro:  Strength and sensation are intact. Psych: Normal affect.  Accessory Clinical Findings    ECG personally reviewed by me today -    - no acute changes.   Lab Results  Component Value Date   WBC 7.2 06/30/2023   HGB 9.0 (L) 06/30/2023   HCT 28.2 (L) 06/30/2023   MCV 88 06/30/2023   PLT 307 06/30/2023   Lab Results  Component Value Date   CREATININE 3.27 (H) 06/30/2023   BUN 45 (H) 06/30/2023   NA 138 06/30/2023   K 5.8 (HH) 06/30/2023   CL 109 (H) 06/30/2023   CO2 16 (L) 06/30/2023   Lab Results  Component Value Date   ALT 26 06/30/2023   AST 26 06/30/2023   ALKPHOS 181 (H) 06/30/2023   BILITOT <0.2 06/30/2023   Lab Results  Component Value Date   CHOL 203 (H) 06/30/2023   HDL 40 06/30/2023   LDLCALC 134 (H) 06/30/2023   TRIG 160 (H) 06/30/2023   CHOLHDL 5.1 (H) 06/30/2023    Lab Results  Component Value Date   HGBA1C 7.9 (H) 06/30/2023    Assessment & Plan    1.  ***  No BP recorded.  {Refresh Note OR  Click here to enter BP  :1}***   Damien JAYSON Braver, NP 08/30/2023, 6:40 AM

## 2023-10-30 DIAGNOSIS — J479 Bronchiectasis, uncomplicated: Secondary | ICD-10-CM | POA: Insufficient documentation

## 2023-12-06 ENCOUNTER — Other Ambulatory Visit: Payer: Self-pay

## 2023-12-06 ENCOUNTER — Inpatient Hospital Stay (HOSPITAL_COMMUNITY)

## 2023-12-06 ENCOUNTER — Encounter (HOSPITAL_COMMUNITY): Payer: Self-pay

## 2023-12-06 ENCOUNTER — Inpatient Hospital Stay (HOSPITAL_COMMUNITY)
Admission: EM | Admit: 2023-12-06 | Discharge: 2023-12-11 | DRG: 617 | Disposition: A | Attending: Internal Medicine | Admitting: Internal Medicine

## 2023-12-06 ENCOUNTER — Emergency Department (HOSPITAL_COMMUNITY)

## 2023-12-06 DIAGNOSIS — M869 Osteomyelitis, unspecified: Secondary | ICD-10-CM | POA: Diagnosis present

## 2023-12-06 DIAGNOSIS — I96 Gangrene, not elsewhere classified: Secondary | ICD-10-CM | POA: Diagnosis not present

## 2023-12-06 HISTORY — DX: Essential (primary) hypertension: I10

## 2023-12-06 LAB — CBC WITH DIFFERENTIAL/PLATELET
Abs Immature Granulocytes: 0.06 K/uL (ref 0.00–0.07)
Basophils Absolute: 0.1 K/uL (ref 0.0–0.1)
Basophils Relative: 1 %
Eosinophils Absolute: 0.2 K/uL (ref 0.0–0.5)
Eosinophils Relative: 2 %
HCT: 26.3 % — ABNORMAL LOW (ref 36.0–46.0)
Hemoglobin: 8.3 g/dL — ABNORMAL LOW (ref 12.0–15.0)
Immature Granulocytes: 1 %
Lymphocytes Relative: 16 %
Lymphs Abs: 1.5 K/uL (ref 0.7–4.0)
MCH: 28.8 pg (ref 26.0–34.0)
MCHC: 31.6 g/dL (ref 30.0–36.0)
MCV: 91.3 fL (ref 80.0–100.0)
Monocytes Absolute: 0.9 K/uL (ref 0.1–1.0)
Monocytes Relative: 9 %
Neutro Abs: 6.6 K/uL (ref 1.7–7.7)
Neutrophils Relative %: 71 %
Platelets: 431 K/uL — ABNORMAL HIGH (ref 150–400)
RBC: 2.88 MIL/uL — ABNORMAL LOW (ref 3.87–5.11)
RDW: 14.4 % (ref 11.5–15.5)
WBC: 9.3 K/uL (ref 4.0–10.5)
nRBC: 0 % (ref 0.0–0.2)

## 2023-12-06 LAB — COMPREHENSIVE METABOLIC PANEL WITH GFR
ALT: 12 U/L (ref 0–44)
AST: 19 U/L (ref 15–41)
Albumin: 3.5 g/dL (ref 3.5–5.0)
Alkaline Phosphatase: 148 U/L — ABNORMAL HIGH (ref 38–126)
Anion gap: 14 (ref 5–15)
BUN: 43 mg/dL — ABNORMAL HIGH (ref 8–23)
CO2: 18 mmol/L — ABNORMAL LOW (ref 22–32)
Calcium: 9.4 mg/dL (ref 8.9–10.3)
Chloride: 109 mmol/L (ref 98–111)
Creatinine, Ser: 3.38 mg/dL — ABNORMAL HIGH (ref 0.44–1.00)
GFR, Estimated: 15 mL/min — ABNORMAL LOW (ref >60)
Glucose, Bld: 273 mg/dL — ABNORMAL HIGH (ref 70–99)
Potassium: 4 mmol/L (ref 3.5–5.1)
Sodium: 141 mmol/L (ref 135–145)
Total Bilirubin: 0.3 mg/dL (ref 0.0–1.2)
Total Protein: 8.1 g/dL (ref 6.5–8.1)

## 2023-12-06 LAB — LIPID PANEL
Cholesterol: 210 mg/dL — ABNORMAL HIGH (ref 0–200)
HDL: 40 mg/dL — ABNORMAL LOW (ref >40)
LDL Cholesterol: 137 mg/dL — ABNORMAL HIGH (ref 0–99)
Total CHOL/HDL Ratio: 5.3 ratio
Triglycerides: 167 mg/dL — ABNORMAL HIGH (ref <150)
VLDL: 33 mg/dL (ref 0–40)

## 2023-12-06 LAB — I-STAT CHEM 8, ED
BUN: 42 mg/dL — ABNORMAL HIGH (ref 8–23)
Calcium, Ion: 1.17 mmol/L (ref 1.15–1.40)
Chloride: 112 mmol/L — ABNORMAL HIGH (ref 98–111)
Creatinine, Ser: 3.8 mg/dL — ABNORMAL HIGH (ref 0.44–1.00)
Glucose, Bld: 275 mg/dL — ABNORMAL HIGH (ref 70–99)
HCT: 25 % — ABNORMAL LOW (ref 36.0–46.0)
Hemoglobin: 8.5 g/dL — ABNORMAL LOW (ref 12.0–15.0)
Potassium: 4.1 mmol/L (ref 3.5–5.1)
Sodium: 144 mmol/L (ref 135–145)
TCO2: 20 mmol/L — ABNORMAL LOW (ref 22–32)

## 2023-12-06 LAB — GLUCOSE, CAPILLARY: Glucose-Capillary: 190 mg/dL — ABNORMAL HIGH (ref 70–99)

## 2023-12-06 LAB — TSH: TSH: 1.7 u[IU]/mL (ref 0.350–4.500)

## 2023-12-06 LAB — SEDIMENTATION RATE: Sed Rate: >140 mm/h — ABNORMAL HIGH (ref 0–30)

## 2023-12-06 LAB — C-REACTIVE PROTEIN: CRP: 8.2 mg/dL — ABNORMAL HIGH (ref <1.0)

## 2023-12-06 MED ORDER — GABAPENTIN 300 MG PO CAPS
300.0000 mg | ORAL_CAPSULE | Freq: Three times a day (TID) | ORAL | Status: DC
  Administered 2023-12-06 – 2023-12-10 (×10): 300 mg via ORAL
  Filled 2023-12-06 (×11): qty 1

## 2023-12-06 MED ORDER — HEPARIN SODIUM (PORCINE) 5000 UNIT/ML IJ SOLN
5000.0000 [IU] | Freq: Three times a day (TID) | INTRAMUSCULAR | Status: DC
  Administered 2023-12-06 – 2023-12-11 (×14): 5000 [IU] via SUBCUTANEOUS
  Filled 2023-12-06 (×14): qty 1

## 2023-12-06 MED ORDER — HYDRALAZINE HCL 20 MG/ML IJ SOLN
10.0000 mg | INTRAMUSCULAR | Status: DC | PRN
  Administered 2023-12-06 – 2023-12-07 (×2): 10 mg via INTRAVENOUS
  Filled 2023-12-06 (×2): qty 1

## 2023-12-06 MED ORDER — ONDANSETRON HCL 4 MG/2ML IJ SOLN
4.0000 mg | Freq: Four times a day (QID) | INTRAMUSCULAR | Status: DC | PRN
  Administered 2023-12-06 – 2023-12-09 (×6): 4 mg via INTRAVENOUS
  Filled 2023-12-06 (×6): qty 2

## 2023-12-06 MED ORDER — CHLORHEXIDINE GLUCONATE CLOTH 2 % EX PADS
6.0000 | MEDICATED_PAD | Freq: Once | CUTANEOUS | Status: AC
  Administered 2023-12-06: 6 via TOPICAL

## 2023-12-06 MED ORDER — ONDANSETRON HCL 4 MG/2ML IJ SOLN
INTRAMUSCULAR | Status: AC
  Filled 2023-12-06: qty 2

## 2023-12-06 MED ORDER — LEVOTHYROXINE SODIUM 75 MCG PO TABS
75.0000 ug | ORAL_TABLET | Freq: Every day | ORAL | Status: DC
  Administered 2023-12-06 – 2023-12-11 (×6): 75 ug via ORAL
  Filled 2023-12-06 (×5): qty 1
  Filled 2023-12-06: qty 2

## 2023-12-06 MED ORDER — ASPIRIN 81 MG PO CHEW
81.0000 mg | CHEWABLE_TABLET | Freq: Every day | ORAL | Status: DC
  Administered 2023-12-06 – 2023-12-11 (×5): 81 mg via ORAL
  Filled 2023-12-06 (×5): qty 1

## 2023-12-06 MED ORDER — INSULIN GLARGINE-YFGN 100 UNIT/ML ~~LOC~~ SOLN
5.0000 [IU] | Freq: Every day | SUBCUTANEOUS | Status: DC
  Administered 2023-12-07 – 2023-12-10 (×4): 5 [IU] via SUBCUTANEOUS
  Filled 2023-12-06 (×6): qty 0.05

## 2023-12-06 MED ORDER — SENNOSIDES-DOCUSATE SODIUM 8.6-50 MG PO TABS: 1.0000 | ORAL_TABLET | Freq: Every evening | ORAL | Status: DC | PRN

## 2023-12-06 MED ORDER — ALBUTEROL SULFATE (2.5 MG/3ML) 0.083% IN NEBU: 2.5000 mg | INHALATION_SOLUTION | RESPIRATORY_TRACT | Status: DC | PRN

## 2023-12-06 MED ORDER — BISACODYL 5 MG PO TBEC: 5.0000 mg | DELAYED_RELEASE_TABLET | Freq: Every day | ORAL | Status: DC | PRN

## 2023-12-06 MED ORDER — ACETAMINOPHEN 650 MG RE SUPP: 650.0000 mg | Freq: Four times a day (QID) | RECTAL | Status: DC | PRN

## 2023-12-06 MED ORDER — SODIUM CHLORIDE 0.9 % IV SOLN
2.0000 g | INTRAVENOUS | Status: DC
  Administered 2023-12-06 – 2023-12-07 (×2): 2 g via INTRAVENOUS
  Filled 2023-12-06 (×2): qty 12.5

## 2023-12-06 MED ORDER — HYDRALAZINE HCL 25 MG PO TABS
25.0000 mg | ORAL_TABLET | Freq: Three times a day (TID) | ORAL | Status: DC
  Administered 2023-12-06 – 2023-12-07 (×3): 25 mg via ORAL
  Filled 2023-12-06 (×4): qty 1

## 2023-12-06 MED ORDER — ROSUVASTATIN CALCIUM 20 MG PO TABS: 40.0000 mg | ORAL_TABLET | Freq: Every day | ORAL | Status: DC

## 2023-12-06 MED ORDER — VITAMIN D 25 MCG (1000 UNIT) PO TABS
2000.0000 [IU] | ORAL_TABLET | Freq: Every day | ORAL | Status: DC
  Administered 2023-12-06 – 2023-12-11 (×5): 2000 [IU] via ORAL
  Filled 2023-12-06 (×5): qty 2

## 2023-12-06 MED ORDER — INSULIN ASPART 100 UNIT/ML IJ SOLN
0.0000 [IU] | Freq: Every day | INTRAMUSCULAR | Status: DC
  Administered 2023-12-09: 2 [IU] via SUBCUTANEOUS
  Filled 2023-12-06: qty 1

## 2023-12-06 MED ORDER — LINEZOLID 600 MG/300ML IV SOLN
600.0000 mg | Freq: Two times a day (BID) | INTRAVENOUS | Status: DC
  Administered 2023-12-06 – 2023-12-10 (×10): 600 mg via INTRAVENOUS
  Filled 2023-12-06 (×14): qty 300

## 2023-12-06 MED ORDER — INSULIN ASPART 100 UNIT/ML IJ SOLN
0.0000 [IU] | Freq: Three times a day (TID) | INTRAMUSCULAR | Status: DC
  Administered 2023-12-06: 1 [IU] via SUBCUTANEOUS
  Administered 2023-12-07: 2 [IU] via SUBCUTANEOUS
  Administered 2023-12-07: 3 [IU] via SUBCUTANEOUS
  Administered 2023-12-08: 1 [IU] via SUBCUTANEOUS
  Administered 2023-12-08 (×2): 2 [IU] via SUBCUTANEOUS
  Administered 2023-12-09: 3 [IU] via SUBCUTANEOUS
  Administered 2023-12-09: 2 [IU] via SUBCUTANEOUS
  Administered 2023-12-09: 3 [IU] via SUBCUTANEOUS
  Administered 2023-12-10 (×2): 2 [IU] via SUBCUTANEOUS
  Administered 2023-12-10: 5 [IU] via SUBCUTANEOUS
  Filled 2023-12-06 (×12): qty 1

## 2023-12-06 MED ORDER — SODIUM CHLORIDE 0.9 % IV SOLN: INTRAVENOUS | Status: AC

## 2023-12-06 MED ORDER — ATORVASTATIN CALCIUM 40 MG PO TABS
80.0000 mg | ORAL_TABLET | Freq: Every day | ORAL | Status: DC
  Administered 2023-12-08 – 2023-12-11 (×4): 80 mg via ORAL
  Filled 2023-12-06 (×5): qty 2

## 2023-12-06 MED ORDER — METHOCARBAMOL 1000 MG/10ML IJ SOLN
500.0000 mg | Freq: Four times a day (QID) | INTRAMUSCULAR | Status: DC | PRN
  Administered 2023-12-09: 500 mg via INTRAVENOUS
  Filled 2023-12-06: qty 10

## 2023-12-06 MED ORDER — CARVEDILOL 3.125 MG PO TABS
3.1250 mg | ORAL_TABLET | Freq: Two times a day (BID) | ORAL | Status: DC
  Administered 2023-12-06 – 2023-12-11 (×9): 3.125 mg via ORAL
  Filled 2023-12-06 (×9): qty 1

## 2023-12-06 MED ORDER — METRONIDAZOLE 500 MG/100ML IV SOLN
500.0000 mg | Freq: Two times a day (BID) | INTRAVENOUS | Status: DC
  Administered 2023-12-06 – 2023-12-08 (×4): 500 mg via INTRAVENOUS
  Filled 2023-12-06 (×5): qty 100

## 2023-12-06 MED ORDER — MORPHINE SULFATE (PF) 2 MG/ML IV SOLN
2.0000 mg | INTRAVENOUS | Status: DC | PRN
  Administered 2023-12-08 – 2023-12-11 (×4): 2 mg via INTRAVENOUS
  Filled 2023-12-06 (×4): qty 1

## 2023-12-06 MED ORDER — HYDROCODONE-ACETAMINOPHEN 5-325 MG PO TABS
1.0000 | ORAL_TABLET | ORAL | Status: DC | PRN
  Administered 2023-12-07 – 2023-12-08 (×2): 1 via ORAL
  Administered 2023-12-08 (×2): 2 via ORAL
  Administered 2023-12-09 – 2023-12-10 (×4): 1 via ORAL
  Filled 2023-12-06: qty 1
  Filled 2023-12-06: qty 2
  Filled 2023-12-06 (×4): qty 1
  Filled 2023-12-06: qty 2
  Filled 2023-12-06: qty 1
  Filled 2023-12-06: qty 2

## 2023-12-06 MED ORDER — ACETAMINOPHEN 325 MG PO TABS: 650.0000 mg | ORAL_TABLET | Freq: Four times a day (QID) | ORAL | Status: DC | PRN

## 2023-12-06 MED ORDER — INSULIN ASPART 100 UNIT/ML IJ SOLN
2.0000 [IU] | Freq: Three times a day (TID) | INTRAMUSCULAR | Status: DC
  Administered 2023-12-06 – 2023-12-10 (×12): 2 [IU] via SUBCUTANEOUS
  Filled 2023-12-06 (×12): qty 1

## 2023-12-06 NOTE — ED Triage Notes (Addendum)
 Pt arrived POV with c/o right 2nd toe infection that has been on going. Pt received Doxycycline 100 mg bid x 10 days and she was unable to tolerate it. Pt did not take it. Pt has right toes covered. Pt also verbalized left great toe has a wound also.

## 2023-12-06 NOTE — Evaluation (Signed)
 Occupational Therapy Evaluation Patient Details Name: Brianna [name] MRN: [id number] DOB: Feb 28, 1960 Today's Date: 12/06/2023   History of Present Illness   Brianna [name] is a 63 y.o. female with medical history significant of multiple comorbidities including insulin  dependent type 2 Diabetes mellitus, CKD stage IV, on renal transplant list, moderate left-sided carotid artery disease, hyperlipidemia, essential hypertension, amongst other medical problems, who presented the emergency room with chief complaint of worsening right second toe infection.    She said that she was seen by podiatrist in [place], [place]  for right second toe infection and was prescribed doxycycline 1 week back.  She took 4 doses and then started having nausea and vomiting so she could not tolerate it.  She does use insulin  at home for management of diabetes mellitus along with blood pressure medications for management of essential hypertension.  She said that she is on renal transplant list at [facility] and she is supposed to follow-up with them this month.     Clinical Impressions Pt agreeable to OT evaluation. Pt completed bed mobility independently, sit to stand t/f independently, toilet t/f independently, and functional mobility around room independently. She reports that she lives alone on the second floor of an apartment complex. She stated that if she should requires help she has several family members and friends that can provide assist. Pt does not require further OT services at this time.      If plan is discharge home, recommend the following:     PRN assist as needed     Functional Status Assessment   Patient has had a recent decline in their functional status and demonstrates the ability to make significant improvements in function in a reasonable and predictable amount of time.     Equipment Recommendations   None recommended by OT       Precautions/Restrictions   Precautions Precautions: Fall Recall of Precautions/Restrictions: Intact Restrictions Weight Bearing Restrictions Per Provider Order: No     Mobility Bed Mobility Overal bed mobility: Independent                  Transfers Overall transfer level: Independent Equipment used: None                      Balance Overall balance assessment: Independent                                         ADL either performed or assessed with clinical judgement   ADL Overall ADL's : Modified independent                                       General ADL Comments: increased time, seated     Vision Baseline Vision/History: 0 No visual deficits Ability to See in Adequate Light: 0 Adequate Patient Visual Report: No change from baseline Vision Assessment?: No apparent visual deficits            Pertinent Vitals/Pain Pain Assessment Pain Assessment: No/denies pain     Extremity/Trunk Assessment Upper Extremity Assessment Upper Extremity Assessment: Overall WFL for tasks assessed   Lower Extremity Assessment Lower Extremity Assessment: Defer to PT evaluation   Cervical / Trunk Assessment Cervical / Trunk Assessment: Normal   Communication Communication Communication: No apparent difficulties   Cognition Arousal: Alert  Behavior During Therapy: WFL for tasks assessed/performed Cognition: No apparent impairments                               Following commands: Intact       Cueing  General Comments   Cueing Techniques: Verbal cues                 Home Living Family/patient expects to be discharged to:: Private residence Living Arrangements: Alone Available Help at Discharge: Family;Friend(s) Type of Home: Apartment Home Access: Stairs to enter Entrance Stairs-Number of Steps: 20 Entrance Stairs-Rails: Right;Left Home Layout: One level (apartment on second floor)      Bathroom Shower/Tub: Chief Strategy Officer: Standard Bathroom Accessibility: Yes How Accessible: Accessible via walker Home Equipment: Agricultural Consultant (2 wheels)          Prior Functioning/Environment Prior Level of Function : Independent/Modified Independent             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, working, driving ADLs Comments: independent                 OT Goals(Current goals can be found in the care plan section)   Acute Rehab OT Goals Patient Stated Goal: to return home OT Goal Formulation: With patient   AM-PAC OT 6 Clicks Daily Activity     Outcome Measure Help from another person eating meals?: None Help from another person taking care of personal grooming?: None Help from another person toileting, which includes using toliet, bedpan, or urinal?: None Help from another person bathing (including washing, rinsing, drying)?: None Help from another person to put on and taking off regular upper body clothing?: None Help from another person to put on and taking off regular lower body clothing?: None 6 Click Score: 24   End of Session    Activity Tolerance: Patient tolerated treatment well Patient left: in bed;with family/visitor present  OT Visit Diagnosis: Muscle weakness (generalized) (M62.81)                Time: 8391-8379 OT Time Calculation (min): 12 min Charges:  OT General Charges $OT Visit: 1 Visit OT Evaluation $OT Eval Low Complexity: 1 Low   Chiquita LOISE Sermon, OTR/L 12/06/2023, 4:25 PM

## 2023-12-06 NOTE — ED Notes (Signed)
 Pt last ate and drank about 8am this morning.

## 2023-12-06 NOTE — Consult Note (Signed)
 Reason for Consult: Right great toe gangrene Referring Physician: Dr. Dino Corean Dike is an 63 y.o. female.  HPI: Patient is a 63 year old black female with multiple medical problems including end-stage renal disease, on kidney transplant list, diabetes mellitus, hypertension, and carotid artery disease who was started on doxycycline in mid November 2025 for right second toe cellulitis.  She was not able to tolerate the doxycycline.  Her right second toe got worse and she presented to the emergency room for further evaluation and treatment.  She is noted to be hyperglycemic and hypertensive.  She has been nauseated.  She does follow with podiatrist in [place].  She is being followed extensively at [facility] as she is on a transplant list.  Past Medical History:  Diagnosis Date   Diabetes mellitus (HCC)    Gastritis    GERD without esophagitis    Hypertension     Past Surgical History:  Procedure Laterality Date   23 HOUR PH STUDY  01/10/2018   Procedure: 24 HOUR PH STUDY;  Surgeon: Shila Gustav GAILS, MD;  [facility];  Service: Endoscopy;;   CARPAL TUNNEL RELEASE     left   COLONOSCOPY WITH ESOPHAGOGASTRODUODENOSCOPY (EGD) N/A 04/02/2012   SLF: SMALL RECTAL polyp/Small internal hemorrhoids. Polyp hyperplastic. On EGD she had mild gastritis. Biopsy showed chronic and active gastritis and no H. pylori. Next colonoscopy in 10 years with overtube/? Propofol   ESOPHAGEAL DILATION  12/09/2016   Procedure: ESOPHAGEAL DILATION;  Surgeon: Harvey Margo CROME, MD;  [facility];  Service: Endoscopy;;   ESOPHAGEAL MANOMETRY N/A 01/10/2018   Procedure: ESOPHAGEAL MANOMETRY (EM);  Surgeon: Shila Gustav GAILS, MD;  [facility];  Service: Endoscopy;  Laterality: N/A;   ESOPHAGOGASTRODUODENOSCOPY  03/2012   SLF: gastritis   ESOPHAGOGASTRODUODENOSCOPY N/A 12/09/2016   Procedure: ESOPHAGOGASTRODUODENOSCOPY (EGD);  Surgeon: Harvey Margo CROME, MD;  [facility];  Service: Endoscopy;  Laterality: N/A;  11:30am   ESOPHAGOGASTRODUODENOSCOPY N/A 08/22/2017   Procedure: ESOPHAGOGASTRODUODENOSCOPY (EGD);  Surgeon: Harvey Margo CROME, MD;  [facility];  Service: Endoscopy;  Laterality: N/A;  11:30am   PARTIAL HYSTERECTOMY     PH IMPEDANCE STUDY N/A 01/10/2018   Procedure: PH IMPEDANCE STUDY;  Surgeon: Shila Gustav GAILS, MD;  [facility];  Service: Endoscopy;  Laterality: N/A;   SAVORY DILATION N/A 08/22/2017   Procedure: SAVORY DILATION;  Surgeon: Harvey Margo CROME, MD;  [facility];  Service: Endoscopy;  Laterality: N/A;   TRIGGER FINGER RELEASE Left     Family History  Problem Relation Age of Onset   Stroke Mother    Breast cancer Mother    Hypertension Mother    Diabetes Father    Lupus Sister    Hypertension Brother    Diabetes Brother    Stroke Brother    Healthy Son    Healthy Son     Social History:  reports that she has never smoked. She has never used smokeless tobacco. She reports that she does not drink alcohol and does not use drugs.  Allergies:  Allergies  Allergen Reactions   Other Hives and Shortness Of Breath    seafood   Bacitracin    Iodine Hives    shellfish   Tramadol     Neosporin [Neomycin-Bacitracin Zn-Polymyx] Rash    Medications: I have reviewed the patient's current medications. Prior to Admission: (Not in a hospital admission)   Results for orders placed or performed during the hospital  encounter of 12/06/23 (from the past 48 hours)  I-stat chem 8, ED (not at [facility], [facility] or [facility])     Status: Abnormal   Collection Time: 12/06/23  8:43 AM  Result Value Ref Range   Sodium 144 135 - 145 mmol/L   Potassium 4.1 3.5 - 5.1 mmol/L   Chloride 112 (H) 98 - 111 mmol/L   BUN 42 (H) 8 - 23 mg/dL   Creatinine, Ser 6.19 (H) 0.44 - 1.00 mg/dL   Glucose, Bld 724 (H) 70 - 99 mg/dL    Comment: Glucose reference range applies only to samples taken after fasting  for at least 8 hours.   Calcium , Ion 1.17 1.15 - 1.40 mmol/L   TCO2 20 (L) 22 - 32 mmol/L   Hemoglobin 8.5 (L) 12.0 - 15.0 g/dL   HCT 74.9 (L) 63.9 - 53.9 %  CBC with Differential     Status: Abnormal   Collection Time: 12/06/23  8:44 AM  Result Value Ref Range   WBC 9.3 4.0 - 10.5 K/uL   RBC 2.88 (L) 3.87 - 5.11 MIL/uL   Hemoglobin 8.3 (L) 12.0 - 15.0 g/dL   HCT 73.6 (L) 63.9 - 53.9 %   MCV 91.3 80.0 - 100.0 fL   MCH 28.8 26.0 - 34.0 pg   MCHC 31.6 30.0 - 36.0 g/dL   RDW 85.5 88.4 - 84.4 %   Platelets 431 (H) 150 - 400 K/uL   nRBC 0.0 0.0 - 0.2 %   Neutrophils Relative % 71 %   Neutro Abs 6.6 1.7 - 7.7 K/uL   Lymphocytes Relative 16 %   Lymphs Abs 1.5 0.7 - 4.0 K/uL   Monocytes Relative 9 %   Monocytes Absolute 0.9 0.1 - 1.0 K/uL   Eosinophils Relative 2 %   Eosinophils Absolute 0.2 0.0 - 0.5 K/uL   Basophils Relative 1 %   Basophils Absolute 0.1 0.0 - 0.1 K/uL   Immature Granulocytes 1 %   Abs Immature Granulocytes 0.06 0.00 - 0.07 K/uL    Comment: Performed at [facility], [address], [place], [place] [address]  Comprehensive metabolic panel     Status: Abnormal   Collection Time: 12/06/23  8:44 AM  Result Value Ref Range   Sodium 141 135 - 145 mmol/L   Potassium 4.0 3.5 - 5.1 mmol/L   Chloride 109 98 - 111 mmol/L   CO2 18 (L) 22 - 32 mmol/L   Glucose, Bld 273 (H) 70 - 99 mg/dL    Comment: Glucose reference range applies only to samples taken after fasting for at least 8 hours.   BUN 43 (H) 8 - 23 mg/dL   Creatinine, Ser 6.61 (H) 0.44 - 1.00 mg/dL   Calcium  9.4 8.9 - 10.3 mg/dL   Total Protein 8.1 6.5 - 8.1 g/dL   Albumin 3.5 3.5 - 5.0 g/dL   AST 19 15 - 41 U/L   ALT 12 0 - 44 U/L   Alkaline Phosphatase 148 (H) 38 - 126 U/L   Total Bilirubin 0.3 0.0 - 1.2 mg/dL   GFR, Estimated 15 (L) >60 mL/min    Comment: (NOTE) Calculated using the CKD-EPI Creatinine Equation (2021)    Anion gap 14 5 - 15    Comment: Performed at [facility], [address],  [place], [place] [address]  Sedimentation rate     Status: Abnormal   Collection Time: 12/06/23  8:44 AM  Result Value Ref Range   Sed Rate >140 (H) 0 -  30 mm/hr    Comment: Performed at [facility], [address] [place], [place] [address]    DG Foot Complete Right Result Date: 12/06/2023 CLINICAL DATA:  Right second toe infection. EXAM: RIGHT FOOT COMPLETE - 3+ VIEW COMPARISON:  None Available. FINDINGS: There is no evidence of fracture or dislocation. Subtle loss of cortical integrity along the distal aspect of the second distal phalanx is suspicious for early osteomyelitis. No soft tissue gas or foreign body. IMPRESSION: Subtle loss of cortical integrity along the distal aspect of the second distal phalanx is suspicious for early osteomyelitis. Electronically Signed   By: Marcey Moan M.D.   On: 12/06/2023 08:45    ROS:  Pertinent items are noted in HPI.  Blood pressure (!) 235/127, pulse (!) 119, temperature 98.5 F (36.9 C), temperature source Oral, resp. rate 17, height 5' 7 (1.702 m), weight 81.2 kg, SpO2 92%. Physical Exam: Pleasant black female in no acute distress Head is normocephalic, atraumatic Lungs clear to auscultation the breath sounds bilaterally Heart examination reveals a regular rate and rhythm without S3, S4, murmurs Extremity examination reveals bilateral femoral pulses present.  She does have pretibial edema extending to the foot.  She has a mummified right second toe with dry gangrene present.  Her left great toe has a superficial ulceration present on the distal tuft.  I was able to faintly get pedal pulses bilaterally.  She does have femoral pulses bilaterally.  Please see pictures.  Assessment/Plan: Impression: Dry gangrene of right second toe with minimal skin changes of left great toe.  Multiple medical problems including stage IV chronic kidney disease on transplant list, malignant hypertension, carotid artery disease.  Plan: Will get arterial segmental  Doppler study of both legs to further assess the vasculature.  May need vascular referral pending this result.  Further management is pending those results.  Discussed with Dr.Rashid.  Will need to be placed on antibiotics.  Oneil Budge 12/06/2023, 11:50 AM

## 2023-12-06 NOTE — ED Notes (Signed)
 Nurse called to check on bed status for pt. She was told 5 minutes.

## 2023-12-06 NOTE — H&P (Addendum)
 History and Physical    Patient: Brianna [name] FMW:[id number] DOB: February 10, 1960 DOA: 12/06/2023 DOS: the patient was seen and examined on 12/06/2023 PCP: Kristine Corean Deed, NP  Patient coming from: Home  Chief Complaint: Right second toe worsening infection Chief Complaint  Patient presents with   Wound Infection   HPI: Brianna [name] is a 63 y.o. female with medical history significant of multiple comorbidities including insulin  dependent type 2 Diabetes mellitus, CKD stage IV, on renal transplant list, moderate left-sided carotid artery disease, hyperlipidemia, essential hypertension, amongst other medical problems, who presented the emergency room with chief complaint of worsening right second toe infection.   She said that she was seen by podiatrist in [place], [place]  for right second toe infection and was prescribed doxycycline 1 week back.  She took 4 doses and then started having nausea and vomiting so she could not tolerate it.  She does use insulin  at home for management of diabetes mellitus along with blood pressure medications for management of essential hypertension.  She said that she is on renal transplant list at [facility] and she is supposed to follow-up with them this month.  ED physician spoke to on-call general surgeon, Dr. Mavis who evaluated the patient. Both ESR and CRP are elevated. Of the right foot was done which showed evidence of early osteomyelitis of the right second toe distal phalanx.  She said that she lives at home by herself and is independent in activities of daily life.  Review of Systems: As mentioned in the history of present illness. All other systems reviewed and are negative. Past Medical History:  Diagnosis Date   Diabetes mellitus (HCC)    Gastritis    GERD without esophagitis    Hypertension    Past Surgical History:  Procedure Laterality Date   33 HOUR PH STUDY  01/10/2018   Procedure: 24 HOUR PH  STUDY;  Surgeon: Shila Gustav GAILS, MD;  [facility];  Service: Endoscopy;;   CARPAL TUNNEL RELEASE     left   COLONOSCOPY WITH ESOPHAGOGASTRODUODENOSCOPY (EGD) N/A 04/02/2012   SLF: SMALL RECTAL polyp/Small internal hemorrhoids. Polyp hyperplastic. On EGD she had mild gastritis. Biopsy showed chronic and active gastritis and no H. pylori. Next colonoscopy in 10 years with overtube/? Propofol   ESOPHAGEAL DILATION  12/09/2016   Procedure: ESOPHAGEAL DILATION;  Surgeon: Harvey Margo CROME, MD;  [facility];  Service: Endoscopy;;   ESOPHAGEAL MANOMETRY N/A 01/10/2018   Procedure: ESOPHAGEAL MANOMETRY (EM);  Surgeon: Shila Gustav GAILS, MD;  [facility];  Service: Endoscopy;  Laterality: N/A;   ESOPHAGOGASTRODUODENOSCOPY  03/2012   SLF: gastritis   ESOPHAGOGASTRODUODENOSCOPY N/A 12/09/2016   Procedure: ESOPHAGOGASTRODUODENOSCOPY (EGD);  Surgeon: Harvey Margo CROME, MD;  [facility];  Service: Endoscopy;  Laterality: N/A;  11:30am   ESOPHAGOGASTRODUODENOSCOPY N/A 08/22/2017   Procedure: ESOPHAGOGASTRODUODENOSCOPY (EGD);  Surgeon: Harvey Margo CROME, MD;  [facility];  Service: Endoscopy;  Laterality: N/A;  11:30am   PARTIAL HYSTERECTOMY     PH IMPEDANCE STUDY N/A 01/10/2018   Procedure: PH IMPEDANCE STUDY;  Surgeon: Shila Gustav GAILS, MD;  [facility];  Service: Endoscopy;  Laterality: N/A;   SAVORY DILATION N/A 08/22/2017   Procedure: SAVORY DILATION;  Surgeon: Harvey Margo CROME, MD;  [facility];  Service: Endoscopy;  Laterality: N/A;   TRIGGER FINGER RELEASE Left    Social History:  reports that she has never smoked. She has never used smokeless tobacco. She reports that she  does not drink alcohol and does not use drugs.  Allergies  Allergen Reactions   Other Hives and Shortness Of Breath    seafood   Bacitracin    Iodine Hives    shellfish   Tramadol     Neosporin [Neomycin-Bacitracin Zn-Polymyx] Rash     Family History  Problem Relation Age of Onset   Stroke Mother    Breast cancer Mother    Hypertension Mother    Diabetes Father    Lupus Sister    Hypertension Brother    Diabetes Brother    Stroke Brother    Healthy Son    Healthy Son     Prior to Admission medications   Medication Sig Start Date End Date Taking? Authorizing Provider  acetaminophen  (TYLENOL ) 500 MG tablet Take 500 mg by mouth every 6 (six) hours as needed for moderate pain.   Yes [provider]  amitriptyline  (ELAVIL ) 50 MG tablet TAKE 1 TABLET BY MOUTH AT BEDTIME 07/30/18  Yes Ezzard Sonny RAMAN, PA-C  ammonium lactate (AMLACTIN) 12 % cream APPLY CREAM TOPICALLY TO AFFECTED AREA ONCE DAILY   Yes [provider]  ascorbic acid (VITAMIN C) 500 MG tablet Take 500 mg by mouth daily.   Yes [provider]  aspirin  81 MG chewable tablet Chew 81 mg by mouth daily.   Yes [provider]  atorvastatin  (LIPITOR) 80 MG tablet Take 1 tablet (80 mg total) by mouth daily. 11/03/22  Yes O'Neal, Darryle Ned, MD  carvedilol  (COREG ) 3.125 MG tablet Take 1 tablet (3.125 mg total) by mouth 2 (two) times daily with a meal. 06/30/23  Yes Monge, Damien BROCKS, NP  cholecalciferol (VITAMIN D3) 25 MCG (1000 UT) tablet Take 2 tablets (2,000 Units total) by mouth daily. 12/19/18  Yes Fields, Sandi L, MD  collagenase (SANTYL) 250 UNIT/GM ointment  12/04/20  Yes [provider]  desonide (DESOWEN) 0.05 % cream Apply 1 Application topically 3 (three) times daily. For 1 days 12/13/11  Yes [provider]  doxycycline (VIBRAMYCIN) 100 MG capsule Take 100 mg by mouth 2 (two) times daily. 11/15/23  Yes [provider]  gabapentin  (NEURONTIN ) 300 MG capsule Take 300 mg by mouth 3 (three) times daily.   Yes [provider]  hydrOXYzine (ATARAX) 25 MG tablet Take 1 tablet by mouth daily as needed. 05/24/22  Yes [provider]  Insulin  Aspart (NOVOLOG  [place]) Inject 10-16 Units into  the skin See admin instructions.   Yes [provider]  levothyroxine (SYNTHROID) 75 MCG tablet Take 75 mcg by mouth daily.   Yes [provider]  omeprazole  (PRILOSEC OTC) 20 MG tablet Take 20 mg by mouth daily. For 14 days   Yes [provider]  ondansetron (ZOFRAN-ODT) 4 MG disintegrating tablet DISSOLVE 1 TABLET IN MOUTH EVERY 8 HOURS AS NEEDED FOR NAUSEA.   Yes [provider]  pantoprazole  (PROTONIX ) 40 MG tablet 40 mg by oral route.   Yes [provider]  polyethylene glycol (MIRALAX / GLYCOLAX) packet Take 17 g by mouth daily as needed for mild constipation.    Yes [provider]  pregabalin (LYRICA) 75 MG capsule Take 75 mg by mouth 2 (two) times daily.   Yes [provider]  rosuvastatin  (CRESTOR ) 40 MG tablet Take 1 tablet by mouth daily.   Yes [provider]  silver sulfADIAZINE (SSD) 1 % cream  07/10/20  Yes [provider]  TRELEGY ELLIPTA 100-62.5-25 MCG/ACT AEPB Inhale 1 puff into the lungs  daily. 11/01/23  Yes [provider]  triamcinolone cream (KENALOG) 0.1 % APPLY CREAM EXTERNALLY TO AFFECTED AREA TWICE A WEEK   Yes [provider]  Zinc 100 MG TABS Take 1 tablet by mouth daily.   Yes [provider]  Blood Glucose Monitoring Suppl (ONETOUCH VERIO) w/Device KIT 1 each by Does not apply route as needed. 08/20/19   Nida, Gebreselassie W, MD  glucose blood (ONETOUCH VERIO) test strip Use as instructed 08/20/19   Nida, Gebreselassie W, MD  glucose blood test strip One touch verio glucose test strip.  Use as instructed 03/21/20   Triplett, Tammy, PA-C  SOLIQUA 100-33 UNT-MCG/ML SOPN INJECT 35-45 UNITS SUBCUTANEOUSLY ONCE DAILY TITRATING DOSE    [provider]    Physical Exam: Vitals:   12/06/23 0804 12/06/23 0805 12/06/23 0815 12/06/23 0845  BP:  (!) 190/105 (!) 192/112 (!) 219/116  Pulse:  100 (!) 102 (!) 107  Resp: 17     Temp: 98.5 F (36.9 C)     TempSrc:  Oral     SpO2:  98% 97% 97%  Weight:      Height:       Constitutional: NAD, calm, comfortable Eyes: PERRL, lids and conjunctivae normal ENMT: Mucous membranes are moist. Posterior pharynx clear of any exudate or lesions.Normal dentition.  Neck: normal, supple, no masses, no thyromegaly Respiratory: clear to auscultation bilaterally, no wheezing, no crackles. Normal respiratory effort. No accessory muscle use.  Cardiovascular: Regular rate and rhythm, no murmurs / rubs / gallops. No extremity edema. 2+ pedal pulses. No carotid bruits.  Abdomen: no tenderness, no masses palpated. No hepatosplenomegaly. Bowel sounds positive.  Musculoskeletal: Evidence of right second toe dry gangrene, there is evidence of blackish discoloration of the left great toe as well as ulceration present on the plantar aspect of left great toe Neurologic: CN 2-12 grossly intact. Sensation intact, DTR normal. Strength 5/5 x all 4 extremities.  Psychiatric: Normal judgment and insight. Alert and oriented x 3. Normal mood.   Data Reviewed:  There are no new results to review at this time.  Assessment and Plan:  Dry gangrene of right second toe,foul smelling,POA: Osteomyelitis of second right toe,POA: Mild b/l LE peripheral arterial disease,POA: -I have ordered IV Linezolid, Flagyl and Cefepime -ESR and CRP highly elevated -Discussed with General surgeon, Dr Mavis and plan is to initiate IV antibiotics, get an arterial doppler of lower extremities and surgical amputation of right second toe on Friday. US  arterial duplex showed Left ABI 0.89 and right ABI 0.92, consistent with mild peripheral arterial disease in both lower extremities, Monophasic distal arterial waveforms in both lower extremities, suggesting distal arterial insufficiency.  Hypertensive urgency,POA: -SBPs in early 200s in ED -Ordered coreg  3.125 po BID and hydralazine 25 mg PO Q8H -Ordered IV hydralazine 10 mg prn for SBP > 160 mmHg.  Insulin   dependent type 2 diabetes mellitus complicated by lower extremity peripheral neuropathy, diabetic nephropathy as well as diabetic foot infection,POA: -Ordered SSI, long acting as well as short acting scheduled pre meal insulin  F/u HbA1c -Resumed gabapentin  300 mg PO TID for neuropathy symptoms.  Left-sided moderate carotid artery stenosis, POA: Continue with aspirin  and statin.  Outpatient follow-up with vascular surgeon.  CKD IV,POA: -On renal transplant list at [facility]   Disposition: Home     Advance Care Planning:   Code Status: Not on file full code  Consults: General surgery  Family Communication: Son on the phone  Severity of Illness: The appropriate patient status  for this patient is INPATIENT. Inpatient status is judged to be reasonable and necessary in order to provide the required intensity of service to ensure the patient's safety. The patient's presenting symptoms, physical exam findings, and initial radiographic and laboratory data in the context of their chronic comorbidities is felt to place them at high risk for further clinical deterioration. Furthermore, it is not anticipated that the patient will be medically stable for discharge from the hospital within 2 midnights of admission.   * I certify that at the point of admission it is my clinical judgment that the patient will require inpatient hospital care spanning beyond 2 midnights from the point of admission due to high intensity of service, high risk for further deterioration and high frequency of surveillance required.*  Author: Deliliah Room, MD 12/06/2023 9:47 AM  For on call review [email or web address].

## 2023-12-06 NOTE — H&P (View-Only) (Signed)
 Reason for Consult: Right great toe gangrene Referring Physician: Dr. Dino Corean [name] is an 63 y.o. female.  HPI: Patient is a 63 year old black female with multiple medical problems including end-stage renal disease, on kidney transplant list, diabetes mellitus, hypertension, and carotid artery disease who was started on doxycycline in mid November 2025 for right second toe cellulitis.  She was not able to tolerate the doxycycline.  Her right second toe got worse and she presented to the emergency room for further evaluation and treatment.  She is noted to be hyperglycemic and hypertensive.  She has been nauseated.  She does follow with podiatrist in [place].  She is being followed extensively at [facility] as she is on a transplant list.  Past Medical History:  Diagnosis Date   Diabetes mellitus (HCC)    Gastritis    GERD without esophagitis    Hypertension     Past Surgical History:  Procedure Laterality Date   23 HOUR PH STUDY  01/10/2018   Procedure: 24 HOUR PH STUDY;  Surgeon: Brianna Gustav GAILS, MD;  Location: [facility];  Service: Endoscopy;;   CARPAL TUNNEL RELEASE     left   COLONOSCOPY WITH ESOPHAGOGASTRODUODENOSCOPY (EGD) N/A 04/02/2012   SLF: SMALL RECTAL polyp/Small internal hemorrhoids. Polyp hyperplastic. On EGD she had mild gastritis. Biopsy showed chronic and active gastritis and no H. pylori. Next colonoscopy in 10 years with overtube/? Propofol   ESOPHAGEAL DILATION  12/09/2016   Procedure: ESOPHAGEAL DILATION;  Surgeon: Brianna Margo CROME, MD;  Location: [facility];  Service: Endoscopy;;   ESOPHAGEAL MANOMETRY N/A 01/10/2018   Procedure: ESOPHAGEAL MANOMETRY (EM);  Surgeon: Brianna Gustav GAILS, MD;  Location: [facility];  Service: Endoscopy;  Laterality: N/A;   ESOPHAGOGASTRODUODENOSCOPY  03/2012   SLF: gastritis   ESOPHAGOGASTRODUODENOSCOPY N/A 12/09/2016   Procedure: ESOPHAGOGASTRODUODENOSCOPY (EGD);  Surgeon: Brianna Margo CROME, MD;  [facility];  Service: Endoscopy;  Laterality: N/A;  11:30am   ESOPHAGOGASTRODUODENOSCOPY N/A 08/22/2017   Procedure: ESOPHAGOGASTRODUODENOSCOPY (EGD);  Surgeon: Brianna Margo CROME, MD;  [facility];  Service: Endoscopy;  Laterality: N/A;  11:30am   PARTIAL HYSTERECTOMY     PH IMPEDANCE STUDY N/A 01/10/2018   Procedure: PH IMPEDANCE STUDY;  Surgeon: Brianna Gustav GAILS, MD;  Location: [facility];  Service: Endoscopy;  Laterality: N/A;   SAVORY DILATION N/A 08/22/2017   Procedure: SAVORY DILATION;  Surgeon: Brianna Margo CROME, MD;  [facility];  Service: Endoscopy;  Laterality: N/A;   TRIGGER FINGER RELEASE Left     Family History  Problem Relation Age of Onset   Stroke Mother    Breast cancer Mother    Hypertension Mother    Diabetes Father    Lupus Sister    Hypertension Brother    Diabetes Brother    Stroke Brother    Healthy Son    Healthy Son     Social History:  reports that she has never smoked. She has never used smokeless tobacco. She reports that she does not drink alcohol and does not use drugs.  Allergies:  Allergies  Allergen Reactions   Other Hives and Shortness Of Breath    seafood   Bacitracin    Iodine Hives    shellfish   Tramadol     Neosporin [Neomycin-Bacitracin Zn-Polymyx] Rash    Medications: I have reviewed the patient's current medications. Prior to Admission: (Not in a hospital admission)   Results for orders placed or performed during the hospital  encounter of 12/06/23 (from the past 48 hours)  I-stat chem 8, ED (not at [facility], [facility] or [facility])     Status: Abnormal   Collection Time: 12/06/23  8:43 AM  Result Value Ref Range   Sodium 144 135 - 145 mmol/L   Potassium 4.1 3.5 - 5.1 mmol/L   Chloride 112 (H) 98 - 111 mmol/L   BUN 42 (H) 8 - 23 mg/dL   Creatinine, Ser 6.19 (H) 0.44 - 1.00 mg/dL   Glucose, Bld 724 (H) 70 - 99 mg/dL    Comment: Glucose reference range applies only to samples taken after fasting  for at least 8 hours.   Calcium , Ion 1.17 1.15 - 1.40 mmol/L   TCO2 20 (L) 22 - 32 mmol/L   Hemoglobin 8.5 (L) 12.0 - 15.0 g/dL   HCT 74.9 (L) 63.9 - 53.9 %  CBC with Differential     Status: Abnormal   Collection Time: 12/06/23  8:44 AM  Result Value Ref Range   WBC 9.3 4.0 - 10.5 K/uL   RBC 2.88 (L) 3.87 - 5.11 MIL/uL   Hemoglobin 8.3 (L) 12.0 - 15.0 g/dL   HCT 73.6 (L) 63.9 - 53.9 %   MCV 91.3 80.0 - 100.0 fL   MCH 28.8 26.0 - 34.0 pg   MCHC 31.6 30.0 - 36.0 g/dL   RDW 85.5 88.4 - 84.4 %   Platelets 431 (H) 150 - 400 K/uL   nRBC 0.0 0.0 - 0.2 %   Neutrophils Relative % 71 %   Neutro Abs 6.6 1.7 - 7.7 K/uL   Lymphocytes Relative 16 %   Lymphs Abs 1.5 0.7 - 4.0 K/uL   Monocytes Relative 9 %   Monocytes Absolute 0.9 0.1 - 1.0 K/uL   Eosinophils Relative 2 %   Eosinophils Absolute 0.2 0.0 - 0.5 K/uL   Basophils Relative 1 %   Basophils Absolute 0.1 0.0 - 0.1 K/uL   Immature Granulocytes 1 %   Abs Immature Granulocytes 0.06 0.00 - 0.07 K/uL    Comment: Performed at [facility], [address], [place], [place] [address]  Comprehensive metabolic panel     Status: Abnormal   Collection Time: 12/06/23  8:44 AM  Result Value Ref Range   Sodium 141 135 - 145 mmol/L   Potassium 4.0 3.5 - 5.1 mmol/L   Chloride 109 98 - 111 mmol/L   CO2 18 (L) 22 - 32 mmol/L   Glucose, Bld 273 (H) 70 - 99 mg/dL    Comment: Glucose reference range applies only to samples taken after fasting for at least 8 hours.   BUN 43 (H) 8 - 23 mg/dL   Creatinine, Ser 6.61 (H) 0.44 - 1.00 mg/dL   Calcium  9.4 8.9 - 10.3 mg/dL   Total Protein 8.1 6.5 - 8.1 g/dL   Albumin 3.5 3.5 - 5.0 g/dL   AST 19 15 - 41 U/L   ALT 12 0 - 44 U/L   Alkaline Phosphatase 148 (H) 38 - 126 U/L   Total Bilirubin 0.3 0.0 - 1.2 mg/dL   GFR, Estimated 15 (L) >60 mL/min    Comment: (NOTE) Calculated using the CKD-EPI Creatinine Equation (2021)    Anion gap 14 5 - 15    Comment: Performed at [facility], [address],  [place], [place] [address]  Sedimentation rate     Status: Abnormal   Collection Time: 12/06/23  8:44 AM  Result Value Ref Range   Sed Rate >140 (H) 0 -  30 mm/hr    Comment: Performed at [facility], [address] [place], [place] [address]    DG Foot Complete Right Result Date: 12/06/2023 CLINICAL DATA:  Right second toe infection. EXAM: RIGHT FOOT COMPLETE - 3+ VIEW COMPARISON:  None Available. FINDINGS: There is no evidence of fracture or dislocation. Subtle loss of cortical integrity along the distal aspect of the second distal phalanx is suspicious for early osteomyelitis. No soft tissue gas or foreign body. IMPRESSION: Subtle loss of cortical integrity along the distal aspect of the second distal phalanx is suspicious for early osteomyelitis. Electronically Signed   By: Marcey Moan M.D.   On: 12/06/2023 08:45    ROS:  Pertinent items are noted in HPI.  Blood pressure (!) 235/127, pulse (!) 119, temperature 98.5 F (36.9 C), temperature source Oral, resp. rate 17, height 5' 7 (1.702 m), weight 81.2 kg, SpO2 92%. Physical Exam: Pleasant black female in no acute distress Head is normocephalic, atraumatic Lungs clear to auscultation the breath sounds bilaterally Heart examination reveals a regular rate and rhythm without S3, S4, murmurs Extremity examination reveals bilateral femoral pulses present.  She does have pretibial edema extending to the foot.  She has a mummified right second toe with dry gangrene present.  Her left great toe has a superficial ulceration present on the distal tuft.  I was able to faintly get pedal pulses bilaterally.  She does have femoral pulses bilaterally.  Please see pictures.  Assessment/Plan: Impression: Dry gangrene of right second toe with minimal skin changes of left great toe.  Multiple medical problems including stage IV chronic kidney disease on transplant list, malignant hypertension, carotid artery disease.  Plan: Will get arterial segmental  Doppler study of both legs to further assess the vasculature.  May need vascular referral pending this result.  Further management is pending those results.  Discussed with Dr.Rashid.  Will need to be placed on antibiotics.  Brianna [name] 12/06/2023, 11:50 AM

## 2023-12-06 NOTE — ED Provider Notes (Signed)
 [facility] EMERGENCY DEPARTMENT AT [facility] Provider Note   CSN: [id number] Arrival date & time: 12/06/23  9263     Patient presents with: Wound Infection   Brianna [name] is a 63 y.o. female.   63 year old female history of carotid artery disease, hypertension, hyperlipidemia, DM2, and stage IV CKD awaiting renal transplant who presents emergency department with toe wound.  Has been having a wound on her right second toe since mid November.  Says that it has been foul-smelling and started changing colors.  Not painful.  Was prescribed doxycycline on 11/15/2023 but reports that it made her nauseous and she has only taken a couple of pills.  No fevers or chills.  Also has a wound on her left great toe.  Follows with podiatry in [place] for this.       Prior to Admission medications   Medication Sig Start Date End Date Taking? Authorizing Provider  acetaminophen  (TYLENOL ) 500 MG tablet Take 500 mg by mouth every 6 (six) hours as needed for moderate pain.   Yes [provider]  amitriptyline  (ELAVIL ) 50 MG tablet TAKE 1 TABLET BY MOUTH AT BEDTIME 07/30/18  Yes Ezzard Sonny RAMAN, PA-C  ammonium lactate (AMLACTIN) 12 % cream APPLY CREAM TOPICALLY TO AFFECTED AREA ONCE DAILY   Yes [provider]  ascorbic acid (VITAMIN C) 500 MG tablet Take 500 mg by mouth daily.   Yes [provider]  aspirin  81 MG chewable tablet Chew 81 mg by mouth daily.   Yes [provider]  atorvastatin  (LIPITOR) 80 MG tablet Take 1 tablet (80 mg total) by mouth daily. 11/03/22  Yes O'Neal, Darryle Ned, MD  carvedilol  (COREG ) 3.125 MG tablet Take 1 tablet (3.125 mg total) by mouth 2 (two) times daily with a meal. 06/30/23  Yes Monge, Damien BROCKS, NP  cholecalciferol (VITAMIN D3) 25 MCG (1000 UT) tablet Take 2 tablets (2,000 Units total) by mouth daily. 12/19/18  Yes Fields, Sandi L, MD  collagenase (SANTYL) 250 UNIT/GM ointment  12/04/20  Yes [provider]   desonide (DESOWEN) 0.05 % cream Apply 1 Application topically 3 (three) times daily. For 1 days 12/13/11  Yes [provider]  doxycycline (VIBRAMYCIN) 100 MG capsule Take 100 mg by mouth 2 (two) times daily. 11/15/23  Yes [provider]  gabapentin  (NEURONTIN ) 300 MG capsule Take 300 mg by mouth 3 (three) times daily.   Yes [provider]  hydrOXYzine (ATARAX) 25 MG tablet Take 1 tablet by mouth daily as needed. 05/24/22  Yes [provider]  Insulin  Aspart (NOVOLOG  [place]) Inject 10-16 Units into the skin See admin instructions.   Yes [provider]  levothyroxine (SYNTHROID) 75 MCG tablet Take 75 mcg by mouth daily.   Yes [provider]  omeprazole  (PRILOSEC OTC) 20 MG tablet Take 20 mg by mouth daily. For 14 days   Yes [provider]  ondansetron (ZOFRAN-ODT) 4 MG disintegrating tablet DISSOLVE 1 TABLET IN MOUTH EVERY 8 HOURS AS NEEDED FOR NAUSEA.   Yes [provider]  pantoprazole  (PROTONIX ) 40 MG tablet 40 mg by oral route.   Yes [provider]  polyethylene glycol (MIRALAX / GLYCOLAX) packet Take 17 g by mouth daily as needed for mild constipation.    Yes [provider]  pregabalin (LYRICA) 75 MG capsule Take 75 mg by mouth 2 (two) times daily.   Yes [provider]  rosuvastatin  (CRESTOR ) 40 MG tablet Take 1 tablet by mouth daily.  Yes [provider]  silver sulfADIAZINE (SSD) 1 % cream  07/10/20  Yes [provider]  TRELEGY ELLIPTA 100-62.5-25 MCG/ACT AEPB Inhale 1 puff into the lungs daily. 11/01/23  Yes [provider]  triamcinolone cream (KENALOG) 0.1 % APPLY CREAM EXTERNALLY TO AFFECTED AREA TWICE A WEEK   Yes [provider]  Zinc 100 MG TABS Take 1 tablet by mouth daily.   Yes [provider]  Blood Glucose Monitoring Suppl (ONETOUCH VERIO) w/Device KIT 1 each by Does not apply route as needed. 08/20/19   Nida, Gebreselassie W, MD   glucose blood (ONETOUCH VERIO) test strip Use as instructed 08/20/19   Nida, Gebreselassie W, MD  glucose blood test strip One touch verio glucose test strip.  Use as instructed 03/21/20   Triplett, Tammy, PA-C  SOLIQUA 100-33 UNT-MCG/ML SOPN INJECT 35-45 UNITS SUBCUTANEOUSLY ONCE DAILY TITRATING DOSE    [provider]    Allergies: Other, Bacitracin, Iodine, Tramadol , and Neosporin [neomycin-bacitracin zn-polymyx]    Review of Systems  Updated Vital Signs BP (!) 219/116   Pulse (!) 107   Temp 98.5 F (36.9 C) (Oral)   Resp 17   Ht 5' 7 (1.702 m)   Wt 81.2 kg   SpO2 97%   BMI 28.04 kg/m   Physical Exam Cardiovascular:     Comments: DP pulses 2+ bilaterally.  Cap refill less than 2 seconds in all toes except for the right second toe and the great toe of the left foot. Musculoskeletal:     Comments: Dry gangrene of the right second toe.  See image below.  Left great toe with small ulcer without drainage.  See image below.    Right foot:     Left foot:    (all labs ordered are listed, but only abnormal results are displayed) Labs Reviewed  CBC WITH DIFFERENTIAL/PLATELET - Abnormal; Notable for the following components:      Result Value   RBC 2.88 (*)    Hemoglobin 8.3 (*)    HCT 26.3 (*)    Platelets 431 (*)    All other components within normal limits  COMPREHENSIVE METABOLIC PANEL WITH GFR - Abnormal; Notable for the following components:   CO2 18 (*)    Glucose, Bld 273 (*)    BUN 43 (*)    Creatinine, Ser 3.38 (*)    Alkaline Phosphatase 148 (*)    GFR, Estimated 15 (*)    All other components within normal limits  SEDIMENTATION RATE - Abnormal; Notable for the following components:   Sed Rate >140 (*)    All other components within normal limits  I-STAT CHEM 8, ED - Abnormal; Notable for the following components:   Chloride 112 (*)    BUN 42 (*)    Creatinine, Ser 3.80 (*)    Glucose, Bld 275 (*)    TCO2 20 (*)    Hemoglobin 8.5 (*)    HCT  25.0 (*)    All other components within normal limits  CULTURE, BLOOD (ROUTINE X 2)  CULTURE, BLOOD (ROUTINE X 2)  C-REACTIVE PROTEIN    EKG: None  Radiology: DG Foot Complete Right Result Date: 12/06/2023 CLINICAL DATA:  Right second toe infection. EXAM: RIGHT FOOT COMPLETE - 3+ VIEW COMPARISON:  None Available. FINDINGS: There is no evidence of fracture or dislocation. Subtle loss of cortical integrity along the distal aspect of the second distal phalanx is suspicious for early osteomyelitis. No soft tissue gas or foreign body. IMPRESSION:  Subtle loss of cortical integrity along the distal aspect of the second distal phalanx is suspicious for early osteomyelitis. Electronically Signed   By: Marcey Moan M.D.   On: 12/06/2023 08:45     Procedures   Medications Ordered in the ED - No data to display  Clinical Course as of 12/06/23 0951  Wed Dec 06, 2023  9076 Discussed with Dr Onesimo from ortho who states that at this facility general surgery typically performs these surgeries for dry gangrene [RP]  0928 Dr Mavis from general surgery consulted.  [RP]  [phone] Dw Dr Dino from hospitalist [RP]    Clinical Course User Index [RP] Yolande Lamar BROCKS, MD                                 Medical Decision Making Amount and/or Complexity of Data Reviewed Labs: ordered. Radiology: ordered.  Risk Decision regarding hospitalization.   Tokiko Diefenderfer is a 63 year old female history of carotid artery disease, hypertension, hyperlipidemia, diabetes, and stage IV CKD who presents to the emergency department with an infected toe  Initial Ddx:  Dry gangrene, osteomyelitis, necrotizing fasciitis, cellulitis, sepsis, limb ischemia  MDM/Course:  Patient presents emergency department with a toe wound.  Appears to be dry gangrene now.  I do not see any surrounding cellulitis.  She is not having any infectious symptoms.  Has failed outpatient treatment with her podiatrist and  doxycycline.  Also has a toe wound on the left side as well but does not appear to be grossly infected.  She is afebrile not septic.  Appears to have good pulses to her foot currently.  X-ray of the foot shows signs of osteo.  Labs returned and are consistent with her history of CKD.  WBC 9.3.  ESR undetectably high.  Upon re-evaluation patient remained stable.  Discussed with surgery due to concerns for osteomyelitis and dry gangrene that would warrant amputation.  They recommended admission to hospitalist.  Will hold off on antibiotics at this point in time until able to get a bone culture  This patient presents to the ED for concern of complaints listed in HPI, this involves an extensive number of treatment options, and is a complaint that carries with it a high risk of complications and morbidity. Disposition including potential need for admission considered.   Dispo: Admit to Floor  Additional history obtained from son Records reviewed Outpatient Clinic Notes The following labs were independently interpreted: Chemistry and show CKD I independently reviewed the following imaging with scope of interpretation limited to determining acute life threatening conditions related to emergency care: Extremity x-ray(s) and agree with the radiologist interpretation with the following exceptions: none I personally reviewed and interpreted cardiac monitoring: normal sinus rhythm  I personally reviewed and interpreted the pt's EKG: see above for interpretation  I have reviewed the patients home medications and made adjustments as needed Consults: General Surgery and Hospitalist  Portions of this note were generated with Scientist, clinical (histocompatibility and immunogenetics). Dictation errors may occur despite best attempts at proofreading.     Final diagnoses:  Dry gangrene (HCC)  Osteomyelitis of second toe of right foot [facility])    ED Discharge Orders     None          Yolande Lamar BROCKS, MD 12/06/23 [phone]

## 2023-12-07 ENCOUNTER — Encounter (HOSPITAL_COMMUNITY): Admission: EM | Disposition: A | Payer: Self-pay | Source: Home / Self Care | Attending: Internal Medicine

## 2023-12-07 ENCOUNTER — Inpatient Hospital Stay (HOSPITAL_COMMUNITY): Admitting: Certified Registered"

## 2023-12-07 ENCOUNTER — Encounter (HOSPITAL_COMMUNITY): Payer: Self-pay | Admitting: Internal Medicine

## 2023-12-07 DIAGNOSIS — M869 Osteomyelitis, unspecified: Secondary | ICD-10-CM | POA: Diagnosis not present

## 2023-12-07 HISTORY — PX: AMPUTATION TOE: SHX6595

## 2023-12-07 LAB — GLUCOSE, CAPILLARY
Glucose-Capillary: 168 mg/dL — ABNORMAL HIGH (ref 70–99)
Glucose-Capillary: 174 mg/dL — ABNORMAL HIGH (ref 70–99)
Glucose-Capillary: 219 mg/dL — ABNORMAL HIGH (ref 70–99)
Glucose-Capillary: 210 mg/dL — ABNORMAL HIGH (ref 70–99)
Glucose-Capillary: 177 mg/dL — ABNORMAL HIGH (ref 70–99)

## 2023-12-07 LAB — HIV ANTIBODY (ROUTINE TESTING W REFLEX): HIV Screen 4th Generation wRfx: NONREACTIVE

## 2023-12-07 LAB — BASIC METABOLIC PANEL WITH GFR
Anion gap: 10 (ref 5–15)
BUN: 43 mg/dL — ABNORMAL HIGH (ref 8–23)
CO2: 21 mmol/L — ABNORMAL LOW (ref 22–32)
Calcium: 8.7 mg/dL — ABNORMAL LOW (ref 8.9–10.3)
Chloride: 110 mmol/L (ref 98–111)
Creatinine, Ser: 3.83 mg/dL — ABNORMAL HIGH (ref 0.44–1.00)
GFR, Estimated: 13 mL/min — ABNORMAL LOW (ref >60)
Glucose, Bld: 204 mg/dL — ABNORMAL HIGH (ref 70–99)
Potassium: 4.2 mmol/L (ref 3.5–5.1)
Sodium: 141 mmol/L (ref 135–145)

## 2023-12-07 LAB — TROPONIN T, HIGH SENSITIVITY
Troponin T High Sensitivity: 80 ng/L — ABNORMAL HIGH (ref 0–19)
Troponin T High Sensitivity: 84 ng/L — ABNORMAL HIGH (ref 0–19)

## 2023-12-07 LAB — CBC
HCT: 23.5 % — ABNORMAL LOW (ref 36.0–46.0)
Hemoglobin: 7.4 g/dL — ABNORMAL LOW (ref 12.0–15.0)
MCH: 28.9 pg (ref 26.0–34.0)
MCHC: 31.5 g/dL (ref 30.0–36.0)
MCV: 91.8 fL (ref 80.0–100.0)
Platelets: 365 K/uL (ref 150–400)
RBC: 2.56 MIL/uL — ABNORMAL LOW (ref 3.87–5.11)
RDW: 14.6 % (ref 11.5–15.5)
WBC: 8 K/uL (ref 4.0–10.5)
nRBC: 0 % (ref 0.0–0.2)

## 2023-12-07 LAB — SURGICAL PCR SCREEN
MRSA, PCR: NEGATIVE
Staphylococcus aureus: NEGATIVE

## 2023-12-07 SURGERY — AMPUTATION, TOE
Anesthesia: Regional | Site: Toe | Laterality: Right

## 2023-12-07 SURGERY — AMPUTATION, TOE
Anesthesia: General | Site: Toe | Laterality: Right

## 2023-12-07 MED ORDER — ADULT MULTIVITAMIN W/MINERALS CH
1.0000 | ORAL_TABLET | Freq: Every day | ORAL | Status: DC
  Administered 2023-12-07 – 2023-12-11 (×5): 1 via ORAL
  Filled 2023-12-07 (×5): qty 1

## 2023-12-07 MED ORDER — FENTANYL CITRATE (PF) 100 MCG/2ML IJ SOLN
INTRAMUSCULAR | Status: AC
  Filled 2023-12-07: qty 2

## 2023-12-07 MED ORDER — PANTOPRAZOLE SODIUM 40 MG IV SOLR
40.0000 mg | Freq: Every day | INTRAVENOUS | Status: DC
  Administered 2023-12-07 – 2023-12-11 (×5): 40 mg via INTRAVENOUS
  Filled 2023-12-07 (×5): qty 10

## 2023-12-07 MED ORDER — LIDOCAINE HCL (PF) 1 % IJ SOLN
INTRAMUSCULAR | Status: AC
  Filled 2023-12-07: qty 30

## 2023-12-07 MED ORDER — PROCHLORPERAZINE EDISYLATE 10 MG/2ML IJ SOLN
10.0000 mg | INTRAMUSCULAR | Status: AC
  Administered 2023-12-07: 10 mg via INTRAVENOUS
  Filled 2023-12-07: qty 2

## 2023-12-07 MED ORDER — LACTATED RINGERS IV SOLN: INTRAVENOUS | Status: DC | PRN

## 2023-12-07 MED ORDER — ORAL CARE MOUTH RINSE: 15.0000 mL | Freq: Once | OROMUCOSAL | Status: DC

## 2023-12-07 MED ORDER — MIDAZOLAM HCL 2 MG/2ML IJ SOLN
INTRAMUSCULAR | Status: AC
  Filled 2023-12-07: qty 2

## 2023-12-07 MED ORDER — MIDAZOLAM HCL 5 MG/5ML IJ SOLN
INTRAMUSCULAR | Status: DC | PRN
  Administered 2023-12-07: 2 mg via INTRAVENOUS

## 2023-12-07 MED ORDER — CHLORHEXIDINE GLUCONATE 0.12 % MT SOLN: 15.0000 mL | Freq: Once | OROMUCOSAL | Status: DC

## 2023-12-07 MED ORDER — FENTANYL CITRATE (PF) 100 MCG/2ML IJ SOLN
INTRAMUSCULAR | Status: DC | PRN
  Administered 2023-12-07: 50 ug via INTRAVENOUS
  Administered 2023-12-07 (×2): 25 ug via INTRAVENOUS

## 2023-12-07 MED ORDER — BUPIVACAINE HCL (PF) 0.5 % IJ SOLN
INTRAMUSCULAR | Status: AC
  Filled 2023-12-07: qty 30

## 2023-12-07 MED ORDER — PROPOFOL 10 MG/ML IV BOLUS
INTRAVENOUS | Status: DC | PRN
  Administered 2023-12-07: 75 ug/kg/min via INTRAVENOUS
  Administered 2023-12-07: 30 mg via INTRAVENOUS

## 2023-12-07 MED ORDER — PROSOURCE PLUS PO LIQD
30.0000 mL | Freq: Two times a day (BID) | ORAL | Status: DC
  Filled 2023-12-07 (×5): qty 30

## 2023-12-07 MED ORDER — LACTATED RINGERS IV SOLN: INTRAVENOUS | Status: DC

## 2023-12-07 MED ORDER — BUPIVACAINE HCL (PF) 0.5 % IJ SOLN
INTRAMUSCULAR | Status: DC | PRN
  Administered 2023-12-07: 10 mL

## 2023-12-07 MED ORDER — LIDOCAINE HCL (CARDIAC) PF 50 MG/5ML IV SOSY
PREFILLED_SYRINGE | INTRAVENOUS | Status: DC | PRN
  Administered 2023-12-07: 10 mL via INTRAVENOUS

## 2023-12-07 MED ORDER — SODIUM CHLORIDE 0.9 % IR SOLN
Status: DC | PRN
  Administered 2023-12-07: 1000 mL

## 2023-12-07 MED ORDER — PROPOFOL 500 MG/50ML IV EMUL
INTRAVENOUS | Status: AC
  Filled 2023-12-07: qty 50

## 2023-12-07 MED ORDER — NITROGLYCERIN 0.4 MG SL SUBL: 0.4000 mg | SUBLINGUAL_TABLET | SUBLINGUAL | Status: DC | PRN

## 2023-12-07 SURGICAL SUPPLY — 27 items
BNDG ELASTIC 3X5.8 VLCR NS LF (GAUZE/BANDAGES/DRESSINGS) IMPLANT
BNDG GAUZE ROLL STR 2.25X3YD (GAUZE/BANDAGES/DRESSINGS) IMPLANT
CLOTH BEACON ORANGE TIMEOUT ST (SAFETY) ×1 IMPLANT
COVER LIGHT HANDLE (MISCELLANEOUS) IMPLANT
ELECT NDL BLADE 2-5/6 (NEEDLE) IMPLANT
ELECTRODE REM PT RTRN 9FT ADLT (ELECTROSURGICAL) ×1 IMPLANT
GAUZE SPONGE 2X2 STRL 8-PLY (GAUZE/BANDAGES/DRESSINGS) ×1 IMPLANT
GAUZE SPONGE 4X4 12PLY STRL (GAUZE/BANDAGES/DRESSINGS) ×1 IMPLANT
GAUZE XEROFORM 1X8 LF (GAUZE/BANDAGES/DRESSINGS) IMPLANT
GLOVE BIOGEL PI IND STRL 7.0 (GLOVE) ×2 IMPLANT
GLOVE SURG SS PI 7.5 STRL IVOR (GLOVE) ×2 IMPLANT
GOWN STRL REUS W/TWL LRG LVL3 (GOWN DISPOSABLE) ×3 IMPLANT
KIT TURNOVER KIT A (KITS) ×1 IMPLANT
MANIFOLD NEPTUNE II (INSTRUMENTS) ×1 IMPLANT
NDL HYPO 18GX1.5 BLUNT FILL (NEEDLE) ×1 IMPLANT
NDL HYPO 21X1.5 SAFETY (NEEDLE) ×1 IMPLANT
PACK BASIC LIMB (CUSTOM PROCEDURE TRAY) ×1 IMPLANT
PAD ARMBOARD POSITIONER FOAM (MISCELLANEOUS) ×1 IMPLANT
POSITIONER HEAD 8X9X4 ADT (SOFTGOODS) ×1 IMPLANT
SET BASIN LINEN APH (SET/KITS/TRAYS/PACK) ×1 IMPLANT
SOLN 0.9% NACL POUR BTL 1000ML (IV SOLUTION) ×1 IMPLANT
SOLUTION SCRB POV-IOD 4OZ 7.5% (MISCELLANEOUS) ×1 IMPLANT
SPONGE T-LAP 18X18 ~~LOC~~+RFID (SPONGE) ×1 IMPLANT
SUT PROLENE NAB BLUE 3-0 30IN (SUTURE) IMPLANT
SYR 30ML LL (SYRINGE) ×1 IMPLANT
SYR BULB IRRIG 60ML STRL (SYRINGE) ×1 IMPLANT
SYR CONTROL 10ML LL (SYRINGE) ×1 IMPLANT

## 2023-12-07 NOTE — Plan of Care (Signed)

## 2023-12-07 NOTE — Evaluation (Signed)
 Physical Therapy Evaluation Patient Details Name: Brianna [name] MRN: [id number] DOB: 12/09/1960 Today's Date: 12/07/2023  History of Present Illness  Brianna [name] is a 63 y.o. female with medical history significant of multiple comorbidities including insulin  dependent type 2 Diabetes mellitus, CKD stage IV, on renal transplant list, moderate left-sided carotid artery disease, hyperlipidemia, essential hypertension, amongst other medical problems, who presented the emergency room with chief complaint of worsening right second toe infection.    She said that she was seen by podiatrist in [place], [place], [place]  for right second toe infection and was prescribed doxycycline 1 week back.  She took 4 doses and then started having nausea and vomiting so she could not tolerate it.  She does use insulin  at home for management of diabetes mellitus along with blood pressure medications for management of essential hypertension.  She said that she is on renal transplant list at [facility] and she is supposed to follow-up with them this month.    Clinical Impression  Presented w/ general weakness due to sx of anesthesia. Pt was able to perform bed mobility Independently to EOB, during transfer from sit to stand pt was cued to use rearfoot to WB. Pt required CGA/ Min A from bed to chair w/o AD. With ambulation into hallway pt. Required CGA/ Min A w/ RW. Towards the end of ambulation pt mentioned to be lightheaded and feeling dizzy due anesthesia. Pt was placed in chair and rolled back into room. Pt was left in chair w/ family and call bell at bed side. Nursing staff was notified on pt. Status.Patient will benefit from continued skilled physical therapy in hospital and recommended venue below to increase strength, balance, endurance for safe ADLs and gait.       If plan is discharge home, recommend the following: A little help with walking and/or transfers;Assist for transportation;Assistance  with cooking/housework;Help with stairs or ramp for entrance   Can travel by private vehicle    Yes    Equipment Recommendations None recommended by PT  Recommendations for Other Services       Functional Status Assessment Patient has had a recent decline in their functional status and demonstrates the ability to make significant improvements in function in a reasonable and predictable amount of time.     Precautions / Restrictions Precautions Precautions: Fall Recall of Precautions/Restrictions: Intact Restrictions Weight Bearing Restrictions Per Provider Order: No      Mobility  Bed Mobility Overal bed mobility: Independent                  Transfers Overall transfer level: Needs assistance Equipment used: None Transfers: Sit to/from Stand, Bed to chair/wheelchair/BSC Sit to Stand: Contact guard assist, Min assist   Step pivot transfers: Contact guard assist, Min assist       General transfer comment: assisted to chair, used rearfoot for transfer from bed to chair    Ambulation/Gait Ambulation/Gait assistance: Min assist Gait Distance (Feet): 30 Feet Assistive device: Rolling walker (2 wheels) Gait Pattern/deviations: Decreased step length - right, Decreased step length - left, Decreased stance time - right, Decreased stance time - left, Step-to pattern, Narrow base of support       General Gait Details: pt was able to ambulation into hallway, w/ RW using R rearfoot to ambulate, towards the end of ambulation pt felt lightheaded and mildly dizzy due to post-op sx, pt was put in chair rolled back to room  Stairs  Wheelchair Mobility     Tilt Bed    Modified Rankin (Stroke Patients Only)       Balance Overall balance assessment: Needs assistance Sitting-balance support: Bilateral upper extremity supported, Feet supported Sitting balance-Leahy Scale: Good Sitting balance - Comments: good to EOB   Standing balance support:  Bilateral upper extremity supported, During functional activity, Reliant on assistive device for balance Standing balance-Leahy Scale: Fair Standing balance comment: fair using RW                             Pertinent Vitals/Pain Pain Assessment Pain Assessment: No/denies pain    Home Living Family/patient expects to be discharged to:: Private residence Living Arrangements: Alone Available Help at Discharge: Family;Friend(s) Type of Home: Apartment Home Access: Stairs to enter   Entrance Stairs-Number of Steps: 20   Home Layout: One level Home Equipment: Agricultural Consultant (2 wheels)      Prior Function Prior Level of Function : Independent/Modified Independent             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, working, driving, uses RW for household ambulation when needed ADLs Comments: independent     Extremity/Trunk Assessment        Lower Extremity Assessment Lower Extremity Assessment: Generalized weakness    Cervical / Trunk Assessment Cervical / Trunk Assessment: Normal  Communication   Communication Communication: No apparent difficulties    Cognition Arousal: Alert Behavior During Therapy: WFL for tasks assessed/performed   PT - Cognitive impairments: No apparent impairments                         Following commands: Intact       Cueing Cueing Techniques: Verbal cues     General Comments      Exercises     Assessment/Plan    PT Assessment Patient needs continued PT services  PT Problem List Decreased strength;Decreased activity tolerance;Decreased balance;Decreased mobility;Decreased coordination;Decreased safety awareness       PT Treatment Interventions DME instruction;Gait training;Stair training;Patient/family education;Functional mobility training;Therapeutic activities;Therapeutic exercise;Balance training    PT Goals (Current goals can be found in the Care Plan section)  Acute Rehab PT  Goals Patient Stated Goal: return home PT Goal Formulation: With patient Time For Goal Achievement: 12/14/23 Potential to Achieve Goals: Good    Frequency Min 3X/week     Co-evaluation               AM-PAC PT 6 Clicks Mobility  Outcome Measure Help needed turning from your back to your side while in a flat bed without using bedrails?: None Help needed moving from lying on your back to sitting on the side of a flat bed without using bedrails?: None Help needed moving to and from a bed to a chair (including a wheelchair)?: A Little Help needed standing up from a chair using your arms (e.g., wheelchair or bedside chair)?: A Little Help needed to walk in hospital room?: A Little Help needed climbing 3-5 steps with a railing? : A Lot 6 Click Score: 19    End of Session Equipment Utilized During Treatment: Gait belt Activity Tolerance: Patient tolerated treatment well;Patient limited by fatigue Patient left: with family/visitor present;in chair;with call bell/phone within reach Nurse Communication: Mobility status PT Visit Diagnosis: Unsteadiness on feet (R26.81);Muscle weakness (generalized) (M62.81);Other (comment);Dizziness and giddiness (R42) (post-op sx)    Time: 1501-1530 PT Time Calculation (min) (ACUTE ONLY): 29 min  Charges:   PT Evaluation $PT Eval Moderate Complexity: 1 Mod PT Treatments $Therapeutic Activity: 23-37 mins PT General Charges $$ ACUTE PT VISIT: 1 Visit        Lorenda Grecco, SPT

## 2023-12-07 NOTE — Therapy (Signed)
 Patient out of room. Will attempt eval at later time when appropriate.  9:53 AM, 12/07/23 Brianna [name] Brianna [name] MPT [facility]  [phone]

## 2023-12-07 NOTE — Progress Notes (Signed)
 RN escorted patient to the floor.

## 2023-12-07 NOTE — Anesthesia Procedure Notes (Signed)
 Anesthesia Regional Block: Ankle block   Pre-Anesthetic Checklist: , timeout performed,  Correct Patient, Correct Site, Correct Laterality,  Correct Procedure, Correct Position, site marked,  Risks and benefits discussed,  Surgical consent,  Pre-op evaluation,  At surgeon's request and post-op pain management  Laterality: Right  Prep: chloraprep       Needles:  Injection technique: Single-shot      Needle Length: 10cm  Needle Gauge: 25     Additional Needles:   Narrative:  Injection made incrementally with aspirations every 3 mL. CRNA: Pheobe Adine CROME, CRNA

## 2023-12-07 NOTE — Progress Notes (Addendum)
 PROGRESS NOTE    Brianna [name]  FMW:[id number] DOB: February 10, 1960 DOA: 12/06/2023 PCP: Kristine Corean Deed, NP   Brief Narrative:    63 y.o. female with medical history significant of multiple comorbidities including insulin  dependent type 2 Diabetes mellitus, CKD stage IV, on renal transplant list, moderate left-sided carotid artery disease, hyperlipidemia, essential hypertension, amongst other medical problems, who presented the emergency room with chief complaint of worsening right second toe infection.  Xray Of the right foot was done which showed evidence of early osteomyelitis of the right second toe distal phalanx.   General surgery consulted Plan for right 2nd toe amputation to be done on 12/4.  Assessment & Plan:  Principal Problem:   Osteomyelitis of toe of right foot (HCC) Active Problems:   Dry gangrene (HCC)   Dry gangrene of right second toe,foul smelling,POA: Osteomyelitis of second right toe,POA: Mild b/l LE peripheral arterial disease,POA: -I have ordered IV Linezolid, Flagyl and Cefepime -ESR and CRP highly elevated US  arterial duplex showed Left ABI 0.89 and right ABI 0.92, consistent with mild peripheral arterial disease in both lower extremities, Monophasic distal arterial waveforms in both lower extremities, suggesting distal arterial insufficiency. Plan for right second toe amputation to be done today.   Hypertensive urgency,POA: resolved -SBPs in early 200s in ED -Resumed coreg  3.125 po BID  -Dced oral hydralazine as bp is on the softer side now -Ordered IV hydralazine 10 mg prn for SBP > 160 mmHg.   Insulin  dependent type 2 diabetes mellitus complicated by lower extremity peripheral neuropathy, diabetic nephropathy as well as diabetic foot infection,POA: -Ordered SSI, long acting as well as short acting scheduled pre meal insulin  F/u HbA1c -Resumed gabapentin  300 mg PO TID for neuropathy symptoms.   Left-sided moderate carotid artery  stenosis, POA: Continue with aspirin  and statin.  Outpatient follow-up with vascular surgeon.   CKD IV,POA: -On renal transplant list at [facility]    Disposition: Home     DVT prophylaxis: SCD's Start: 12/06/23 2236 heparin injection 5,000 Units Start: 12/06/23 1400     Code Status: Full Code Family Communication:  None at the bedside Status is: Inpatient Remains inpatient appropriate because: toe gangrene, osteomyelitis    Subjective:  No acute events overnight. She is going for right second toe amputation today.  Examination:  Constitutional: NAD, calm, comfortable Eyes: PERRL, lids and conjunctivae normal ENMT: Mucous membranes are moist. Posterior pharynx clear of any exudate or lesions.Normal dentition.  Neck: normal, supple, no masses, no thyromegaly Respiratory: clear to auscultation bilaterally, no wheezing, no crackles. Normal respiratory effort. No accessory muscle use.  Cardiovascular: Regular rate and rhythm, no murmurs / rubs / gallops. No extremity edema. 2+ pedal pulses. No carotid bruits.  Abdomen: no tenderness, no masses palpated. No hepatosplenomegaly. Bowel sounds positive.  Musculoskeletal: Evidence of right second toe dry gangrene, there is evidence of blackish discoloration of the left great toe as well as ulceration present on the plantar aspect of left great toe Neurologic: CN 2-12 grossly intact. Sensation intact, DTR normal. Strength 5/5 x all 4 extremities.  Psychiatric: Normal judgment and insight. Alert and oriented x 3. Normal mood.        Diet Orders (From admission, onward)     Start     Ordered   12/07/23 0001  Diet NPO time specified  Diet effective midnight        12/06/23 2235            Objective: Vitals:   12/06/23 2334 12/07/23 0254 12/07/23  0845 12/07/23 0848  BP: (!) 145/71 (!) 140/71 128/73   Pulse: 90 88    Resp: 18 18 12    Temp: 98.1 F (36.7 C) 98 F (36.7 C) 98.6 F (37 C)   TempSrc: Oral Oral    SpO2: 94% 97%     Weight:    81.2 kg  Height:    5' 7 (1.702 m)    Intake/Output Summary (Last 24 hours) at 12/07/2023 0925 Last data filed at 12/07/2023 0345 Gross per 24 hour  Intake 1012.46 ml  Output --  Net 1012.46 ml   Filed Weights   12/06/23 0800 12/07/23 0848  Weight: 81.2 kg 81.2 kg    Scheduled Meds:  [MAR Hold] aspirin   81 mg Oral Daily   [MAR Hold] atorvastatin   80 mg Oral Daily   [MAR Hold] carvedilol   3.125 mg Oral BID WC   [MAR Hold] cholecalciferol  2,000 Units Oral Daily   [MAR Hold] gabapentin   300 mg Oral TID   [MAR Hold] heparin  5,000 Units Subcutaneous Q8H   [MAR Hold] hydrALAZINE  25 mg Oral Q8H   [MAR Hold] insulin  aspart  0-5 Units Subcutaneous QHS   [MAR Hold] insulin  aspart  0-9 Units Subcutaneous TID WC   [MAR Hold] insulin  aspart  2 Units Subcutaneous TID WC   [MAR Hold] insulin  glargine-yfgn  5 Units Subcutaneous QHS   [MAR Hold] levothyroxine  75 mcg Oral Daily   Continuous Infusions:  sodium chloride  40 mL/hr at 12/06/23 1536   [MAR Hold] ceFEPime (MAXIPIME) IV 2 g (12/06/23 1407)   [MAR Hold] linezolid (ZYVOX) IV 600 mg (12/06/23 2347)   [MAR Hold] metronidazole 500 mg (12/07/23 0106)    Nutritional status     Body mass index is 28.04 kg/m.  Data Reviewed:   CBC: Recent Labs  Lab 12/06/23 0843 12/06/23 0844 12/07/23 0414  WBC  --  9.3 8.0  NEUTROABS  --  6.6  --   HGB 8.5* 8.3* 7.4*  HCT 25.0* 26.3* 23.5*  MCV  --  91.3 91.8  PLT  --  431* 365   Basic Metabolic Panel: Recent Labs  Lab 12/06/23 0843 12/06/23 0844 12/07/23 0414  NA 144 141 141  K 4.1 4.0 4.2  CL 112* 109 110  CO2  --  18* 21*  GLUCOSE 275* 273* 204*  BUN 42* 43* 43*  CREATININE 3.80* 3.38* 3.83*  CALCIUM   --  9.4 8.7*   GFR: Estimated Creatinine Clearance: 16.9 mL/min (A) (by C-G formula based on SCr of 3.83 mg/dL (H)). Liver Function Tests: Recent Labs  Lab 12/06/23 0844  AST 19  ALT 12  ALKPHOS 148*  BILITOT 0.3  PROT 8.1  ALBUMIN 3.5   No results  for input(s): LIPASE, AMYLASE in the last 168 hours. No results for input(s): AMMONIA in the last 168 hours. Coagulation Profile: No results for input(s): INR, PROTIME in the last 168 hours. Cardiac Enzymes: No results for input(s): CKTOTAL, CKMB, CKMBINDEX, TROPONINI in the last 168 hours. BNP (last 3 results) No results for input(s): PROBNP in the last 8760 hours. HbA1C: No results for input(s): HGBA1C in the last 72 hours. CBG: Recent Labs  Lab 12/06/23 2116 12/07/23 0746  GLUCAP 190* 168*   Lipid Profile: Recent Labs    12/06/23 0844  CHOL 210*  HDL 40*  LDLCALC 137*  TRIG 167*  CHOLHDL 5.3   Thyroid Function Tests: Recent Labs    12/06/23 0844  TSH 1.700   Anemia Panel:  No results for input(s): VITAMINB12, FOLATE, FERRITIN, TIBC, IRON, RETICCTPCT in the last 72 hours. Sepsis Labs: No results for input(s): PROCALCITON, LATICACIDVEN in the last 168 hours.  Recent Results (from the past 240 hours)  Surgical pcr screen     Status: None   Collection Time: 12/07/23  6:40 AM   Specimen: Nasal Mucosa; Nasal Swab  Result Value Ref Range Status   MRSA, PCR NEGATIVE NEGATIVE Final   Staphylococcus aureus NEGATIVE NEGATIVE Final    Comment: (NOTE) The Xpert SA Assay (FDA approved for NASAL specimens in patients 12 years of age and older), is one component of a comprehensive surveillance program. It is not intended to diagnose infection nor to guide or monitor treatment. Performed at [facility], [address], [place], [place] [address]          Radiology Studies: US  ARTERIAL ABI (SCREENING LOWER EXTREMITY) Result Date: 12/06/2023 EXAM: VASCULAR SCREENING 12/06/2023 12:53:29 PM CLINICAL HISTORY: [id number] Gangrene of toe of right foot (HCC) [id number] Gangrene of toe of right foot (HCC) [id number] TECHNIQUE: ultrasound   ABIs were  acquired. COMPARISON: None available. FINDINGS: ANKLE BRACHIAL INDEX: Right: 0.92 Left: 0.89 Monophasic  arterial waveforms are recorded distally in both lower extremities. IMPRESSION: 1. Left ABI 0.89 and right ABI 0.92, consistent with mild peripheral arterial disease in both lower extremities. 2. Monophasic distal arterial waveforms in both lower extremities, suggesting distal arterial insufficiency. Electronically signed by: Katheleen Faes MD 12/06/2023 01:58 PM EST RP Workstation: [id number]   DG Foot Complete Right Result Date: 12/06/2023 CLINICAL DATA:  Right second toe infection. EXAM: RIGHT FOOT COMPLETE - 3+ VIEW COMPARISON:  None Available. FINDINGS: There is no evidence of fracture or dislocation. Subtle loss of cortical integrity along the distal aspect of the second distal phalanx is suspicious for early osteomyelitis. No soft tissue gas or foreign body. IMPRESSION: Subtle loss of cortical integrity along the distal aspect of the second distal phalanx is suspicious for early osteomyelitis. Electronically Signed   By: Marcey Moan M.D.   On: 12/06/2023 08:45           LOS: 1 day   Time spent= 35 mins    Deliliah Room, MD [facility]  If 7PM-7AM, please contact night-coverage  12/07/2023, 9:25 AM

## 2023-12-07 NOTE — Anesthesia Preprocedure Evaluation (Addendum)
 Anesthesia Evaluation  Patient identified by MRN, date of birth, ID band Patient awake    Airway Mallampati: II  TM Distance: >3 FB Neck ROM: Full    Dental no notable dental hx.    Pulmonary    Pulmonary exam normal        Cardiovascular hypertension,  Rhythm:Regular Rate:Normal     Neuro/Psych    GI/Hepatic ,GERD  ,,  Endo/Other  diabetes    Renal/GU ESRFRenal disease     Musculoskeletal   Abdominal Normal abdominal exam  (+)   Peds  Hematology  (+) Blood dyscrasia, anemia   Anesthesia Other Findings   Reproductive/Obstetrics                              Anesthesia Physical Anesthesia Plan  ASA: 3  Anesthesia Plan: MAC and Regional   Post-op Pain Management: Regional block*   Induction: Intravenous  PONV Risk Score and Plan: 2  Airway Management Planned: Natural Airway and Nasal Cannula  Additional Equipment:   Intra-op Plan:   Post-operative Plan:   Informed Consent: I have reviewed the patients History and Physical, chart, labs and discussed the procedure including the risks, benefits and alternatives for the proposed anesthesia with the patient or authorized representative who has indicated his/her understanding and acceptance.     Dental advisory given  Plan Discussed with: Anesthesiologist and Surgeon  Anesthesia Plan Comments:          Anesthesia Quick Evaluation

## 2023-12-07 NOTE — Op Note (Signed)
 Patient:  Brianna [name]  DOB:  04-12-1960  MRN:  [id number]   Preop Diagnosis: Dry gangrene, right second toe  Postop Diagnosis: Same  Procedure: Right second toe amputation  Surgeon: Oneil Budge, MD  Anes: Regional  Indications: Patient is a 63 year old black female with multiple medical problems including diabetes mellitus who presents with dry gangrene of the right second toe.  The risks and benefits of the procedure including bleeding, infection, and the possibility of wound breakdown were fully explained to the patient, who gave informed consent.  Procedure note: The patient was placed in the supine position.  After regional anesthesia was performed on the right foot, the right foot was prepped and draped using the usual sterile technique with Hibiclens.  Surgical site confirmation was performed.  An elliptical incision was made around the base of the right second toe.  This was taken down to the metatarsal head.  The toe was then amputated at the second metatarsal head without difficulty.  A rasp was used to clean up the distal metatarsal head.  The wound was irrigated with normal saline.  The incision was closed using both 2-0 and 3-0 Prolene interrupted sutures.  Xeroform and a dry sterile dressing were applied.  All tape and needle counts were correct at the end of the procedure.  The patient was transferred to PACU in stable condition.  Complications: None  EBL: Minimal  Specimen: Right second toe

## 2023-12-07 NOTE — Transfer of Care (Signed)
 Immediate Anesthesia Transfer of Care Note  Patient: Brianna [name]  Procedure(s) Performed: AMPUTATION, TOE (Right: Toe)  Patient Location: PACU  Anesthesia Type:MAC and Regional  Level of Consciousness: awake, alert , oriented, and patient cooperative  Airway & Oxygen Therapy: Patient Spontanous Breathing and Patient connected to nasal cannula oxygen  Post-op Assessment: Report given to RN and Post -op Vital signs reviewed and stable  Post vital signs: Reviewed and stable  Last Vitals:  Vitals Value Taken Time  BP 128/76 12/07/23 10:19  Temp 36.6 C 12/07/23 10:19  Pulse 87 12/07/23 10:19  Resp 12 12/07/23 10:20  SpO2 98   Vitals shown include unfiled device data.  Last Pain:  Vitals:   12/07/23 0848  TempSrc:   PainSc: 0-No pain         Complications: No notable events documented.

## 2023-12-07 NOTE — Plan of Care (Signed)
  Problem: Education: Goal: Knowledge of General Education information will improve Description: Including pain rating scale, medication(s)/side effects and non-pharmacologic comfort measures Outcome: Progressing   Problem: Clinical Measurements: Goal: Ability to maintain clinical measurements within normal limits will improve Outcome: Progressing Goal: Will remain free from infection Outcome: Progressing Goal: Diagnostic test results will improve Outcome: Progressing Goal: Respiratory complications will improve Outcome: Progressing Goal: Cardiovascular complication will be avoided Outcome: Progressing   Problem: Activity: Goal: Risk for activity intolerance will decrease Outcome: Progressing   Problem: Nutrition: Goal: Adequate nutrition will be maintained Outcome: Progressing   Problem: Coping: Goal: Level of anxiety will decrease Outcome: Progressing   Problem: Elimination: Goal: Will not experience complications related to bowel motility Outcome: Progressing Goal: Will not experience complications related to urinary retention Outcome: Progressing   Problem: Pain Managment: Goal: General experience of comfort will improve and/or be controlled Outcome: Progressing   Problem: Safety: Goal: Ability to remain free from injury will improve Outcome: Progressing   Problem: Skin Integrity: Goal: Risk for impaired skin integrity will decrease Outcome: Progressing   Problem: Education: Goal: Ability to describe self-care measures that may prevent or decrease complications (Diabetes Survival Skills Education) will improve Outcome: Progressing Goal: Individualized Educational Video(s) Outcome: Progressing   Problem: Coping: Goal: Ability to adjust to condition or change in health will improve Outcome: Progressing   Problem: Fluid Volume: Goal: Ability to maintain a balanced intake and output will improve Outcome: Progressing   Problem: Health Behavior/Discharge  Planning: Goal: Ability to identify and utilize available resources and services will improve Outcome: Progressing Goal: Ability to manage health-related needs will improve Outcome: Progressing

## 2023-12-07 NOTE — Progress Notes (Signed)
   12/07/23 1325  TOC Brief Assessment  Insurance and Status Reviewed  Patient has primary care physician Yes  Home environment has been reviewed Apartment  Prior level of function: Independent  Prior/Current Home Services No current home services  Social Drivers of Health Review SDOH reviewed no interventions necessary  Readmission risk has been reviewed Yes  Transition of care needs transition of care needs identified, TOC will continue to follow   ICM to follow for [facility] needs.

## 2023-12-07 NOTE — Plan of Care (Signed)
  Problem: Acute Rehab PT Goals(only PT should resolve) Goal: Patient Will Perform Sitting Balance Outcome: Progressing Flowsheets (Taken 12/07/2023 1550) Patient will perform sitting balance:  with modified independence  Independently Goal: Patient Will Transfer Sit To/From Stand Outcome: Progressing Flowsheets (Taken 12/07/2023 1550) Patient will transfer sit to/from stand:  with modified independence  Independently Goal: Pt Will Transfer Bed To Chair/Chair To Bed Outcome: Progressing Flowsheets (Taken 12/07/2023 1550) Pt will Transfer Bed to Chair/Chair to Bed:  with modified independence  with supervision Goal: Pt Will Perform Standing Balance Or Pre-Gait Outcome: Progressing Flowsheets (Taken 12/07/2023 1550) Pt will perform standing balance or pre-gait:  with Supervision  with contact guard assist  3- 5 min  with no UE support Goal: Pt Will Ambulate Outcome: Progressing Flowsheets (Taken 12/07/2023 1550) Pt will Ambulate:  75 feet  50 feet  with modified independence  with supervision  with rolling walker Goal: Pt Will Go Up/Down Stairs Outcome: Progressing Flowsheets (Taken 12/07/2023 1550) Pt will Go Up / Down Stairs:  1-2 stairs  with supervision  with contact guard assist  with rolling walker  with rail(s)  Brianna [name], SPT

## 2023-12-07 NOTE — Anesthesia Preprocedure Evaluation (Deleted)
 Anesthesia Evaluation  Patient identified by MRN, date of birth, ID band Patient awake    Reviewed: Allergy & Precautions, H&P , NPO status , Patient's Chart, lab work & pertinent test results  Airway Mallampati: III  TM Distance: >3 FB Neck ROM: Full    Dental no notable dental hx.    Pulmonary neg pulmonary ROS   Pulmonary exam normal breath sounds clear to auscultation       Cardiovascular hypertension, Normal cardiovascular exam Rhythm:Regular Rate:Normal     Neuro/Psych negative neurological ROS  negative psych ROS   GI/Hepatic Neg liver ROS,GERD  ,,  Endo/Other  diabetes    Renal/GU negative Renal ROS  negative genitourinary   Musculoskeletal negative musculoskeletal ROS (+)    Abdominal   Peds negative pediatric ROS (+)  Hematology  (+) Blood dyscrasia, anemia Severe anemia at 7.4   Anesthesia Other Findings   Reproductive/Obstetrics negative OB ROS                              Anesthesia Physical Anesthesia Plan  ASA: 4  Anesthesia Plan: General   Post-op Pain Management:    Induction: Intravenous  PONV Risk Score and Plan:   Airway Management Planned: LMA  Additional Equipment:   Intra-op Plan:   Post-operative Plan: Extubation in OR  Informed Consent: I have reviewed the patients History and Physical, chart, labs and discussed the procedure including the risks, benefits and alternatives for the proposed anesthesia with the patient or authorized representative who has indicated his/her understanding and acceptance.     Dental advisory given  Plan Discussed with: CRNA  Anesthesia Plan Comments:          Anesthesia Quick Evaluation

## 2023-12-07 NOTE — Progress Notes (Signed)
 Assisted Brad Strothers,CRNA with right, ankle block. Side rails up, monitors on throughout procedure. See vital signs in flow sheet. Tolerated Procedure well.

## 2023-12-07 NOTE — Discharge Instructions (Addendum)
 Carbohydrate Counting For People With Diabetes  Foods with carbohydrates make your blood glucose level go up. Learning how to count carbohydrates can help you control your blood glucose levels. First, identify the foods you eat that contain carbohydrates. Then, using the Foods with Carbohydrates chart, determine about how much carbohydrates are in your meals and snacks. Make sure you are eating foods with fiber, protein, and healthy fat along with your carbohydrate foods. Foods with Carbohydrates The following table shows carbohydrate foods that have about 15 grams of carbohydrate each. Using measuring cups, spoons, or a food scale when you first begin learning about carbohydrate counting can help you learn about the portion sizes you typically eat. The following foods have 15 grams carbohydrate each:  Grains 1 slice bread (1 ounce)  1 small tortilla (6-inch size)   large bagel (1 ounce)  1/3 cup pasta or rice (cooked)   hamburger or hot dog bun ( ounce)   cup cooked cereal   to  cup ready-to-eat cereal  2 taco shells (5-inch size) Fruit 1 small fresh fruit ( to 1 cup)   medium banana  17 small grapes (3 ounces)  1 cup melon or berries   cup canned or frozen fruit  2 tablespoons dried fruit (blueberries, cherries, cranberries, raisins)   cup unsweetened fruit juice  Starchy Vegetables  cup cooked beans, peas, corn, potatoes/sweet potatoes   large baked potato (3 ounces)  1 cup acorn or butternut squash  Snack Foods 3 to 6 crackers  8 potato chips or 13 tortilla chips ( ounce to 1 ounce)  3 cups popped popcorn  Dairy 3/4 cup (6 ounces) nonfat plain yogurt, or yogurt with sugar-free sweetener  1 cup milk  1 cup plain rice, soy, coconut or flavored almond milk Sweets and Desserts  cup ice cream or frozen yogurt  1 tablespoon jam, jelly, pancake syrup, table sugar, or honey  2 tablespoons light pancake syrup  1 inch square of frosted cake or 2 inch square of unfrosted  cake  2 small cookies (2/3 ounce each) or  large cookie  Sometimes you'll have to estimate carbohydrate amounts if you don't know the exact recipe. One cup of mixed foods like soups can have 1 to 2 carbohydrate servings, while some casseroles might have 2 or more servings of carbohydrate. Foods that have less than 20 calories in each serving can be counted as "free" foods. Count 1 cup raw vegetables, or  cup cooked non-starchy vegetables as "free" foods. If you eat 3 or more servings at one meal, then count them as 1 carbohydrate serving.  Foods without Carbohydrates  Not all foods contain carbohydrates. Meat, some dairy, fats, non-starchy vegetables, and many beverages don't contain carbohydrate. So when you count carbohydrates, you can generally exclude chicken, pork, beef, fish, seafood, eggs, tofu, cheese, butter, sour cream, avocado, nuts, seeds, olives, mayonnaise, water , black coffee, unsweetened tea, and zero-calorie drinks. Vegetables with no or low carbohydrate include green beans, cauliflower, tomatoes, and onions. How much carbohydrate should I eat at each meal?  Carbohydrate counting can help you plan your meals and manage your weight. Following are some starting points for carbohydrate intake at each meal. Work with your registered dietitian nutritionist to find the best range that works for your blood glucose and weight.   To Lose Weight To Maintain Weight  Women 2 - 3 carb servings 3 - 4 carb servings  Men 3 - 4 carb servings 4 - 5 carb servings  Checking your  blood glucose after meals will help you know if you need to adjust the timing, type, or number of carbohydrate servings in your meal plan. Achieve and keep a healthy body weight by balancing your food intake and physical activity.  Tips How should I plan my meals?  Plan for half the food on your plate to include non-starchy vegetables, like salad greens, broccoli, or carrots. Try to eat 3 to 5 servings of non-starchy vegetables  every day. Have a protein food at each meal. Protein foods include chicken, fish, meat, eggs, or beans (note that beans contain carbohydrate). These two food groups (non-starchy vegetables and proteins) are low in carbohydrate. If you fill up your plate with these foods, you will eat less carbohydrate but still fill up your stomach. Try to limit your carbohydrate portion to  of the plate.  What fats are healthiest to eat?  Diabetes increases risk for heart disease. To help protect your heart, eat more healthy fats, such as olive oil, nuts, and avocado. Eat less saturated fats like butter, cream, and high-fat meats, like bacon and sausage. Avoid trans fats, which are in all foods that list "partially hydrogenated oil" as an ingredient. What should I drink?  Choose drinks that are not sweetened with sugar. The healthiest choices are water , carbonated or seltzer waters, and tea and coffee without added sugars.  Sweet drinks will make your blood glucose go up very quickly. One serving of soda or energy drink is  cup. It is best to drink these beverages only if your blood glucose is low.  Artificially sweetened, or diet drinks, typically do not increase your blood glucose if they have zero calories in them. Read labels of beverages, as some diet drinks do have carbohydrate and will raise your blood glucose. Label Reading Tips Read Nutrition Facts labels to find out how many grams of carbohydrate are in a food you want to eat. Don't forget: sometimes serving sizes on the label aren't the same as how much food you are going to eat, so you may need to calculate how much carbohydrate is in the food you are serving yourself.   Carbohydrate Counting for People with Diabetes Sample 1-Day Menu  Breakfast  cup yogurt, low fat, low sugar (1 carbohydrate serving)   cup cereal, ready-to-eat, unsweetened (1 carbohydrate serving)  1 cup strawberries (1 carbohydrate serving)   cup almonds ( carbohydrate serving)   Lunch 1, 5 ounce can chunk light tuna  2 ounces cheese, low fat cheddar  6 whole wheat crackers (1 carbohydrate serving)  1 small apple (1 carbohydrate servings)   cup carrots ( carbohydrate serving)   cup snap peas  1 cup 1% milk (1 carbohydrate serving)   Evening Meal Stir fry made with: 3 ounces chicken  1 cup brown rice (3 carbohydrate servings)   cup broccoli ( carbohydrate serving)   cup green beans   cup onions  1 tablespoon olive oil  2 tablespoons teriyaki sauce ( carbohydrate serving)  Evening Snack 1 extra small banana (1 carbohydrate serving)  1 tablespoon peanut butter   Carbohydrate Counting for People with Diabetes Vegan Sample 1-Day Menu  Breakfast 1 cup cooked oatmeal (2 carbohydrate servings)   cup blueberries (1 carbohydrate serving)  2 tablespoons flaxseeds  1 cup soymilk fortified with calcium  and vitamin D   1 cup coffee  Lunch 2 slices whole wheat bread (2 carbohydrate servings)   cup baked tofu   cup lettuce  2 slices tomato  2 slices avocado  cup baby carrots ( carbohydrate serving)  1 orange (1 carbohydrate serving)  1 cup soymilk fortified with calcium  and vitamin D    Evening Meal Burrito made with: 1 6-inch corn tortilla (1 carbohydrate serving)  1 cup refried vegetarian beans (2 carbohydrate servings)   cup chopped tomatoes   cup lettuce   cup salsa  1/3 cup brown rice (1 carbohydrate serving)  1 tablespoon olive oil for rice   cup zucchini   Evening Snack 6 small whole grain crackers (1 carbohydrate serving)  2 apricots ( carbohydrate serving)   cup unsalted peanuts ( carbohydrate serving)    Carbohydrate Counting for People with Diabetes Vegetarian (Lacto-Ovo) Sample 1-Day Menu  Breakfast 1 cup cooked oatmeal (2 carbohydrate servings)   cup blueberries (1 carbohydrate serving)  2 tablespoons flaxseeds  1 egg  1 cup 1% milk (1 carbohydrate serving)  1 cup coffee  Lunch 2 slices whole wheat bread (2 carbohydrate  servings)  2 ounces low-fat cheese   cup lettuce  2 slices tomato  2 slices avocado   cup baby carrots ( carbohydrate serving)  1 orange (1 carbohydrate serving)  1 cup unsweetened tea  Evening Meal Burrito made with: 1 6-inch corn tortilla (1 carbohydrate serving)   cup refried vegetarian beans (1 carbohydrate serving)   cup tomatoes   cup lettuce   cup salsa  1/3 cup brown rice (1 carbohydrate serving)  1 tablespoon olive oil for rice   cup zucchini  1 cup 1% milk (1 carbohydrate serving)  Evening Snack 6 small whole grain crackers (1 carbohydrate serving)  2 apricots ( carbohydrate serving)   cup unsalted peanuts ( carbohydrate serving)    Copyright 2020  Academy of [employer]etics. All rights reserved.  Using Nutrition Labels: Carbohydrate  Serving Size  Look at the serving size. All the information on the label is based on this portion. Servings Per Container  The number of servings contained in the package. Guidelines for Carbohydrate  Look at the total grams of carbohydrate in the serving size.  1 carbohydrate choice = 15 grams of carbohydrate. Range of Carbohydrate Grams Per Choice  Carbohydrate Grams/Choice Carbohydrate Choices  6-10   11-20 1  21-25 1  26-35 2  36-40 2  41-50 3  51-55 3  56-65 4  66-70 4  71-80 5    Copyright 2020  Academy of [employer] Dietetics. All rights reserved.  Tips For Adding Protein  Patients may be advised to increase the protein in their diet but not necessarily the calories as well. However, note that when adding protein to your diet, you will also be adding extra calories. The following suggestions may help add the extra protein while keeping the calories as low as possible.  Tips Add extra egg to one or more meals  Increase the portion of milk to drink and change to skim milk if able  Include Greek yogurt or cottage cheese for snack or part of a meal  Increase portion size of protein entre  and decrease portion of starch/bread  Mix protein powder, nut butter, almond/nut milk, non-fat dry milk, or Greek yogurt to shakes and smoothies  Use these ingredients also in baked goods or other recipes Use double the amount of sandwich filling  Add protein foods to all snacks including cheese, nut butters, milk and yogurt   Food Tips for Including Protein  Beans Cook and use dried peas, beans, and tofu in soups or add to casseroles, pastas, and grain  dishes that also contain cheese or meat  Mash with cheese and milk  Use tofu to make smoothies  Commercial Protein Supplements Use nutritional supplements or protein powder sold at pharmacies and grocery stores  Use protein powder in milk drinks and desserts, such as pudding  Mix with ice cream, milk, and fruit or other flavorings for a high-protein milkshake  Cottage Cheese or [employer] Mix with or use to stuff fruits and vegetables  Add to casseroles, spaghetti, noodles, or egg dishes such as omelets, scrambled eggs, and souffls  Use gelatin, pudding-type desserts, cheesecake, and pancake or waffle batter  Use to stuff crepes, pasta shells, or manicotti  Puree and use as a substitute for sour cream  Eggs, Egg whites, and Egg Yolks Add chopped, hard-cooked eggs to salads and dressings, vegetables, casseroles, and creamed meats  Beat eggs into mashed potatoes, vegetable purees, and sauces  Add extra egg whites to quiches, scrambled eggs, custards, puddings, pancake batter, or French toast wash/batter  Make a rich custard with egg yolks, double strength milk, and sugar  Add extra hard-cooked yolks to deviled egg filling and sandwich spreads  Hard or Semi-Soft Cheese (Cheddar, [place], [place]) Melt on sandwiches, bread, muffins, tortillas, hamburgers, hot dogs, other meats or fish, vegetables, eggs, or desserts such as stewed figs or pies  Grate and add to soups, sauces, casseroles, vegetable dishes, potatoes, rice noodles, or meatloaf   Serve as a snack with crackers or bagels  Ice cream, Yogurt, and Frozen Yogurt Add to milk drinks such as milkshakes  Add to cereals, fruits, gelatin desserts, and pies  Blend or whip with soft or cooked fruits  Sandwich ice cream or frozen yogurt between enriched cake slices, cookies, or graham crackers  Use seasoned yogurt as a dip for fruits, vegetables, or chips  Use yogurt in place of sour cream in casseroles  Meat and Fish Add chopped, cooked meat or fish to vegetables, salads, casseroles, soups, sauces, and biscuit dough  Use in omelets, souffls, quiches, and sandwich fillings  Add chicken and turkey to stuffing  Wrap in pie crust or biscuit dough as turnovers  Add to stuffed baked potatoes  Add pureed meat to soups  Milk Use in beverages and in cooking  Use in preparing foods, such as hot cereal, soups, cocoa, or pudding  Add cream sauces to vegetable and other dishes  Use evaporated milk, evaporated skim milk, or sweetened condensed milk instead of milk or water  in recipes.  Nonfat Dry Milk Add 1/3 cup of nonfat dry milk powdered milk to each cup of regular milk for "double strength" milk  Add to yogurt and milk drinks, such as pasteurized eggnog and milkshakes  Add to scrambled eggs and mashed potatoes  Use in casseroles, meatloaf, hot cereal, breads, muffins, sauces, cream soups, puddings and custards, and other milk-based desserts  Nuts, Seeds, and Wheat Germ Add to casseroles, breads, muffins, pancakes, cookies, and waffles  Sprinkle on fruit, cereal, ice cream, yogurt, vegetables, salads, and toast as a crunchy topping  Use in place of breadcrumbs  Blend with parsley or spinach, herbs, and cream for a noodle, pasta, or vegetable sauce.  Roll banana in chopped nuts  Peanut Butter Spread on sandwiches, toast, muffins, crackers, waffles, pancakes, and fruit slices  Use as a dip for raw vegetables, such as carrots, cauliflower, and celery  Blend with milk drinks, smoothies,  and other beverages  Swirl through soft ice cream or yogurt  Spread on a banana then roll  in crushed, dry cereal or chopped nuts   Copyright 2020  Academy of [employer]etics. All rights reserved.

## 2023-12-07 NOTE — Interval H&P Note (Signed)
 History and Physical Interval Note:  12/07/2023 9:04 AM  Brianna [name]  has presented today for surgery, with the diagnosis of left second toe gangrene.  The various methods of treatment have been discussed with the patient and family. After consideration of risks, benefits and other options for treatment, the patient has consented to  Procedure(s): AMPUTATION, TOE (Right) as a surgical intervention.  The patient's history has been reviewed, patient examined, no change in status, stable for surgery.  I have reviewed the patient's chart and labs.  Questions were answered to the patient's satisfaction.     Oneil Budge

## 2023-12-07 NOTE — Progress Notes (Signed)
 Initial Nutrition Assessment  DOCUMENTATION CODES:   Not applicable  INTERVENTION:   Prosource Plus 30 ml PO BID, each packet provides 100 kcal and 15 gm protein. MVI with minerals daily. Education provided on increasing protein intake and carbohydrate modified diet. Carbohydrate Counting for People with Diabetes and Tips for Adding Protein handouts added to discharge summary.  NUTRITION DIAGNOSIS:   Increased nutrient needs related to wound healing as evidenced by estimated needs.  GOAL:   Patient will meet greater than or equal to 90% of their needs  MONITOR:   PO intake, Supplement acceptance, Skin  REASON FOR ASSESSMENT:   Consult Wound healing  ASSESSMENT:   63 yo female admitted with R second toe gangrene. PMH includes DM-2, CKD stage IV (on renal transplant list at [facility]), CAD, HLD, HTN, GERD.  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 Patient reports usual weight 179 lbs.  She has had a decreased appetite and poor intake since Monday. She does not want to drink any milky supplements, like Ensure or Glucerna, but agreed to try Prosource Plus protein supplement. We reviewed good sources of dietary protein to promote healing.  Also discussed the importance of good glycemic control to support healing.   Labs reviewed.  CBG: 190-168  Medications reviewed and include vitamin D , novolog , semglee, synthroid.  Weight history reviewed.  06/30/23: 84.5 kg 12/07/23: 81.2 kg 4% weight loss in 6 months is not significant.  NUTRITION - FOCUSED PHYSICAL EXAM:  Flowsheet Row Most Recent Value  Orbital Region No depletion  Upper Arm Region No depletion  Thoracic and Lumbar Region No depletion  Buccal Region No depletion  Temple Region No depletion  Clavicle Bone Region No depletion  Clavicle and Acromion Bone Region No depletion  Scapular Bone Region No depletion  Dorsal Hand No depletion  Patellar Region Unable to assess  Anterior Thigh Region Unable to assess   Posterior Calf Region Unable to assess  Edema (RD Assessment) Unable to assess  Hair Reviewed  Eyes Reviewed  Mouth Reviewed  Skin Reviewed  Nails Reviewed    Diet Order:   Diet Order             Diet Carb Modified Fluid consistency: Thin  Diet effective now                   EDUCATION NEEDS:   Not appropriate for education at this time  Skin:  Skin Assessment: Skin Integrity Issues: Skin Integrity Issues:: Incisions Incisions: R second toe amputation  Last BM:  12/2  Height:   Ht Readings from Last 1 Encounters:  12/07/23 5' 7 (1.702 m)    Weight:   Wt Readings from Last 1 Encounters:  12/07/23 81.2 kg    Ideal Body Weight:  61.4 kg  BMI:  Body mass index is 28.04 kg/m.  Estimated Nutritional Needs:   Kcal:  1900-2100  Protein:  100-115 gm  Fluid:  1.9-2.1 L   Suzen HUNT RD, LDN, CNSC Contact via secure chat. If unavailable, use group chat RD Inpatient.

## 2023-12-07 NOTE — Interval H&P Note (Signed)
 History and Physical Interval Note:  12/07/2023 9:19 AM  Brianna [name]  has presented today for surgery, with the diagnosis of Right second toe gangrene.  The various methods of treatment have been discussed with the patient and family. After consideration of risks, benefits and other options for treatment, the patient has consented to  Procedure(s): AMPUTATION, TOE (Right) as a surgical intervention.  The patient's history has been reviewed, patient examined, no change in status, stable for surgery.  I have reviewed the patient's chart and labs.  Questions were answered to the patient's satisfaction.     Oneil Budge

## 2023-12-07 NOTE — Anesthesia Postprocedure Evaluation (Signed)
 Anesthesia Post Note  Patient: Brianna [name]  Procedure(s) Performed: AMPUTATION, TOE (Right: Toe)  Patient location during evaluation: PACU Anesthesia Type: Regional Level of consciousness: awake and alert Pain management: pain level controlled Vital Signs Assessment: post-procedure vital signs reviewed and stable Respiratory status: spontaneous breathing, nonlabored ventilation, respiratory function stable and patient connected to nasal cannula oxygen Cardiovascular status: blood pressure returned to baseline and stable Postop Assessment: no apparent nausea or vomiting Anesthetic complications: no   No notable events documented.   Last Vitals:  Vitals:   12/07/23 1031 12/07/23 1045  BP: (!) 144/83 (!) 151/84  Pulse:    Resp: 10 10  Temp:    SpO2:  100%    Last Pain:  Vitals:   12/07/23 1045  TempSrc:   PainSc: 0-No pain                 Andrea Limes

## 2023-12-08 ENCOUNTER — Encounter (HOSPITAL_COMMUNITY): Payer: Self-pay | Admitting: General Surgery

## 2023-12-08 ENCOUNTER — Inpatient Hospital Stay (HOSPITAL_COMMUNITY)

## 2023-12-08 DIAGNOSIS — M869 Osteomyelitis, unspecified: Secondary | ICD-10-CM | POA: Diagnosis not present

## 2023-12-08 LAB — GLUCOSE, CAPILLARY
Glucose-Capillary: 158 mg/dL — ABNORMAL HIGH (ref 70–99)
Glucose-Capillary: 163 mg/dL — ABNORMAL HIGH (ref 70–99)
Glucose-Capillary: 127 mg/dL — ABNORMAL HIGH (ref 70–99)

## 2023-12-08 LAB — BASIC METABOLIC PANEL WITH GFR
Anion gap: 10 (ref 5–15)
BUN: 48 mg/dL — ABNORMAL HIGH (ref 8–23)
CO2: 20 mmol/L — ABNORMAL LOW (ref 22–32)
Calcium: 8.2 mg/dL — ABNORMAL LOW (ref 8.9–10.3)
Chloride: 110 mmol/L (ref 98–111)
Creatinine, Ser: 4.98 mg/dL — ABNORMAL HIGH (ref 0.44–1.00)
GFR, Estimated: 9 mL/min — ABNORMAL LOW (ref >60)
Glucose, Bld: 197 mg/dL — ABNORMAL HIGH (ref 70–99)
Potassium: 4.5 mmol/L (ref 3.5–5.1)
Sodium: 140 mmol/L (ref 135–145)

## 2023-12-08 LAB — CBC
HCT: 23.7 % — ABNORMAL LOW (ref 36.0–46.0)
Hemoglobin: 7.2 g/dL — ABNORMAL LOW (ref 12.0–15.0)
MCH: 28.3 pg (ref 26.0–34.0)
MCHC: 30.4 g/dL (ref 30.0–36.0)
MCV: 93.3 fL (ref 80.0–100.0)
Platelets: 353 K/uL (ref 150–400)
RBC: 2.54 MIL/uL — ABNORMAL LOW (ref 3.87–5.11)
RDW: 14.9 % (ref 11.5–15.5)
WBC: 8.4 K/uL (ref 4.0–10.5)
nRBC: 0 % (ref 0.0–0.2)

## 2023-12-08 LAB — OSMOLALITY, URINE: Osmolality, Ur: 397 mosm/kg (ref 300–900)

## 2023-12-08 LAB — PHOSPHORUS: Phosphorus: 3.8 mg/dL (ref 2.5–4.6)

## 2023-12-08 LAB — HEMOGLOBIN A1C
Hgb A1c MFr Bld: 8.5 % — ABNORMAL HIGH (ref 4.8–5.6)
Mean Plasma Glucose: 197 mg/dL

## 2023-12-08 LAB — MAGNESIUM: Magnesium: 1.6 mg/dL — ABNORMAL LOW (ref 1.7–2.4)

## 2023-12-08 LAB — SODIUM, URINE, RANDOM: Sodium, Ur: 55 mmol/L

## 2023-12-08 MED ORDER — PROCHLORPERAZINE EDISYLATE 10 MG/2ML IJ SOLN
10.0000 mg | Freq: Once | INTRAMUSCULAR | Status: AC
  Administered 2023-12-08: 10 mg via INTRAVENOUS
  Filled 2023-12-08: qty 2

## 2023-12-08 MED ORDER — DARBEPOETIN ALFA 150 MCG/0.3ML IJ SOSY
150.0000 ug | PREFILLED_SYRINGE | Freq: Once | INTRAMUSCULAR | Status: AC
  Administered 2023-12-08: 150 ug via SUBCUTANEOUS
  Filled 2023-12-08: qty 0.3

## 2023-12-08 MED ORDER — SODIUM CHLORIDE 0.9 % IV SOLN
INTRAVENOUS | Status: AC
  Administered 2023-12-09: 100 mL/h via INTRAVENOUS

## 2023-12-08 NOTE — Plan of Care (Signed)
   Problem: Education: Goal: Knowledge of General Education information will improve Description: Including pain rating scale, medication(s)/side effects and non-pharmacologic comfort measures Outcome: Progressing   Problem: Clinical Measurements: Goal: Ability to maintain clinical measurements within normal limits will improve Outcome: Progressing

## 2023-12-08 NOTE — Progress Notes (Addendum)
 PROGRESS NOTE    Brianna [name]  FMW:[id number] DOB: 06-10-1960 DOA: 12/06/2023 PCP: Kristine Corean Deed, NP   Brief Narrative:    63 y.o. female with medical history significant of multiple comorbidities including insulin  dependent type 2 Diabetes mellitus, CKD stage IV, on renal transplant list, moderate left-sided carotid artery disease, hyperlipidemia, essential hypertension, amongst other medical problems, who presented the emergency room with chief complaint of worsening right second toe infection.  Xray of the right foot was done which showed evidence of early osteomyelitis of the right second toe distal phalanx.   General surgery consulted S/p right 2nd toe amputation done on 12/4. Developed AKI on CKD IV on 12/5, consulted nephrology. F/u renal US , bladder scan and urine studies.  Assessment & Plan:  Principal Problem:   Osteomyelitis of toe of right foot (HCC) Active Problems:   Dry gangrene (HCC)   Osteomyelitis of second toe of right foot (HCC)   Dry gangrene of right second toe,foul smelling,POA: Osteomyelitis of second right toe,POA: Mild b/l LE peripheral arterial disease,POA: -She was started on IV Linezolid , Flagyl  and Cefepime . Dced both cefepime  and flagyl  on 12/5. -ESR and CRP highly elevated US  arterial duplex showed Left ABI 0.89 and right ABI 0.92, consistent with mild peripheral arterial disease in both lower extremities, Monophasic distal arterial waveforms in both lower extremities, suggesting distal arterial insufficiency. S/p right second toe amputation done on 12/4   Hypertensive urgency,POA: resolved -SBPs in early 200s in ED -Resumed coreg  3.125 po BID  -Dced oral hydralazine  as bp is on the softer side now -Ordered IV hydralazine  10 mg prn for SBP > 160 mmHg.   Insulin  dependent type 2 diabetes mellitus complicated by lower extremity peripheral neuropathy, diabetic nephropathy as well as diabetic foot infection,POA: -Ordered SSI,  long acting as well as short acting scheduled pre meal insulin  F/u HbA1c -Resumed gabapentin  300 mg PO TID for neuropathy symptoms.   Left-sided moderate carotid artery stenosis, POA: Continue with aspirin  and statin.  Outpatient follow-up with vascular surgeon.   AKI on CKD IV: Cr today is 4.98. Baseline creatinine is between 3.3-3.4. Possible ischemic ATN -On renal [facility] list at [facility]  -Ordered bladder scan, renal US  and urine osmolality and sodium levels -Consulted nephrology -Started on IVF NS at 100 cc/hr   Disposition: Home with HHS.     DVT prophylaxis: heparin  injection 5,000 Units Start: 12/06/23 1400     Code Status: Full Code Family Communication:  None at the bedside Status is: Inpatient Remains inpatient appropriate because: toe gangrene, osteomyelitis    Subjective:  She had nausea and vomiting last night. She is also complaining of right foot pain after the surgery. We spoke about adjusting her antibiotics today.  Examination:  Constitutional: NAD, calm, comfortable Eyes: PERRL, lids and conjunctivae normal ENMT: Mucous membranes are moist. Posterior pharynx clear of any exudate or lesions.Normal dentition.  Neck: normal, supple, no masses, no thyromegaly Respiratory: clear to auscultation bilaterally, no wheezing, no crackles. Normal respiratory effort. No accessory muscle use.  Cardiovascular: Regular rate and rhythm, no murmurs / rubs / gallops. No extremity edema. 2+ pedal pulses. No carotid bruits.  Abdomen: no tenderness, no masses palpated. No hepatosplenomegaly. Bowel sounds positive.  Musculoskeletal: Right foot dressing in place, no discharge noted Neurologic: CN 2-12 grossly intact. Sensation intact, DTR normal. Strength 5/5 x all 4 extremities.  Psychiatric: Normal judgment and insight. Alert and oriented x 3. Normal mood.        Diet Orders (From admission, onward)  Start     Ordered   12/07/23 1111  Diet Carb Modified Fluid  consistency: Thin  Diet effective now       Question Answer Comment  Calorie Level Medium 1600-2000   Fluid consistency: Thin      12/07/23 1110            Objective: Vitals:   12/07/23 1600 12/07/23 1820 12/07/23 1900 12/08/23 0444  BP: 115/74 132/84 118/68 125/66  Pulse: (!) 102 (!) 102    Resp:    20  Temp: 98.8 F (37.1 C) 98.4 F (36.9 C) 98.8 F (37.1 C) 98 F (36.7 C)  TempSrc:  Oral Oral Oral  SpO2: 96% 94% 97% 95%  Weight:      Height:        Intake/Output Summary (Last 24 hours) at 12/08/2023 0810 Last data filed at 12/08/2023 0543 Gross per 24 hour  Intake 1020.83 ml  Output --  Net 1020.83 ml   Filed Weights   12/06/23 0800 12/07/23 0848  Weight: 81.2 kg 81.2 kg    Scheduled Meds:  (feeding supplement) PROSource Plus  30 mL Oral BID AC   aspirin   81 mg Oral Daily   atorvastatin   80 mg Oral Daily   carvedilol   3.125 mg Oral BID WC   cholecalciferol   2,000 Units Oral Daily   gabapentin   300 mg Oral TID   heparin   5,000 Units Subcutaneous Q8H   insulin  aspart  0-5 Units Subcutaneous QHS   insulin  aspart  0-9 Units Subcutaneous TID WC   insulin  aspart  2 Units Subcutaneous TID WC   insulin  glargine-yfgn  5 Units Subcutaneous QHS   levothyroxine   75 mcg Oral Daily   multivitamin with minerals  1 tablet Oral Daily   pantoprazole  (PROTONIX ) IV  40 mg Intravenous Daily   Continuous Infusions:  ceFEPime  (MAXIPIME ) IV Stopped (12/07/23 1350)   linezolid  (ZYVOX ) IV Stopped (12/07/23 2237)   metronidazole  500 mg (12/08/23 0057)    Nutritional status Signs/Symptoms: estimated needs Interventions: MVI, Glucerna shake, Prostat Body mass index is 28.04 kg/m.  Data Reviewed:   CBC: Recent Labs  Lab 12/06/23 0843 12/06/23 0844 12/07/23 0414 12/08/23 0411  WBC  --  9.3 8.0 8.4  NEUTROABS  --  6.6  --   --   HGB 8.5* 8.3* 7.4* 7.2*  HCT 25.0* 26.3* 23.5* 23.7*  MCV  --  91.3 91.8 93.3  PLT  --  431* 365 353   Basic Metabolic Panel: Recent  Labs  Lab 12/06/23 0843 12/06/23 0844 12/07/23 0414 12/08/23 0411  NA 144 141 141 140  K 4.1 4.0 4.2 4.5  CL 112* 109 110 110  CO2  --  18* 21* 20*  GLUCOSE 275* 273* 204* 197*  BUN 42* 43* 43* 48*  CREATININE 3.80* 3.38* 3.83* 4.98*  CALCIUM   --  9.4 8.7* 8.2*  MG  --   --   --  1.6*  PHOS  --   --   --  3.8   GFR: Estimated Creatinine Clearance: 13 mL/min (A) (by C-G formula based on SCr of 4.98 mg/dL (H)). Liver Function Tests: Recent Labs  Lab 12/06/23 0844  AST 19  ALT 12  ALKPHOS 148*  BILITOT 0.3  PROT 8.1  ALBUMIN 3.5   No results for input(s): LIPASE, AMYLASE in the last 168 hours. No results for input(s): AMMONIA in the last 168 hours. Coagulation Profile: No results for input(s): INR, PROTIME in the last 168 hours. Cardiac  Enzymes: No results for input(s): CKTOTAL, CKMB, CKMBINDEX, TROPONINI in the last 168 hours. BNP (last 3 results) No results for input(s): PROBNP in the last 8760 hours. HbA1C: Recent Labs    12/06/23 0844  HGBA1C 8.5*   CBG: Recent Labs  Lab 12/07/23 1031 12/07/23 1127 12/07/23 1624 12/07/23 2001 12/08/23 0720  GLUCAP 219* 174* 210* 177* 158*   Lipid Profile: Recent Labs    12/06/23 0844  CHOL 210*  HDL 40*  LDLCALC 137*  TRIG 167*  CHOLHDL 5.3   Thyroid Function Tests: Recent Labs    12/06/23 0844  TSH 1.700   Anemia Panel: No results for input(s): VITAMINB12, FOLATE, FERRITIN, TIBC, IRON, RETICCTPCT in the last 72 hours. Sepsis Labs: No results for input(s): PROCALCITON, LATICACIDVEN in the last 168 hours.  Recent Results (from the past 240 hours)  Blood culture (routine x 2)     Status: None (Preliminary result)   Collection Time: 12/06/23  8:44 AM   Specimen: BLOOD  Result Value Ref Range Status   Specimen Description BLOOD BLOOD RIGHT ARM  Final   Special Requests   Final    BOTTLES DRAWN AEROBIC AND ANAEROBIC Blood Culture adequate volume   Culture   Final    NO  GROWTH 1 DAY Performed at [facility], [address], [place], [place] [address]    Report Status PENDING  Incomplete  Blood culture (routine x 2)     Status: None (Preliminary result)   Collection Time: 12/06/23  8:44 AM   Specimen: BLOOD  Result Value Ref Range Status   Specimen Description BLOOD BLOOD LEFT ARM  Final   Special Requests   Final    BOTTLES DRAWN AEROBIC AND ANAEROBIC Blood Culture adequate volume   Culture   Final    NO GROWTH 1 DAY Performed at [facility], [address] [place], [place] [address]    Report Status PENDING  Incomplete  Surgical pcr screen     Status: None   Collection Time: 12/07/23  6:40 AM   Specimen: Nasal Mucosa; Nasal Swab  Result Value Ref Range Status   MRSA, PCR NEGATIVE NEGATIVE Final   Staphylococcus aureus NEGATIVE NEGATIVE Final    Comment: (NOTE) The Xpert SA Assay (FDA approved for NASAL specimens in patients 44 years of age and older), is one component of a comprehensive surveillance program. It is not intended to diagnose infection nor to guide or monitor treatment. Performed at [facility], [address] [place], [place] [address]          Radiology Studies: US  ARTERIAL ABI (SCREENING LOWER EXTREMITY) Result Date: 12/06/2023 EXAM: VASCULAR SCREENING 12/06/2023 12:53:29 PM CLINICAL HISTORY: [id number] Gangrene of toe of right foot (HCC) [id number] Gangrene of toe of right foot (HCC) [id number] TECHNIQUE: ultrasound   ABIs were  acquired. COMPARISON: None available. FINDINGS: ANKLE BRACHIAL INDEX: Right: 0.92 Left: 0.89 Monophasic arterial waveforms are recorded distally in both lower extremities. IMPRESSION: 1. Left ABI 0.89 and right ABI 0.92, consistent with mild peripheral arterial disease in both lower extremities. 2. Monophasic distal arterial waveforms in both lower extremities, suggesting distal arterial insufficiency. Electronically signed by: Katheleen Faes MD 12/06/2023 01:58 PM EST RP Workstation: [id number]   DG Foot  Complete Right Result Date: 12/06/2023 CLINICAL DATA:  Right second toe infection. EXAM: RIGHT FOOT COMPLETE - 3+ VIEW COMPARISON:  None Available. FINDINGS: There is no evidence of fracture or dislocation. Subtle loss of cortical integrity along the distal aspect of the second distal phalanx  is suspicious for early osteomyelitis. No soft tissue gas or foreign body. IMPRESSION: Subtle loss of cortical integrity along the distal aspect of the second distal phalanx is suspicious for early osteomyelitis. Electronically Signed   By: Marcey Moan M.D.   On: 12/06/2023 08:45           LOS: 2 days   Time spent= 39 mins    Deliliah Room, MD [facility]  If 7PM-7AM, please contact night-coverage  12/08/2023, 8:10 AM

## 2023-12-08 NOTE — Progress Notes (Signed)
 1 Day Post-Op  Subjective: Patient still with nausea and cannot tolerate a diet.  Did have emesis.  Minimal incisional pain in right foot.  Objective: Vital signs in last 24 hours: Temp:  [97.6 F (36.4 C)-98.8 F (37.1 C)] 98 F (36.7 C) (12/05 0444) Pulse Rate:  [87-103] 88 (12/05 0850) Resp:  [10-20] 20 (12/05 0850) BP: (109-180)/(60-93) 151/68 (12/05 0850) SpO2:  [94 %-100 %] 95 % (12/05 0444) Last BM Date : (P) 12/08/23  Intake/Output from previous day: 12/04 0701 - 12/05 0700 In: 1020.8 [P.O.:240; IV Piggyback:780.8] Out: -  Intake/Output this shift: Total I/O In: -  Out: 150 [Other:150]  General appearance: alert, cooperative, and fatigued Extremities: Right foot dressing dry and intact.  Lab Results:  Recent Labs    12/07/23 0414 12/08/23 0411  WBC 8.0 8.4  HGB 7.4* 7.2*  HCT 23.5* 23.7*  PLT 365 353   BMET Recent Labs    12/07/23 0414 12/08/23 0411  NA 141 140  K 4.2 4.5  CL 110 110  CO2 21* 20*  GLUCOSE 204* 197*  BUN 43* 48*  CREATININE 3.83* 4.98*  CALCIUM  8.7* 8.2*   PT/INR No results for input(s): LABPROT, INR in the last 72 hours.  Studies/Results: US  ARTERIAL ABI (SCREENING LOWER EXTREMITY) Result Date: 12/06/2023 EXAM: VASCULAR SCREENING 12/06/2023 12:53:29 PM CLINICAL HISTORY: [id number] Gangrene of toe of right foot (HCC) [id number] Gangrene of toe of right foot (HCC) [id number] TECHNIQUE: ultrasound   ABIs were  acquired. COMPARISON: None available. FINDINGS: ANKLE BRACHIAL INDEX: Right: 0.92 Left: 0.89 Monophasic arterial waveforms are recorded distally in both lower extremities. IMPRESSION: 1. Left ABI 0.89 and right ABI 0.92, consistent with mild peripheral arterial disease in both lower extremities. 2. Monophasic distal arterial waveforms in both lower extremities, suggesting distal arterial insufficiency. Electronically signed by: Katheleen Faes MD 12/06/2023 01:58 PM EST RP Workstation: [id number]    Anti-infectives: Anti-infectives  (From admission, onward)    Start     Dose/Rate Route Frequency Ordered Stop   12/06/23 1300  metroNIDAZOLE  (FLAGYL ) IVPB 500 mg  Status:  Discontinued        500 mg 100 mL/hr over 60 Minutes Intravenous Every 12 hours 12/06/23 1219 12/08/23 0813   12/06/23 1230  linezolid  (ZYVOX ) IVPB 600 mg        600 mg 300 mL/hr over 60 Minutes Intravenous Every 12 hours 12/06/23 1217     12/06/23 1230  ceFEPIme  (MAXIPIME ) 2 g in sodium chloride  0.9 % 100 mL IVPB  Status:  Discontinued        2 g 200 mL/hr over 30 Minutes Intravenous Every 24 hours 12/06/23 1218 12/08/23 0813       Assessment/Plan: s/p Procedure(s): AMPUTATION, TOE Impression: Postoperative day 1.  Dressing intact.  Has been seen by PT.  Will take off dressing tomorrow.  Continue IV antibiotics at the present time.  LOS: 2 days    Oneil Budge 12/08/2023

## 2023-12-08 NOTE — TOC Initial Note (Signed)
 Transition of Care [facility]) - Initial/Assessment Note    Patient Details  Name: Brianna [name] MRN: [id number] Date of Birth: 03-30-1960  Transition of Care [facility]) CM/SW Contact:    Lucie Lunger, LCSWA Phone Number: 12/08/2023, 9:47 AM  Clinical Narrative:                 CSW updated that pt will need [facility] RN for wound care at D/C. Spoke with pt about this, she is agreeable to this being arranged. [facility] states they are not in network. CSW spoke to Melissa with [facility] who confirms they are in network with pts insurance and can accept pt for  [facility] RN. Referral to be faxed to [phone] for further review. CSW spoke with pt about this and she is agreeable to [facility] with [facility]. TOC to follow.   Expected Discharge Plan: Home w Home Health Services Barriers to Discharge: Continued Medical Work up   Patient Goals and CMS Choice Patient states their goals for this hospitalization and ongoing recovery are:: return home CMS Medicare.gov Compare Post Acute Care list provided to:: Patient Choice offered to / list presented to : Patient      Expected Discharge Plan and Services In-house Referral: Clinical Social Work Discharge Planning Services: CM Consult Post Acute Care Choice: Home Health Living arrangements for the past 2 months: Single Family Home                           HH Arranged: RN HH Agency: [facility] Date HH Agency Contacted: 12/08/23   Representative spoke with at [facility] Agency: Melissa  Prior Living Arrangements/Services Living arrangements for the past 2 months: Single Family Home Lives with:: Self Patient language and need for interpreter reviewed:: Yes Do you feel safe going back to the place where you live?: Yes      Need for Family Participation in Patient Care: Yes (Comment) Care giver support system in place?: Yes (comment) Current home services: Home RN Criminal Activity/Legal Involvement Pertinent to Current Situation/Hospitalization: No -  Comment as needed  Activities of Daily Living   ADL Screening (condition at time of admission) Independently performs ADLs?: Yes (appropriate for developmental age) Is the patient deaf or have difficulty hearing?: No Does the patient have difficulty seeing, even when wearing glasses/contacts?: No Does the patient have difficulty concentrating, remembering, or making decisions?: No  Permission Sought/Granted                  Emotional Assessment Appearance:: Appears stated age Attitude/Demeanor/Rapport: Engaged Affect (typically observed): Accepting Orientation: : Oriented to Self, Oriented to  Time, Oriented to Place, Oriented to Situation Alcohol / Substance Use: Not Applicable Psych Involvement: No (comment)  Admission diagnosis:  Osteomyelitis of toe of right foot (HCC) [M86.9] Dry gangrene (HCC) [I96] Osteomyelitis of second toe of right foot (HCC) [M86.9] Patient Active Problem List   Diagnosis Date Noted   Osteomyelitis of second toe of right foot (HCC) 12/07/2023   Osteomyelitis of toe of right foot (HCC) 12/06/2023   Dry gangrene (HCC) 12/06/2023   Vitamin D  deficiency 10/22/2019   Uncontrolled type 2 diabetes mellitus with hyperglycemia (HCC) 09/10/2019   Mixed hyperlipidemia 09/10/2019   Essential hypertension, benign 09/10/2019   Normocytic anemia 12/19/2018   Family history of systemic lupus erythematosus 02/23/2018   History of diabetes mellitus 02/23/2018   Heartburn    Adult hypertrophic pyloric stenosis    Dysphagia 08/16/2017   GERD (gastroesophageal reflux disease) 11/21/2016  Heart rate fast 04/21/2014   Left flank pain 08/27/2012   Loss of weight 08/27/2012   Constipation 03/12/2012   Abdominal wall pain 03/12/2012   PCP:  Kristine Corean Deed, NP Pharmacy:   [facility] [address], [place] - [address] [place] [address] Phone: [phone] Fax: [phone]  [id number]  [place], [place] - [address] [facility] [address] [place] [address] Phone: [phone] Fax: [phone]     Social Drivers of Health (SDOH) Social History: SDOH Screenings   Food Insecurity: No Food Insecurity (12/06/2023)  Housing: Low Risk  (12/06/2023)  Transportation Needs: No Transportation Needs (12/06/2023)  Utilities: Not At Risk (12/06/2023)  Depression (PHQ2-9): Low Risk  (10/07/2019)  Tobacco Use: Low Risk  (12/07/2023)   SDOH Interventions:     Readmission Risk Interventions    12/07/2023    1:24 PM  Readmission Risk Prevention Plan  Transportation Screening Complete  PCP or Specialist Appt within 5-7 Days Complete  Home Care Screening Complete  Medication Review (RN CM) Complete

## 2023-12-08 NOTE — Progress Notes (Signed)
 Physical Therapy Treatment Patient Details Name: Brianna [name] MRN: [id number] DOB: October 09, 1960 Today's Date: 12/08/2023   History of Present Illness Brianna [name] is a 63 y.o. female with medical history significant of multiple comorbidities including insulin  dependent type 2 Diabetes mellitus, CKD stage IV, on renal transplant list, moderate left-sided carotid artery disease, hyperlipidemia, essential hypertension, amongst other medical problems, who presented the emergency room with chief complaint of worsening right second toe infection.    She said that she was seen by podiatrist in [place], [place]  for right second toe infection and was prescribed doxycycline 1 week back.  She took 4 doses and then started having nausea and vomiting so she could not tolerate it.  She does use insulin  at home for management of diabetes mellitus along with blood pressure medications for management of essential hypertension.  She said that she is on renal transplant list at [facility] and she is supposed to follow-up with them this month.    PT Comments  Patient agreeable to PT treatment although she reports tiredness. Patient was received supine in bed. Patient already had rearfoot WB boot donned in bed but required assist to adjust for proper fit. Patient was modified independent with bed mobility and required min assist at most during transfers and ambulation with RW, as pt experiences lightheadedness/dizziness, loses of balance, and vomiting towards end of ambulation. Pt nurse arrives and administers medication for N/V to address. Reminded pt of weight bearing precautions- to bear weight through rear foot only. Pt is able to maintain precautions during ambulation trial. Pt is left with nursing staff, sitting EOB, call button in reach, and all needs met. Patient will benefit from continued skilled physical therapy acutely and in recommended venue in order to address current deficits and  return to PLOF.     If plan is discharge home, recommend the following: A little help with walking and/or transfers;Assist for transportation;Assistance with cooking/housework;Help with stairs or ramp for entrance   Can travel by private vehicle        Equipment Recommendations  None recommended by PT    Recommendations for Other Services       Precautions / Restrictions Precautions Precautions: Fall Recall of Precautions/Restrictions: Intact Restrictions Weight Bearing Restrictions Per Provider Order: No     Mobility  Bed Mobility Overal bed mobility: Modified Independent             General bed mobility comments: HOB slightly elevated, required inc time    Transfers Overall transfer level: Needs assistance Equipment used: Rolling walker (2 wheels) Transfers: Sit to/from Stand Sit to Stand: Contact guard assist, Min assist           General transfer comment: Pt required multiple attempts for STS from bed, ultimately requiring light min assist    Ambulation/Gait Ambulation/Gait assistance: Min assist, Contact guard assist Gait Distance (Feet): 45 Feet Assistive device: Rolling walker (2 wheels) Gait Pattern/deviations: Decreased step length - right, Decreased step length - left, Decreased stance time - right, Decreased stance time - left, Step-to pattern, Narrow base of support Gait velocity: Dec     General Gait Details: Pt ambulates in room and in hall with RW and CGA but instances of min A for steadying as pt loses balance ~2 times throughout. pt reports dizziness and light headedness towards end of ambulation trial, at very end pt experiences vomiting   Stairs             Wheelchair Mobility     Tilt  Bed    Modified Rankin (Stroke Patients Only)       Balance Overall balance assessment: Needs assistance Sitting-balance support: Bilateral upper extremity supported, Feet supported, No upper extremity supported Sitting balance-Leahy  Scale: Good Sitting balance - Comments: good EOB   Standing balance support: Bilateral upper extremity supported, During functional activity, Reliant on assistive device for balance Standing balance-Leahy Scale: Fair Standing balance comment: fair using RW                            Communication Communication Communication: No apparent difficulties  Cognition Arousal: Alert Behavior During Therapy: WFL for tasks assessed/performed   PT - Cognitive impairments: No apparent impairments                         Following commands: Intact      Cueing Cueing Techniques: Verbal cues  Exercises      General Comments        Pertinent Vitals/Pain Pain Assessment Pain Assessment: No/denies pain    Home Living                          Prior Function            PT Goals (current goals can now be found in the care plan section) Acute Rehab PT Goals Patient Stated Goal: return home PT Goal Formulation: With patient Time For Goal Achievement: 12/14/23 Potential to Achieve Goals: Good Progress towards PT goals: Progressing toward goals    Frequency    Min 3X/week      PT Plan      Co-evaluation              AM-PAC PT 6 Clicks Mobility   Outcome Measure  Help needed turning from your back to your side while in a flat bed without using bedrails?: None Help needed moving from lying on your back to sitting on the side of a flat bed without using bedrails?: None Help needed moving to and from a bed to a chair (including a wheelchair)?: A Little Help needed standing up from a chair using your arms (e.g., wheelchair or bedside chair)?: A Little Help needed to walk in hospital room?: A Little Help needed climbing 3-5 steps with a railing? : A Lot 6 Click Score: 19    End of Session Equipment Utilized During Treatment: Gait belt Activity Tolerance: Patient tolerated treatment well;Patient limited by fatigue;Other (comment) (limited  due to N/V) Patient left: with call bell/phone within reach;in bed;with nursing/sitter in room Nurse Communication: Mobility status PT Visit Diagnosis: Unsteadiness on feet (R26.81);Muscle weakness (generalized) (M62.81);Other (comment);Dizziness and giddiness (R42)     Time: 8569-8551 PT Time Calculation (min) (ACUTE ONLY): 18 min  Charges:    $Therapeutic Activity: 8-22 mins PT General Charges $$ ACUTE PT VISIT: 1 Visit                     3:04 PM, 12/08/23 Lourene Hoston Powell-Butler, PT, DPT [place] with [facility]

## 2023-12-08 NOTE — Consult Note (Signed)
 Reason for Consult: Renal failure Referring Physician: Dr. Deliliah Room  Chief Complaint: right toe infection  Assessment/Plan: CKD IV/V -already with a GFR of only 15 cc/min by CKD EPI in 06/2023 and certainly could have progressed vs superimposed toxic/ ischemic ATN from the superimposed infection + relative hypotension.  Blood pressures fluctuate at home but often is more in the 150-160's systolic range and currently she has been consistently in the 128-151 range post surgery on only a Bblr.  When the patient first presented earlier in hospitalization systolic blood pressures were in the 180-230 range. -She has persistent nausea but has been present well before hospitalization which can be attributed to the infection; will monitor closely and hopefully it does not represent uremia.  I educated her that it is possible she may be uremic if her renal disease continue to progress since June.  We will try to hold off on dialysis for as long as we can as there is no absolute indication. -She is on the transplant list at [facility] with a potential donor in her son but the son has not been worked up yet.  - Will follow closely with you  -Monitor Daily I/Os, Daily weight  -Maintain MAP>65 for optimal renal perfusion.  - Avoid nephrotoxic agents such as IV contrast, NSAIDs, and phosphate containing bowel preps (FLEETS)  Renal osteodystrophy -25 vitamin D , PTH; no binder needed at this current time.  Phosphorus is 3.8. Anemia - Hb 7.2; will give Aranesp  150mcg x1.  Transfuse as needed if hemoglobin drops below 7. 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 Diabetes - per primary Hypertension -very difficult as her blood pressure even at home historically fluctuates quite a bit but usually is in the 150-160 range systolic.  Blood pressure control is adequate although there is a reading of 180/93 currently only on carvedilol  with hydralazine  being given on a as needed basis.   HPI:  Brianna [name] is an 63 y.o. 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  She is here because of her right second toe lesion treated with doxycycline in November 2025 initially cellulitis but unfortunately the toe lesion worsened.  Of note she follows with podiatry in [place]; monophasic arterial waveforms in both lower extremities, patient had the right second toe amputation on 12/07/23.  His blood pressure earlier in the hospitalization was more in the 161-235 systolic range, MAP did drop from 90s to consistently 75 but it was a very short operation and mild drop.  Did not receive any contrast during this hospitalization.  Blood pressure was as high as 151-180 systolic and then consistently in the 120's-150's range.  Was started on linezolid  and Flagyl .   12/08/23 4.98 12/07/23 3.83 12/06/23 3.8 06/30/23 3.27  ROS Pertinent items are noted in HPI.  Chemistry and CBC: Creatinine  Date/Time Value Ref Range Status  06/25/2019 12:00 AM 1.0 0.5 - 1.1 Final   Creat  Date/Time Value Ref Range Status  02/01/2018 10:38 AM 0.76 0.50 - 1.05 mg/dL Final    Comment:    For patients >48 years of age, the reference limit for Creatinine is approximately 13% higher for people identified as African-American. SABRA   08/27/2012 01:10 PM 0.62 0.50 - 1.10 mg/dL  Final  02/27/2012 12:00 AM 0.49  Final   Creatinine, Ser  Date/Time Value Ref Range Status  12/08/2023 04:11 AM 4.98 (H) 0.44 - 1.00 mg/dL Final  87/95/7974 95:85 AM 3.83 (H) 0.44 - 1.00 mg/dL Final  87/96/7974 91:55 AM 3.38 (H) 0.44 - 1.00 mg/dL Final  87/96/7974 91:56 AM 3.80 (H) 0.44 - 1.00 mg/dL Final  93/72/7974 90:97 AM 3.27 (H) 0.57 -  1.00 mg/dL Final  96/80/7977 95:56 PM 1.40 (H) 0.44 - 1.00 mg/dL Final  88/87/7981 92:70 PM 0.54 0.44 - 1.00 mg/dL Final  90/78/7982 94:84 PM 0.54 0.44 - 1.00 mg/dL Final   Recent Labs  Lab 12/06/23 0843 12/06/23 0844 12/07/23 0414 12/08/23 0411  NA 144 141 141 140  K 4.1 4.0 4.2 4.5  CL 112* 109 110 110  CO2  --  18* 21* 20*  GLUCOSE 275* 273* 204* 197*  BUN 42* 43* 43* 48*  CREATININE 3.80* 3.38* 3.83* 4.98*  CALCIUM   --  9.4 8.7* 8.2*  PHOS  --   --   --  3.8   Recent Labs  Lab 12/06/23 0843 12/06/23 0844 12/07/23 0414 12/08/23 0411  WBC  --  9.3 8.0 8.4  NEUTROABS  --  6.6  --   --   HGB 8.5* 8.3* 7.4* 7.2*  HCT 25.0* 26.3* 23.5* 23.7*  MCV  --  91.3 91.8 93.3  PLT  --  431* 365 353   Liver Function Tests: Recent Labs  Lab 12/06/23 0844  AST 19  ALT 12  ALKPHOS 148*  BILITOT 0.3  PROT 8.1  ALBUMIN 3.5   No results for input(s): LIPASE, AMYLASE in the last 168 hours. No results for input(s): AMMONIA in the last 168 hours. Cardiac Enzymes: No results for input(s): CKTOTAL, CKMB, CKMBINDEX, TROPONINI in the last 168 hours. Iron Studies: No results for input(s): IRON, TIBC, TRANSFERRIN, FERRITIN in the last 72 hours. PT/INR: @LABRCNTIP (inr:5)  Xrays/Other Studies: ) Results for orders placed or performed during the hospital encounter of 12/06/23 (from the past 48 hours)  Glucose, capillary     Status: Abnormal   Collection Time: 12/06/23  9:16 PM  Result Value Ref Range   Glucose-Capillary 190 (H) 70 - 99 mg/dL    Comment: Glucose reference range applies only to samples taken after fasting for at least 8 hours.   Comment 1 Notify RN    Comment 2 Document in Chart   HIV Antibody (routine testing w rflx)     Status: None   Collection Time: 12/07/23  4:14 AM  Result Value Ref Range   HIV Screen 4th Generation wRfx Non Reactive Non Reactive    Comment: Performed at [facility], [address] [place], [place] [address]   Basic metabolic panel     Status: Abnormal   Collection Time: 12/07/23  4:14 AM  Result Value Ref Range   Sodium 141 135 - 145 mmol/L   Potassium 4.2 3.5 - 5.1 mmol/L   Chloride 110 98 - 111 mmol/L   CO2 21 (L) 22 - 32 mmol/L   Glucose, Bld 204 (H) 70 - 99 mg/dL    Comment: Glucose reference range applies only to samples taken after fasting for at least 8 hours.   BUN 43 (H) 8 - 23 mg/dL   Creatinine, Ser 6.16 (H) 0.44 - 1.00 mg/dL   Calcium  8.7 (L) 8.9 - 10.3 mg/dL   GFR, Estimated 13 (L) >60 mL/min    Comment: (NOTE) Calculated using the CKD-EPI  Creatinine Equation (2021)    Anion gap 10 5 - 15    Comment: Performed at [facility], [address] [place], [place] [address]  CBC     Status: Abnormal   Collection Time: 12/07/23  4:14 AM  Result Value Ref Range   WBC 8.0 4.0 - 10.5 K/uL   RBC 2.56 (L) 3.87 - 5.11 MIL/uL   Hemoglobin 7.4 (L) 12.0 - 15.0 g/dL   HCT 76.4 (L) 63.9 - 53.9 %   MCV 91.8 80.0 - 100.0 fL   MCH 28.9 26.0 - 34.0 pg   MCHC 31.5 30.0 - 36.0 g/dL   RDW 85.3 88.4 - 84.4 %   Platelets 365 150 - 400 K/uL   nRBC 0.0 0.0 - 0.2 %    Comment: Performed at [facility], [address], [place], [place] [address]  Surgical pcr screen     Status: None   Collection Time: 12/07/23  6:40 AM   Specimen: Nasal Mucosa; Nasal Swab  Result Value Ref Range   MRSA, PCR NEGATIVE NEGATIVE   Staphylococcus aureus NEGATIVE NEGATIVE    Comment: (NOTE) The Xpert SA Assay (FDA approved for NASAL specimens in patients 23 years of age and older), is one component of a comprehensive surveillance program. It is not intended to diagnose infection nor to guide or monitor treatment. Performed at [facility], [address] [place], [place] [address]   Glucose, capillary     Status: Abnormal   Collection Time: 12/07/23  7:46 AM  Result Value Ref Range   Glucose-Capillary 168 (H) 70 - 99 mg/dL    Comment: Glucose reference range applies only to samples taken after fasting for at  least 8 hours.  Glucose, capillary     Status: Abnormal   Collection Time: 12/07/23 10:31 AM  Result Value Ref Range   Glucose-Capillary 219 (H) 70 - 99 mg/dL    Comment: Glucose reference range applies only to samples taken after fasting for at least 8 hours.  Glucose, capillary     Status: Abnormal   Collection Time: 12/07/23 11:27 AM  Result Value Ref Range   Glucose-Capillary 174 (H) 70 - 99 mg/dL    Comment: Glucose reference range applies only to samples taken after fasting for at least 8 hours.  Troponin T, High Sensitivity     Status: Abnormal   Collection Time: 12/07/23  2:37 PM  Result Value Ref Range   Troponin T High Sensitivity 80 (H) 0 - 19 ng/L    Comment: (NOTE) Biotin concentrations > 1000 ng/mL falsely decrease TnT results.  Serial cardiac troponin measurements are suggested.  Refer to the Links section for chest pain algorithms and additional  guidance. Performed at [facility], [address] [place], [place] [address]   Troponin T, High Sensitivity     Status: Abnormal   Collection Time: 12/07/23  4:09 PM  Result Value Ref Range   Troponin T High Sensitivity 84 (H) 0 - 19 ng/L    Comment: (NOTE) Biotin concentrations > 1000 ng/mL falsely decrease TnT results.  Serial cardiac troponin measurements are suggested.  Refer to the Links section for chest pain algorithms and additional  guidance. Performed at [facility], [address], [place], [place] [address]   Glucose, capillary     Status: Abnormal   Collection Time: 12/07/23  4:24 PM  Result Value Ref Range   Glucose-Capillary 210 (H) 70 - 99 mg/dL    Comment: Glucose reference range applies only to samples  taken after fasting for at least 8 hours.  Glucose, capillary     Status: Abnormal   Collection Time: 12/07/23  8:01 PM  Result Value Ref Range   Glucose-Capillary 177 (H) 70 - 99 mg/dL    Comment: Glucose reference range applies only to samples taken after fasting for at least 8 hours.  Basic  metabolic panel     Status: Abnormal   Collection Time: 12/08/23  4:11 AM  Result Value Ref Range   Sodium 140 135 - 145 mmol/L   Potassium 4.5 3.5 - 5.1 mmol/L   Chloride 110 98 - 111 mmol/L   CO2 20 (L) 22 - 32 mmol/L   Glucose, Bld 197 (H) 70 - 99 mg/dL    Comment: Glucose reference range applies only to samples taken after fasting for at least 8 hours.   BUN 48 (H) 8 - 23 mg/dL   Creatinine, Ser 5.01 (H) 0.44 - 1.00 mg/dL   Calcium  8.2 (L) 8.9 - 10.3 mg/dL   GFR, Estimated 9 (L) >60 mL/min    Comment: (NOTE) Calculated using the CKD-EPI Creatinine Equation (2021)    Anion gap 10 5 - 15    Comment: Performed at [facility], [address], [place], [place] [address]  Magnesium     Status: Abnormal   Collection Time: 12/08/23  4:11 AM  Result Value Ref Range   Magnesium 1.6 (L) 1.7 - 2.4 mg/dL    Comment: Performed at [facility], [address] [place], [place] [address]  Phosphorus     Status: None   Collection Time: 12/08/23  4:11 AM  Result Value Ref Range   Phosphorus 3.8 2.5 - 4.6 mg/dL    Comment: Performed at [facility], [address], [place], [place] [address]  CBC     Status: Abnormal   Collection Time: 12/08/23  4:11 AM  Result Value Ref Range   WBC 8.4 4.0 - 10.5 K/uL   RBC 2.54 (L) 3.87 - 5.11 MIL/uL   Hemoglobin 7.2 (L) 12.0 - 15.0 g/dL   HCT 76.2 (L) 63.9 - 53.9 %   MCV 93.3 80.0 - 100.0 fL   MCH 28.3 26.0 - 34.0 pg   MCHC 30.4 30.0 - 36.0 g/dL   RDW 85.0 88.4 - 84.4 %   Platelets 353 150 - 400 K/uL   nRBC 0.0 0.0 - 0.2 %    Comment: Performed at [facility], [address], [place], [place] [address]  Glucose, capillary     Status: Abnormal   Collection Time: 12/08/23  7:20 AM  Result Value Ref Range   Glucose-Capillary 158 (H) 70 - 99 mg/dL    Comment: Glucose reference range applies only to samples taken after fasting for at least 8 hours.   Comment 1 Notify RN    Comment 2 Document in Chart    US  ARTERIAL ABI (SCREENING LOWER  EXTREMITY) Result Date: 12/06/2023 EXAM: VASCULAR SCREENING 12/06/2023 12:53:29 PM CLINICAL HISTORY: [id number] Gangrene of toe of right foot (HCC) 8368538 Gangrene of toe of right foot (HCC) [id number] TECHNIQUE: ultrasound   ABIs were  acquired. COMPARISON: None available. FINDINGS: ANKLE BRACHIAL INDEX: Right: 0.92 Left: 0.89 Monophasic arterial waveforms are recorded distally in both lower extremities. IMPRESSION: 1. Left ABI 0.89 and right ABI 0.92, consistent with mild peripheral arterial disease in both lower extremities. 2. Monophasic distal arterial waveforms in both lower extremities, suggesting distal arterial insufficiency. Electronically signed by: Dayne Hassell MD 12/06/2023 01:58 PM EST RP Workstation: [id number]  PMH:   Past Medical History:  Diagnosis Date   Diabetes mellitus (HCC)    Gastritis    GERD without esophagitis    Hypertension     PSH:   Past Surgical History:  Procedure Laterality Date   39 HOUR PH STUDY  01/10/2018   Procedure: 24 HOUR PH STUDY;  Surgeon: Shila Gustav GAILS, MD;  Location: [facility];  Service: Endoscopy;;   AMPUTATION TOE Right 12/07/2023   Procedure: AMPUTATION, TOE;  Surgeon: Mavis Anes, MD;  Location: [facility];  Service: General;  Laterality: Right;   CARPAL TUNNEL RELEASE     left   COLONOSCOPY WITH ESOPHAGOGASTRODUODENOSCOPY (EGD) N/A 04/02/2012   SLF: SMALL RECTAL polyp/Small internal hemorrhoids. Polyp hyperplastic. On EGD she had mild gastritis. Biopsy showed chronic and active gastritis and no H. pylori. Next colonoscopy in 10 years with overtube/? Propofol    ESOPHAGEAL DILATION  12/09/2016   Procedure: ESOPHAGEAL DILATION;  Surgeon: Harvey Margo CROME, MD;  Location: [facility];  Service: Endoscopy;;   ESOPHAGEAL MANOMETRY N/A 01/10/2018   Procedure: ESOPHAGEAL MANOMETRY (EM);  Surgeon: Shila Gustav GAILS, MD;  Location: [facility];  Service: Endoscopy;  Laterality: N/A;   ESOPHAGOGASTRODUODENOSCOPY  03/2012   SLF: gastritis    ESOPHAGOGASTRODUODENOSCOPY N/A 12/09/2016   Procedure: ESOPHAGOGASTRODUODENOSCOPY (EGD);  Surgeon: Harvey Margo CROME, MD;  Location: [facility];  Service: Endoscopy;  Laterality: N/A;  11:30am   ESOPHAGOGASTRODUODENOSCOPY N/A 08/22/2017   Procedure: ESOPHAGOGASTRODUODENOSCOPY (EGD);  Surgeon: Harvey Margo CROME, MD;  Location: [facility];  Service: Endoscopy;  Laterality: N/A;  11:30am   PARTIAL HYSTERECTOMY     PH IMPEDANCE STUDY N/A 01/10/2018   Procedure: PH IMPEDANCE STUDY;  Surgeon: Shila Gustav GAILS, MD;  Location: [facility];  Service: Endoscopy;  Laterality: N/A;   SAVORY DILATION N/A 08/22/2017   Procedure: SAVORY DILATION;  Surgeon: Harvey Margo CROME, MD;  Location: [facility];  Service: Endoscopy;  Laterality: N/A;   TRIGGER FINGER RELEASE Left     Allergies:  Allergies  Allergen Reactions   Other Hives and Shortness Of Breath    seafood   Bacitracin    Iodine Hives    shellfish   Tramadol     Neosporin [Neomycin-Bacitracin Zn-Polymyx] Rash    Medications:   Prior to Admission medications   Medication Sig Start Date End Date Taking? Authorizing Provider  acetaminophen  (TYLENOL ) 500 MG tablet Take 500 mg by mouth every 6 (six) hours as needed for moderate pain.   Yes [provider]  amitriptyline  (ELAVIL ) 50 MG tablet TAKE 1 TABLET BY MOUTH AT BEDTIME 07/30/18  Yes Ezzard Sonny RAMAN, PA-C  ammonium lactate (AMLACTIN) 12 % cream APPLY CREAM TOPICALLY TO AFFECTED AREA ONCE DAILY   Yes [provider]  ascorbic acid (VITAMIN C) 500 MG tablet Take 500 mg by mouth daily.   Yes [provider]  aspirin  81 MG chewable tablet Chew 81 mg by mouth daily.   Yes [provider]  atorvastatin  (LIPITOR) 80 MG tablet Take 1 tablet (80 mg total) by mouth daily. 11/03/22  Yes O'Neal, Darryle Ned, MD  carvedilol  (COREG ) 3.125 MG tablet Take 1 tablet (3.125 mg total) by mouth 2 (two) times daily with a meal. 06/30/23  Yes Monge, Damien BROCKS, NP   cholecalciferol  (VITAMIN D3) 25 MCG (1000 UT) tablet Take 2 tablets (2,000 Units total) by mouth daily. 12/19/18  Yes Fields, Sandi L, MD  collagenase (SANTYL) 250 UNIT/GM ointment  12/04/20  Yes [provider]  desonide (DESOWEN)  0.05 % cream Apply 1 Application topically 3 (three) times daily. For 1 days 12/13/11  Yes [provider]  gabapentin  (NEURONTIN ) 300 MG capsule Take 300 mg by mouth 3 (three) times daily.   Yes [provider]  hydrOXYzine (ATARAX) 25 MG tablet Take 1 tablet by mouth daily as needed. 05/24/22  Yes [provider]  Insulin  Aspart (NOVOLOG  [place]) Inject 10-16 Units into the skin See admin instructions.   Yes [provider]  levothyroxine  (SYNTHROID ) 75 MCG tablet Take 75 mcg by mouth daily.   Yes [provider]  omeprazole  (PRILOSEC OTC) 20 MG tablet Take 20 mg by mouth daily. For 14 days   Yes [provider]  ondansetron  (ZOFRAN -ODT) 4 MG disintegrating tablet DISSOLVE 1 TABLET IN MOUTH EVERY 8 HOURS AS NEEDED FOR NAUSEA.   Yes [provider]  pantoprazole  (PROTONIX ) 40 MG tablet 40 mg by oral route.   Yes [provider]  polyethylene glycol (MIRALAX / GLYCOLAX) packet Take 17 g by mouth daily as needed for mild constipation.    Yes [provider]  pregabalin (LYRICA) 75 MG capsule Take 75 mg by mouth 2 (two) times daily.   Yes [provider]  rosuvastatin  (CRESTOR ) 40 MG tablet Take 1 tablet by mouth daily.   Yes [provider]  silver sulfADIAZINE (SSD) 1 % cream  07/10/20  Yes [provider]  TRELEGY ELLIPTA 100-62.5-25 MCG/ACT AEPB Inhale 1 puff into the lungs daily. 11/01/23  Yes [provider]  triamcinolone cream (KENALOG) 0.1 % APPLY CREAM EXTERNALLY TO AFFECTED AREA TWICE A WEEK   Yes [provider]  Zinc 100 MG TABS Take 1 tablet by mouth daily.   Yes [provider]  Blood Glucose Monitoring Suppl (ONETOUCH  VERIO) w/Device KIT 1 each by Does not apply route as needed. 08/20/19   Nida, Gebreselassie W, MD  glucose blood (ONETOUCH VERIO) test strip Use as instructed 08/20/19   Nida, Gebreselassie W, MD  glucose blood test strip One touch verio glucose test strip.  Use as instructed 03/21/20   Triplett, Tammy, PA-C  SOLIQUA 100-33 UNT-MCG/ML SOPN INJECT 35-45 UNITS SUBCUTANEOUSLY ONCE DAILY TITRATING DOSE    [provider]    Discontinued Meds:   Medications Discontinued During This Encounter  Medication Reason   albuterol  (VENTOLIN  HFA) 108 (90 Base) MCG/ACT inhaler Patient Preference   aspirin  EC 81 MG tablet Patient Preference   traMADol  (ULTRAM ) 50 MG tablet Patient Preference   polyethylene glycol (GOLYTELY) 236 g solution Completed Course   omeprazole  (PRILOSEC) 40 MG capsule Patient Preference   olopatadine (PATANOL) 0.1 % ophthalmic solution Patient Preference   linaclotide  (LINZESS ) 145 MCG CAPS capsule Patient Preference   Insulin  Degludec (TRESIBA) 100 UNIT/ML SOLN Discontinued by provider   glipiZIDE (GLUCOTROL) 10 MG tablet Patient Preference   ferrous sulfate 325 (65 FE) MG tablet Patient Preference   Cholecalciferol  125 MCG (5000 UT) capsule Patient Preference   cetirizine (ZYRTEC ALLERGY) 10 MG tablet Patient Preference   Albuterol  Sulfate, sensor, (PROAIR  DIGIHALER) 108 (90 Base) MCG/ACT AEPB Patient Preference   albuterol  (VENTOLIN  HFA) 108 (90 Base) MCG/ACT inhaler Patient Preference   rosuvastatin  (CRESTOR ) tablet 40 mg    doxycycline (VIBRAMYCIN) 100 MG capsule    sodium chloride  irrigation 0.9 % Patient Discharge   lactated ringers  infusion    chlorhexidine  (PERIDEX ) 0.12 % solution 15 mL    Oral care mouth rinse    hydrALAZINE  (APRESOLINE ) tablet 25 mg  ceFEPIme  (MAXIPIME ) 2 g in sodium chloride  0.9 % 100 mL IVPB    metroNIDAZOLE  (FLAGYL ) IVPB 500 mg     Social History:  reports that she has never smoked. She has never used smokeless tobacco. She reports  that she does not drink alcohol and does not use drugs.  Family History:   Family History  Problem Relation Age of Onset   Stroke Mother    Breast cancer Mother    Hypertension Mother    Diabetes Father    Lupus Sister    Hypertension Brother    Diabetes Brother    Stroke Brother    Healthy Son    Healthy Son     Blood pressure (!) 151/68, pulse 88, temperature 98 F (36.7 C), temperature source Oral, resp. rate 20, height 5' 7 (1.702 m), weight 81.2 kg, SpO2 95%. General appearance: alert, cooperative, and appears stated age Head: Normocephalic, without obvious abnormality, atraumatic Eyes: negative Neck: no adenopathy, no carotid bruit, supple, symmetrical, trachea midline, and thyroid not enlarged, symmetric, no tenderness/mass/nodules Back: symmetric, no curvature. ROM normal. No CVA tenderness. Resp: clear to auscultation bilaterally Cardio: regular rate and rhythm GI: soft, non-tender; bowel sounds normal; no masses,  no organomegaly Extremities: No edema, right foot wrapped in dressing, not blood-tinged Pulses: 2+ and symmetric Skin: Skin color, texture, turgor normal. No rashes or lesions       Iysis Germain, LYNWOOD ORN, MD 12/08/2023, 8:54 AM

## 2023-12-09 DIAGNOSIS — M869 Osteomyelitis, unspecified: Secondary | ICD-10-CM | POA: Diagnosis not present

## 2023-12-09 LAB — BASIC METABOLIC PANEL WITH GFR
Anion gap: 9 (ref 5–15)
BUN: 40 mg/dL — ABNORMAL HIGH (ref 8–23)
CO2: 21 mmol/L — ABNORMAL LOW (ref 22–32)
Calcium: 8.2 mg/dL — ABNORMAL LOW (ref 8.9–10.3)
Chloride: 113 mmol/L — ABNORMAL HIGH (ref 98–111)
Creatinine, Ser: 4.53 mg/dL — ABNORMAL HIGH (ref 0.44–1.00)
GFR, Estimated: 10 mL/min — ABNORMAL LOW (ref >60)
Glucose, Bld: 186 mg/dL — ABNORMAL HIGH (ref 70–99)
Potassium: 4.2 mmol/L (ref 3.5–5.1)
Sodium: 142 mmol/L (ref 135–145)

## 2023-12-09 LAB — GLUCOSE, CAPILLARY
Glucose-Capillary: 168 mg/dL — ABNORMAL HIGH (ref 70–99)
Glucose-Capillary: 248 mg/dL — ABNORMAL HIGH (ref 70–99)
Glucose-Capillary: 205 mg/dL — ABNORMAL HIGH (ref 70–99)
Glucose-Capillary: 203 mg/dL — ABNORMAL HIGH (ref 70–99)

## 2023-12-09 LAB — VITAMIN D 25 HYDROXY (VIT D DEFICIENCY, FRACTURES): Vit D, 25-Hydroxy: 29.08 ng/mL — ABNORMAL LOW (ref 30–100)

## 2023-12-09 NOTE — Plan of Care (Signed)
 Problem: Education: Goal: Knowledge of General Education information will improve Description: Including pain rating scale, medication(s)/side effects and non-pharmacologic comfort measures Outcome: Progressing   Problem: Health Behavior/Discharge Planning: Goal: Ability to manage health-related needs will improve Outcome: Progressing   Problem: Clinical Measurements: Goal: Ability to maintain clinical measurements within normal limits will improve Outcome: Progressing Goal: Will remain free from infection Outcome: Progressing Goal: Diagnostic test results will improve Outcome: Progressing Goal: Respiratory complications will improve Outcome: Progressing Goal: Cardiovascular complication will be avoided Outcome: Progressing   Problem: Activity: Goal: Risk for activity intolerance will decrease Outcome: Progressing   Problem: Nutrition: Goal: Adequate nutrition will be maintained Outcome: Progressing   Problem: Coping: Goal: Level of anxiety will decrease Outcome: Progressing   Problem: Elimination: Goal: Will not experience complications related to bowel motility Outcome: Progressing Goal: Will not experience complications related to urinary retention Outcome: Progressing   Problem: Pain Managment: Goal: General experience of comfort will improve and/or be controlled Outcome: Progressing   Problem: Safety: Goal: Ability to remain free from injury will improve Outcome: Progressing   Problem: Skin Integrity: Goal: Risk for impaired skin integrity will decrease Outcome: Progressing   Problem: Education: Goal: Ability to describe self-care measures that may prevent or decrease complications (Diabetes Survival Skills Education) will improve Outcome: Progressing Goal: Individualized Educational Video(s) Outcome: Progressing   Problem: Coping: Goal: Ability to adjust to condition or change in health will improve Outcome: Progressing   Problem: Fluid  Volume: Goal: Ability to maintain a balanced intake and output will improve Outcome: Progressing   Problem: Health Behavior/Discharge Planning: Goal: Ability to identify and utilize available resources and services will improve Outcome: Progressing Goal: Ability to manage health-related needs will improve Outcome: Progressing   Problem: Metabolic: Goal: Ability to maintain appropriate glucose levels will improve Outcome: Progressing   Problem: Nutritional: Goal: Maintenance of adequate nutrition will improve Outcome: Progressing Goal: Progress toward achieving an optimal weight will improve Outcome: Progressing   Problem: Skin Integrity: Goal: Risk for impaired skin integrity will decrease Outcome: Progressing   Problem: Tissue Perfusion: Goal: Adequacy of tissue perfusion will improve Outcome: Progressing   Problem: Education: Goal: Knowledge of General Education information will improve Description: Including pain rating scale, medication(s)/side effects and non-pharmacologic comfort measures Outcome: Progressing   Problem: Health Behavior/Discharge Planning: Goal: Ability to manage health-related needs will improve Outcome: Progressing   Problem: Clinical Measurements: Goal: Ability to maintain clinical measurements within normal limits will improve Outcome: Progressing Goal: Will remain free from infection Outcome: Progressing Goal: Diagnostic test results will improve Outcome: Progressing Goal: Respiratory complications will improve Outcome: Progressing Goal: Cardiovascular complication will be avoided Outcome: Progressing   Problem: Activity: Goal: Risk for activity intolerance will decrease Outcome: Progressing   Problem: Nutrition: Goal: Adequate nutrition will be maintained Outcome: Progressing   Problem: Coping: Goal: Level of anxiety will decrease Outcome: Progressing   Problem: Elimination: Goal: Will not experience complications related to  bowel motility Outcome: Progressing Goal: Will not experience complications related to urinary retention Outcome: Progressing   Problem: Pain Managment: Goal: General experience of comfort will improve and/or be controlled Outcome: Progressing   Problem: Safety: Goal: Ability to remain free from injury will improve Outcome: Progressing   Problem: Skin Integrity: Goal: Risk for impaired skin integrity will decrease Outcome: Progressing   Problem: Education: Goal: Ability to describe self-care measures that may prevent or decrease complications (Diabetes Survival Skills Education) will improve Outcome: Progressing Goal: Individualized Educational Video(s) Outcome: Progressing   Problem: Coping: Goal: Ability  to adjust to condition or change in health will improve Outcome: Progressing   Problem: Fluid Volume: Goal: Ability to maintain a balanced intake and output will improve Outcome: Progressing   Problem: Health Behavior/Discharge Planning: Goal: Ability to identify and utilize available resources and services will improve Outcome: Progressing Goal: Ability to manage health-related needs will improve Outcome: Progressing   Problem: Metabolic: Goal: Ability to maintain appropriate glucose levels will improve Outcome: Progressing   Problem: Nutritional: Goal: Maintenance of adequate nutrition will improve Outcome: Progressing Goal: Progress toward achieving an optimal weight will improve Outcome: Progressing   Problem: Skin Integrity: Goal: Risk for impaired skin integrity will decrease Outcome: Progressing   Problem: Tissue Perfusion: Goal: Adequacy of tissue perfusion will improve Outcome: Progressing

## 2023-12-09 NOTE — Progress Notes (Signed)
 PROGRESS NOTE    Brianna [name]  FMW:[id number] DOB: [birth year] DOA: 12/06/2023 PCP: Kristine Corean Deed, NP   Brief Narrative:    63 y.o. female with medical history significant of multiple comorbidities including insulin  dependent type 2 Diabetes mellitus, CKD stage IV, on renal transplant list, moderate left-sided carotid artery disease, hyperlipidemia, essential hypertension, amongst other medical problems, who presented the emergency room with chief complaint of worsening right second toe infection.  Xray of the right foot was done which showed evidence of early osteomyelitis of the right second toe distal phalanx.   General surgery consulted S/p right 2nd toe amputation done on 12/4. Developed AKI on CKD IV on 12/5, consulted nephrology. AKI is improving on IVF F/u BMP  Assessment & Plan:  Principal Problem:   Osteomyelitis of toe of right foot (HCC) Active Problems:   Dry gangrene (HCC)   Osteomyelitis of second toe of right foot (HCC)   Dry gangrene of right second toe,foul smelling,POA: Osteomyelitis of second right toe,POA: Mild b/l LE peripheral arterial disease,POA: -She was started on IV Linezolid , Flagyl  and Cefepime . Dced both cefepime  and flagyl  on 12/5. Needs a total of 10 day antibiotic course. -ESR and CRP highly elevated US  arterial duplex showed Left ABI 0.89 and right ABI 0.92, consistent with mild peripheral arterial disease in both lower extremities, Monophasic distal arterial waveforms in both lower extremities, suggesting distal arterial insufficiency. S/p right second toe amputation done on 12/4 Outpatient f/u with Dr Mavis on 12/11.   Hypertensive urgency,POA: resolved -SBPs in early 200s in ED -Resumed coreg  3.125 po BID  -Dced oral hydralazine  as bp is on the softer side now -Ordered IV hydralazine  10 mg prn for SBP > 160 mmHg.   Insulin  dependent type 2 diabetes mellitus complicated by lower extremity peripheral neuropathy,  diabetic nephropathy as well as diabetic foot infection,POA: -Ordered SSI, long acting as well as short acting scheduled pre meal insulin  F/u HbA1c -Resumed gabapentin  300 mg PO TID for neuropathy symptoms.   Left-sided moderate carotid artery stenosis, POA: Continue with aspirin  and statin.  Outpatient follow-up with vascular surgeon.   AKI on CKD IV: Cr today is 4.5 down from 4.98. Baseline creatinine is between 3.3-3.4. Possible ischemic ATN -On renal transplant list at [facility]  -f/u renal US - medical renal disease. -Consulted nephrology -Continue with IVF NS at 100 cc/hr   Disposition: Home with HHS.     DVT prophylaxis: heparin  injection 5,000 Units Start: 12/06/23 1400     Code Status: Full Code Family Communication:  None at the bedside Status is: Inpatient Remains inpatient appropriate because: toe gangrene, osteomyelitis, AKI on CKD    Subjective:  No episodes of vomiting last night. She wants to be able to eat without vomiting. She is on IVF. We spoke about her improving renal function and the need to be on IVF since her oral intake is inadequate.  Examination:  Constitutional: NAD, calm, comfortable Eyes: PERRL, lids and conjunctivae normal ENMT: Mucous membranes are moist. Posterior pharynx clear of any exudate or lesions.Normal dentition.  Neck: normal, supple, no masses, no thyromegaly Respiratory: clear to auscultation bilaterally, no wheezing, no crackles. Normal respiratory effort. No accessory muscle use.  Cardiovascular: Regular rate and rhythm, no murmurs / rubs / gallops. No extremity edema. 2+ pedal pulses. No carotid bruits.  Abdomen: no tenderness, no masses palpated. No hepatosplenomegaly. Bowel sounds positive.  Musculoskeletal: Right foot dressing in place, no discharge noted Neurologic: CN 2-12 grossly intact. Sensation intact, DTR normal. Strength 5/5 x  all 4 extremities.  Psychiatric: Normal judgment and insight. Alert and oriented x 3. Normal  mood.        Diet Orders (From admission, onward)     Start     Ordered   12/07/23 1111  Diet Carb Modified Fluid consistency: Thin  Diet effective now       Question Answer Comment  Calorie Level Medium 1600-2000   Fluid consistency: Thin      12/07/23 1110            Objective: Vitals:   12/08/23 0850 12/08/23 1651 12/08/23 2005 12/09/23 0432  BP: (!) 151/68 (!) 151/68 (!) 133/93 (!) 140/84  Pulse: 88 88 93 85  Resp: 20  14 15   Temp:   98.1 F (36.7 C) 98.2 F (36.8 C)  TempSrc:   Oral Oral  SpO2:   92% 96%  Weight:      Height:        Intake/Output Summary (Last 24 hours) at 12/09/2023 0747 Last data filed at 12/09/2023 0554 Gross per 24 hour  Intake 1974.17 ml  Output 150 ml  Net 1824.17 ml   Filed Weights   12/06/23 0800 12/07/23 0848  Weight: 81.2 kg 81.2 kg    Scheduled Meds:  (feeding supplement) PROSource Plus  30 mL Oral BID AC   aspirin   81 mg Oral Daily   atorvastatin   80 mg Oral Daily   carvedilol   3.125 mg Oral BID WC   cholecalciferol   2,000 Units Oral Daily   gabapentin   300 mg Oral TID   heparin   5,000 Units Subcutaneous Q8H   insulin  aspart  0-5 Units Subcutaneous QHS   insulin  aspart  0-9 Units Subcutaneous TID WC   insulin  aspart  2 Units Subcutaneous TID WC   insulin  glargine-yfgn  5 Units Subcutaneous QHS   levothyroxine   75 mcg Oral Daily   multivitamin with minerals  1 tablet Oral Daily   pantoprazole  (PROTONIX ) IV  40 mg Intravenous Daily   Continuous Infusions:  sodium chloride  100 mL/hr (12/09/23 9371)   linezolid  (ZYVOX ) IV 600 mg (12/08/23 2210)    Nutritional status Signs/Symptoms: estimated needs Interventions: MVI, Glucerna shake, Prostat Body mass index is 28.04 kg/m.  Data Reviewed:   CBC: Recent Labs  Lab 12/06/23 0843 12/06/23 0844 12/07/23 0414 12/08/23 0411  WBC  --  9.3 8.0 8.4  NEUTROABS  --  6.6  --   --   HGB 8.5* 8.3* 7.4* 7.2*  HCT 25.0* 26.3* 23.5* 23.7*  MCV  --  91.3 91.8 93.3  PLT   --  431* 365 353   Basic Metabolic Panel: Recent Labs  Lab 12/06/23 0843 12/06/23 0844 12/07/23 0414 12/08/23 0411 12/09/23 0419  NA 144 141 141 140 142  K 4.1 4.0 4.2 4.5 4.2  CL 112* 109 110 110 113*  CO2  --  18* 21* 20* 21*  GLUCOSE 275* 273* 204* 197* 186*  BUN 42* 43* 43* 48* 40*  CREATININE 3.80* 3.38* 3.83* 4.98* 4.53*  CALCIUM   --  9.4 8.7* 8.2* 8.2*  MG  --   --   --  1.6*  --   PHOS  --   --   --  3.8  --    GFR: Estimated Creatinine Clearance: 14.3 mL/min (A) (by C-G formula based on SCr of 4.53 mg/dL (H)). Liver Function Tests: Recent Labs  Lab 12/06/23 0844  AST 19  ALT 12  ALKPHOS 148*  BILITOT 0.3  PROT 8.1  ALBUMIN 3.5   No results for input(s): LIPASE, AMYLASE in the last 168 hours. No results for input(s): AMMONIA in the last 168 hours. Coagulation Profile: No results for input(s): INR, PROTIME in the last 168 hours. Cardiac Enzymes: No results for input(s): CKTOTAL, CKMB, CKMBINDEX, TROPONINI in the last 168 hours. BNP (last 3 results) No results for input(s): PROBNP in the last 8760 hours. HbA1C: Recent Labs    12/06/23 0844  HGBA1C 8.5*   CBG: Recent Labs  Lab 12/07/23 2001 12/08/23 0720 12/08/23 1120 12/08/23 2002 12/09/23 0715  GLUCAP 177* 158* 163* 127* 168*   Lipid Profile: Recent Labs    12/06/23 0844  CHOL 210*  HDL 40*  LDLCALC 137*  TRIG 167*  CHOLHDL 5.3   Thyroid Function Tests: Recent Labs    12/06/23 0844  TSH 1.700   Anemia Panel: No results for input(s): VITAMINB12, FOLATE, FERRITIN, TIBC, IRON, RETICCTPCT in the last 72 hours. Sepsis Labs: No results for input(s): PROCALCITON, LATICACIDVEN in the last 168 hours.  Recent Results (from the past 240 hours)  Blood culture (routine x 2)     Status: None (Preliminary result)   Collection Time: 12/06/23  8:44 AM   Specimen: BLOOD  Result Value Ref Range Status   Specimen Description BLOOD BLOOD RIGHT ARM  Final    Special Requests   Final    BOTTLES DRAWN AEROBIC AND ANAEROBIC Blood Culture adequate volume   Culture   Final    NO GROWTH 2 DAYS Performed at [facility], [address], [place], [place] [address]    Report Status PENDING  Incomplete  Blood culture (routine x 2)     Status: None (Preliminary result)   Collection Time: 12/06/23  8:44 AM   Specimen: BLOOD  Result Value Ref Range Status   Specimen Description BLOOD BLOOD LEFT ARM  Final   Special Requests   Final    BOTTLES DRAWN AEROBIC AND ANAEROBIC Blood Culture adequate volume   Culture   Final    NO GROWTH 2 DAYS Performed at [facility], [address], [place], [place] [address]    Report Status PENDING  Incomplete  Surgical pcr screen     Status: None   Collection Time: 12/07/23  6:40 AM   Specimen: Nasal Mucosa; Nasal Swab  Result Value Ref Range Status   MRSA, PCR NEGATIVE NEGATIVE Final   Staphylococcus aureus NEGATIVE NEGATIVE Final    Comment: (NOTE) The Xpert SA Assay (FDA approved for NASAL specimens in patients 14 years of age and older), is one component of a comprehensive surveillance program. It is not intended to diagnose infection nor to guide or monitor treatment. Performed at [facility], [address], [place], [place] [address]          Radiology Studies: US  RENAL Result Date: 12/08/2023 CLINICAL DATA:  Acute kidney injury EXAM: RENAL / URINARY TRACT ULTRASOUND COMPLETE COMPARISON:  CT abdomen and pelvis dated 11/14/2016 FINDINGS: Right Kidney: Length = 9.7 cm Diffusely increased cortical echogenicity with preserved corticomedullary differentiation which can be seen with medical renal disease. No urinary tract dilation or shadowing calculi. The ureter is not seen. Left Kidney: Length = 12.2 cm Normal parenchymal echogenicity with preserved corticomedullary differentiation. Lower pole simple cyst measures 1.8 cm. No urinary tract dilation or shadowing calculi. The ureter is not seen. Bladder: Appears  normal for degree of bladder distention. Other: None. IMPRESSION: 1. No urinary tract dilation or shadowing calculi. 2. Asymmetrically smaller right kidney with diffusely  increased cortical echogenicity, which can be seen with medical renal disease. Electronically Signed   By: Limin  Xu M.D.   On: 12/08/2023 16:18           LOS: 3 days   Time spent= 39 mins    Deliliah Room, MD [facility]  If 7PM-7AM, please contact night-coverage  12/09/2023, 7:47 AM

## 2023-12-09 NOTE — Plan of Care (Signed)
   Problem: Education: Goal: Knowledge of General Education information will improve Description: Including pain rating scale, medication(s)/side effects and non-pharmacologic comfort measures Outcome: Progressing   Problem: Clinical Measurements: Goal: Will remain free from infection Outcome: Progressing   Problem: Coping: Goal: Level of anxiety will decrease Outcome: Progressing

## 2023-12-09 NOTE — Progress Notes (Signed)
 Dressing change per orders performed to right foot, 2nd toe amputation. No s/s of infection noted. No drainage or bleeding, minimal swelling and pain. Strong pulse palpated. Skin to right foot warm and dry.  Six sutures noted to surgical sight with well approximated surgical wound.  Pt tolerated wound care well with no complaints. Comfort measures provided, surgical boot replaced. Personal belongings and call bell left within reach,  bed locked and low.

## 2023-12-09 NOTE — Progress Notes (Signed)
 2 Days Post-Op  Subjective: Has some moderate incisional pain.  Objective: Vital signs in last 24 hours: Temp:  [98.1 F (36.7 C)-98.2 F (36.8 C)] 98.2 F (36.8 C) (12/06 0432) Pulse Rate:  [85-93] 85 (12/06 0432) Resp:  [14-20] 15 (12/06 0432) BP: (133-151)/(68-93) 140/84 (12/06 0432) SpO2:  [92 %-96 %] 96 % (12/06 0432) Last BM Date : 12/08/23  Intake/Output from previous day: 12/05 0701 - 12/06 0700 In: 1974.2 [P.O.:720; I.V.:935; IV Piggyback:319.2] Out: 150  Intake/Output this shift: No intake/output data recorded.  General appearance: alert, cooperative, and no distress Extremities: Right foot incision healing well.  No purulent drainage noted.  No significant erythema noted.  Lab Results:  Recent Labs    12/07/23 0414 12/08/23 0411  WBC 8.0 8.4  HGB 7.4* 7.2*  HCT 23.5* 23.7*  PLT 365 353   BMET Recent Labs    12/08/23 0411 12/09/23 0419  NA 140 142  K 4.5 4.2  CL 110 113*  CO2 20* 21*  GLUCOSE 197* 186*  BUN 48* 40*  CREATININE 4.98* 4.53*  CALCIUM  8.2* 8.2*   PT/INR No results for input(s): LABPROT, INR in the last 72 hours.  Studies/Results: US  RENAL Result Date: 12/08/2023 CLINICAL DATA:  Acute kidney injury EXAM: RENAL / URINARY TRACT ULTRASOUND COMPLETE COMPARISON:  CT abdomen and pelvis dated 11/14/2016 FINDINGS: Right Kidney: Length = 9.7 cm Diffusely increased cortical echogenicity with preserved corticomedullary differentiation which can be seen with medical renal disease. No urinary tract dilation or shadowing calculi. The ureter is not seen. Left Kidney: Length = 12.2 cm Normal parenchymal echogenicity with preserved corticomedullary differentiation. Lower pole simple cyst measures 1.8 cm. No urinary tract dilation or shadowing calculi. The ureter is not seen. Bladder: Appears normal for degree of bladder distention. Other: None. IMPRESSION: 1. No urinary tract dilation or shadowing calculi. 2. Asymmetrically smaller right kidney with  diffusely increased cortical echogenicity, which can be seen with medical renal disease. Electronically Signed   By: Limin  Xu M.D.   On: 12/08/2023 16:18    Anti-infectives: Anti-infectives (From admission, onward)    Start     Dose/Rate Route Frequency Ordered Stop   12/06/23 1300  metroNIDAZOLE  (FLAGYL ) IVPB 500 mg  Status:  Discontinued        500 mg 100 mL/hr over 60 Minutes Intravenous Every 12 hours 12/06/23 1219 12/08/23 0813   12/06/23 1230  linezolid  (ZYVOX ) IVPB 600 mg        600 mg 300 mL/hr over 60 Minutes Intravenous Every 12 hours 12/06/23 1217     12/06/23 1230  ceFEPIme  (MAXIPIME ) 2 g in sodium chloride  0.9 % 100 mL IVPB  Status:  Discontinued        2 g 200 mL/hr over 30 Minutes Intravenous Every 24 hours 12/06/23 1218 12/08/23 0813       Assessment/Plan: s/p Procedure(s): AMPUTATION, TOE Impression: Incision healing well postoperative day 2.  May clean wound with soap and water  every shift.  If patient is discharged, will follow-up in my office on 12/14/2023.  Would continue antibiotic course for a total of 10 days.  This is day 3.  LOS: 3 days    Oneil Budge 12/09/2023

## 2023-12-10 LAB — BASIC METABOLIC PANEL WITH GFR
Anion gap: 7 (ref 5–15)
BUN: 32 mg/dL — ABNORMAL HIGH (ref 8–23)
CO2: 23 mmol/L (ref 22–32)
Calcium: 8.3 mg/dL — ABNORMAL LOW (ref 8.9–10.3)
Chloride: 110 mmol/L (ref 98–111)
Creatinine, Ser: 4.35 mg/dL — ABNORMAL HIGH (ref 0.44–1.00)
GFR, Estimated: 11 mL/min — ABNORMAL LOW (ref >60)
Glucose, Bld: 207 mg/dL — ABNORMAL HIGH (ref 70–99)
Potassium: 4.3 mmol/L (ref 3.5–5.1)
Sodium: 140 mmol/L (ref 135–145)

## 2023-12-10 LAB — GLUCOSE, CAPILLARY
Glucose-Capillary: 173 mg/dL — ABNORMAL HIGH (ref 70–99)
Glucose-Capillary: 279 mg/dL — ABNORMAL HIGH (ref 70–99)
Glucose-Capillary: 177 mg/dL — ABNORMAL HIGH (ref 70–99)
Glucose-Capillary: 184 mg/dL — ABNORMAL HIGH (ref 70–99)

## 2023-12-10 LAB — PARATHYROID HORMONE, INTACT (NO CA): PTH: 107 pg/mL — ABNORMAL HIGH (ref 15–65)

## 2023-12-10 MED ORDER — GABAPENTIN 300 MG PO CAPS
300.0000 mg | ORAL_CAPSULE | Freq: Every day | ORAL | Status: DC
  Administered 2023-12-11: 300 mg via ORAL
  Filled 2023-12-10: qty 1

## 2023-12-10 NOTE — Progress Notes (Signed)
 [facility] PROGRESS NOTE  Brianna [name] (DOB: 1960-11-02) FMW:[id number] PCP: Kristine Corean Deed, NP  Brief Narrative: Brianna [name] is a 63 y.o. female with a history of IDT2DM, stage IV CKD awaiting renal transplant, HTN, HLD who presented to the ED 12/3 with an infection in her right 2nd toe. Imaging suggested osteomyelitis for which she was given antibiotics and had amputation performed 12/4 by Dr. Mavis. She suffered post-operative AKI which is being followed by nephrology and showing some improvement with IV fluids.   Subjective: Urine output is picking up, does not feel ready to go home as she's still not eating or drinking enough, having some nausea and vomiting.   Objective: BP (!) 151/65 (BP Location: Right Arm)   Pulse 93   Temp 98.6 F (37 C) (Oral)   Resp 17   Ht 5' 7 (1.702 m)   Wt 81.2 kg   SpO2 97%   BMI 28.04 kg/m   Gen: No distress Pulm: Clear, nonlabored  CV: RRR, no MRG, no edema GI: Soft, NT, ND, +BS  Neuro: Alert and oriented. No new focal deficits. Ext: Warm, dry. Right foot in boot, dressing on 2nd toe amputation site with some recent discharge, but nothing active.    Assessment & Plan: Dry gangrene and osteomyelitis of right second toe: Diabetes with neuropathy, HTN, and distal-predominant PAD contributing to poor healing risk. ABI's suggestive of mild PAD at 0.92 (R) and 0.89 (L), with monophasic waveforms suggestive of microvascular disease. Not amenable to revascularization.  - s/p right 2nd toe amputation 12/4: Continue wound care per surgery (soap/water  qShift). Will follow up with Dr. Mavis after discharge on 12/11.  - Continue antibiotics with linezolid  x10 days from surgery (12/4 - 12/13).   AKI on stage IV CKD: Cr up to 4.98 postoperatively from baseline of 3.3-3.4. CrCl still < 34ml/min. Renal U/S without hydro, shows increased cortical echogenicity and relative atrophy of right kidney - Appreciate nephrology  recommendations.  - Avoid hypotension, nephrotoxins, and hypovolemia. Continue IVF while PO intake inadequate.  - Recheck metabolic panel 12/8.   Diabetic neuropathy: Pt's CrCl suggests maximum gabapentin  dose should be 300mg /day. Relative toxicity could be contributing to nausea and vomiting.  - Decrease to guideline dosing 300mg /day.  - Continue pain control as ordered otherwise.   PAD:  - Continue aspirin , statin, vascular surgery follow up as outpatient.   IDT2DM:  - Continue basal 5u qHS, mealtime 2u TIDWC + renal SSI.  HTN with HTN urgency:  - Improved significantly, now on coreg  3.125mg  BID only, continue prn hydralazine , avoid hypotension.   Hypothyroidism: TSH 1.7.  - Continue synthroid .   GERD:  - Continue PPI  Brianna KATHEE Come, MD [facility] [email or web address] 12/10/2023, 3:01 PM

## 2023-12-10 NOTE — Progress Notes (Signed)
 3 Days Post-Op  Subjective: No new complaints.  Objective: Vital signs in last 24 hours: Temp:  [98.1 F (36.7 C)-98.3 F (36.8 C)] 98.3 F (36.8 C) (12/07 0415) Pulse Rate:  [90-95] 90 (12/07 0415) Resp:  [15] 15 (12/07 0415) BP: (152-182)/(78-107) 152/92 (12/07 0415) SpO2:  [96 %-100 %] 96 % (12/07 0415) Last BM Date : 12/08/23  Intake/Output from previous day: 12/06 0701 - 12/07 0700 In: 480 [P.O.:480] Out: -  Intake/Output this shift: Total I/O In: 240 [P.O.:240] Out: 400 [Urine:400]  General appearance: alert, cooperative, and no distress Extremities: Incision with slight separation in the midportion.  No purulent drainage present.  Loose sutures were removed.  Lab Results:  Recent Labs    12/08/23 0411  WBC 8.4  HGB 7.2*  HCT 23.7*  PLT 353   BMET Recent Labs    12/09/23 0419 12/10/23 0358  NA 142 140  K 4.2 4.3  CL 113* 110  CO2 21* 23  GLUCOSE 186* 207*  BUN 40* 32*  CREATININE 4.53* 4.35*  CALCIUM  8.2* 8.3*   PT/INR No results for input(s): LABPROT, INR in the last 72 hours.  Studies/Results: US  RENAL Result Date: 12/08/2023 CLINICAL DATA:  Acute kidney injury EXAM: RENAL / URINARY TRACT ULTRASOUND COMPLETE COMPARISON:  CT abdomen and pelvis dated 11/14/2016 FINDINGS: Right Kidney: Length = 9.7 cm Diffusely increased cortical echogenicity with preserved corticomedullary differentiation which can be seen with medical renal disease. No urinary tract dilation or shadowing calculi. The ureter is not seen. Left Kidney: Length = 12.2 cm Normal parenchymal echogenicity with preserved corticomedullary differentiation. Lower pole simple cyst measures 1.8 cm. No urinary tract dilation or shadowing calculi. The ureter is not seen. Bladder: Appears normal for degree of bladder distention. Other: None. IMPRESSION: 1. No urinary tract dilation or shadowing calculi. 2. Asymmetrically smaller right kidney with diffusely increased cortical echogenicity, which can  be seen with medical renal disease. Electronically Signed   By: Limin  Xu M.D.   On: 12/08/2023 16:18    Anti-infectives: Anti-infectives (From admission, onward)    Start     Dose/Rate Route Frequency Ordered Stop   12/06/23 1300  metroNIDAZOLE  (FLAGYL ) IVPB 500 mg  Status:  Discontinued        500 mg 100 mL/hr over 60 Minutes Intravenous Every 12 hours 12/06/23 1219 12/08/23 0813   12/06/23 1230  linezolid  (ZYVOX ) IVPB 600 mg        600 mg 300 mL/hr over 60 Minutes Intravenous Every 12 hours 12/06/23 1217     12/06/23 1230  ceFEPIme  (MAXIPIME ) 2 g in sodium chloride  0.9 % 100 mL IVPB  Status:  Discontinued        2 g 200 mL/hr over 30 Minutes Intravenous Every 24 hours 12/06/23 1218 12/08/23 0813       Assessment/Plan: s/p Procedure(s): AMPUTATION, TOE Impression: Postoperative day 3.  Some slight wound separation noted, not unexpected.  Wound should still heal well by secondary intention.  No evidence of infection present.  Continue current wound care.  LOS: 4 days    Oneil Budge 12/10/2023

## 2023-12-10 NOTE — Plan of Care (Signed)
   Problem: Education: Goal: Knowledge of General Education information will improve Description: Including pain rating scale, medication(s)/side effects and non-pharmacologic comfort measures Outcome: Progressing   Problem: Activity: Goal: Risk for activity intolerance will decrease Outcome: Progressing   Problem: Nutrition: Goal: Adequate nutrition will be maintained Outcome: Progressing

## 2023-12-11 ENCOUNTER — Other Ambulatory Visit (HOSPITAL_COMMUNITY): Payer: Self-pay

## 2023-12-11 ENCOUNTER — Telehealth (HOSPITAL_COMMUNITY): Payer: Self-pay

## 2023-12-11 LAB — RENAL FUNCTION PANEL
Albumin: 2.7 g/dL — ABNORMAL LOW (ref 3.5–5.0)
Anion gap: 6 (ref 5–15)
BUN: 30 mg/dL — ABNORMAL HIGH (ref 8–23)
CO2: 22 mmol/L (ref 22–32)
Calcium: 8.3 mg/dL — ABNORMAL LOW (ref 8.9–10.3)
Chloride: 109 mmol/L (ref 98–111)
Creatinine, Ser: 3.9 mg/dL — ABNORMAL HIGH (ref 0.44–1.00)
GFR, Estimated: 12 mL/min — ABNORMAL LOW (ref >60)
Glucose, Bld: 214 mg/dL — ABNORMAL HIGH (ref 70–99)
Phosphorus: 3.3 mg/dL (ref 2.5–4.6)
Potassium: 4.6 mmol/L (ref 3.5–5.1)
Sodium: 137 mmol/L (ref 135–145)

## 2023-12-11 LAB — CULTURE, BLOOD (ROUTINE X 2)
Culture: NO GROWTH
Culture: NO GROWTH
Special Requests: ADEQUATE
Special Requests: ADEQUATE

## 2023-12-11 LAB — GLUCOSE, CAPILLARY
Glucose-Capillary: 137 mg/dL — ABNORMAL HIGH (ref 70–99)
Glucose-Capillary: 177 mg/dL — ABNORMAL HIGH (ref 70–99)

## 2023-12-11 LAB — SURGICAL PATHOLOGY

## 2023-12-11 LAB — MAGNESIUM: Magnesium: 1.5 mg/dL — ABNORMAL LOW (ref 1.7–2.4)

## 2023-12-11 MED ORDER — MAGNESIUM SULFATE 2 GM/50ML IV SOLN
2.0000 g | Freq: Once | INTRAVENOUS | Status: AC
  Administered 2023-12-11: 2 g via INTRAVENOUS
  Filled 2023-12-11: qty 50

## 2023-12-11 MED ORDER — LINEZOLID 600 MG PO TABS: 600.0000 mg | ORAL_TABLET | Freq: Two times a day (BID) | ORAL | 0 refills | Status: AC

## 2023-12-11 MED ORDER — HYDROCODONE-ACETAMINOPHEN 5-325 MG PO TABS: 1.0000 | ORAL_TABLET | ORAL | 0 refills | Status: AC | PRN

## 2023-12-11 NOTE — Plan of Care (Signed)

## 2023-12-11 NOTE — TOC Progression Note (Signed)
 Transition of Care [facility]) - Progression Note    Patient Details  Name: Brianna [name] MRN: [id number] Date of Birth: [birth year]  Transition of Care [facility]) CM/SW Contact  Mcarthur Saddie Kim, [place] Phone Number: 12/11/2023, 8:23 AM  Clinical Narrative: PT recommending HHPT. Pt agreeable. Will ask [facility] to add HHPT to [facility]. MD to add HHPT to order.       Expected Discharge Plan: Home w Home Health Services Barriers to Discharge: Continued Medical Work up               Expected Discharge Plan and Services In-house Referral: Clinical Social Work Discharge Planning Services: CM Consult Post Acute Care Choice: Home Health Living arrangements for the past 2 months: Single Family Home                           HH Arranged: RN HH Agency: [facility] Date HH Agency Contacted: 12/08/23   Representative spoke with at [facility] Agency: Eleanor   Social Drivers of Health (SDOH) Interventions SDOH Screenings   Food Insecurity: No Food Insecurity (12/06/2023)  Housing: Low Risk  (12/06/2023)  Transportation Needs: No Transportation Needs (12/06/2023)  Utilities: Not At Risk (12/06/2023)  Depression (PHQ2-9): Low Risk  (10/07/2019)  Tobacco Use: Low Risk  (12/07/2023)    Readmission Risk Interventions    12/07/2023    1:24 PM  Readmission Risk Prevention Plan  Transportation Screening Complete  PCP or Specialist Appt within 5-7 Days Complete  Home Care Screening Complete  Medication Review (RN CM) Complete

## 2023-12-11 NOTE — Progress Notes (Signed)
 [facility] Progress Note    Assessment/ Plan:   AKI on CKD4 -followed by [facility] as an outpatient in [place].  AKI likely related to toxic/ischemic ATN from infection and relative hypotension.  Peak creatinine 4.98, down to 3.9 today.  Nonoliguric.  No longer having any issues with nausea/low appetite.  No indications for renal replacement therapy just yet - Continue with supportive care with IV fluids, encouraged p.o. intake - Following with [facility] transplant and son is a potential donor who has not been worked up yet - She would like to transition her care from [facility] nephrology over to us , card provided.  I will discuss with the staff at my office to get her an appointment by next Monday hopefully -Avoid nephrotoxic medications including NSAIDs and iodinated intravenous contrast exposure unless the latter is absolutely indicated.  Preferred narcotic agents for pain control are hydromorphone, fentanyl , and methadone. Morphine  should not be used. Avoid Baclofen and avoid oral sodium phosphate and magnesium  citrate based laxatives / bowel preps. Continue strict Input and Output monitoring. 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 Will sign off. Discussed with primary service. Will arrange for outpatient follow up with us  in 1 week.  Subjective:   Patient seen and examined bedside. She reports that her nausea and loss of appetite improved/resolved as of a couple of days ago. To be discharged today. No complaints otherwise. She wishes to  transfer her care over to us , has been unsatisfied with her primary nephrologist.   Objective:   BP (!) 154/98 (BP Location: Right Arm)   Pulse 85   Temp 98.4 F (36.9 C) (Oral)   Resp 17   Ht 5' 7 (1.702 m)   Wt 81.2 kg   SpO2 96%   BMI 28.04 kg/m   Intake/Output Summary (Last 24 hours) at 12/11/2023 0900 Last data filed at 12/11/2023 9176 Gross per 24 hour  Intake 960 ml  Output 1200 ml  Net -240 ml   Weight change:   Physical Exam: Gen: NAD, nontoxic appearing CVS: RRR Resp: normal wob, unlabored, speaking in full sentences Abd: ND Ext: trace edema RLE, dressing over right foot Neuro: awake, alert, no myoclonic jerks observed  Imaging: No results found.  Labs: BMET Recent Labs  Lab 12/06/23 0843 12/06/23 0844 12/07/23 0414 12/08/23 0411 12/09/23 0419 12/10/23 0358 12/11/23 0413  NA 144 141 141 140 142 140 137  K 4.1 4.0 4.2 4.5 4.2 4.3 4.6  CL 112* 109 110 110 113* 110 109  CO2  --  18* 21* 20* 21* 23 22  GLUCOSE 275* 273* 204* 197* 186* 207* 214*  BUN 42* 43* 43* 48* 40* 32* 30*  CREATININE 3.80* 3.38* 3.83* 4.98* 4.53* 4.35* 3.90*  CALCIUM   --  9.4 8.7* 8.2* 8.2* 8.3* 8.3*  PHOS  --   --   --  3.8  --   --  3.3   CBC Recent Labs  Lab 12/06/23 0843 12/06/23 0844 12/07/23 0414 12/08/23 0411  WBC  --  9.3 8.0 8.4  NEUTROABS  --  6.6  --   --  HGB 8.5* 8.3* 7.4* 7.2*  HCT 25.0* 26.3* 23.5* 23.7*  MCV  --  91.3 91.8 93.3  PLT  --  431* 365 353    Medications:     (feeding supplement) PROSource Plus  30 mL Oral BID AC   aspirin   81 mg Oral Daily   atorvastatin   80 mg Oral Daily   carvedilol   3.125 mg Oral BID WC   cholecalciferol   2,000 Units Oral Daily   gabapentin   300 mg Oral Daily   heparin   5,000 Units Subcutaneous Q8H   insulin  aspart  0-5 Units Subcutaneous QHS   insulin  aspart  0-9 Units Subcutaneous TID WC   insulin  aspart  2 Units Subcutaneous TID WC   insulin  glargine-yfgn  5 Units Subcutaneous QHS   levothyroxine   75 mcg  Oral Daily   multivitamin with minerals  1 tablet Oral Daily   pantoprazole  (PROTONIX ) IV  40 mg Intravenous Daily      Ephriam Stank, MD [facility] 12/11/2023, 9:00 AM

## 2023-12-11 NOTE — TOC Progression Note (Signed)
 Transition of Care [facility]) - Progression Note    Patient Details  Name: Brianna [name] MRN: [id number] Date of Birth: 10-24-1960  Transition of Care [facility]) CM/SW Contact  Mcarthur Saddie Kim, [place] Phone Number: 12/11/2023, 1:26 PM  Clinical Narrative:  LCSW received call that pt is not in network with [facility]. LCSW asked them to recheck because she was confirmed in network last week. [facility] called back and pt is in network, but has a $1500 deductible for home health. LCSW notified pt who states she cannot afford this. MD updated. Pt has appointment with surgeon on Thursday. Per Dr. Mavis, his office will call pt to review wound care until appointment. He asked for RN to also call pt. LCSW requested RN call pt to go over wound care. Pt aware.      Expected Discharge Plan: Home w Home Health Services Barriers to Discharge: Barriers Resolved               Expected Discharge Plan and Services In-house Referral: Clinical Social Work Discharge Planning Services: CM Consult Post Acute Care Choice: Home Health Living arrangements for the past 2 months: Single Family Home Expected Discharge Date: 12/11/23                         HH Arranged: RN, PT HH Agency: [facility] Date HH Agency Contacted: 12/08/23   Representative spoke with at [facility] Agency: Eleanor   Social Drivers of Health (SDOH) Interventions SDOH Screenings   Food Insecurity: No Food Insecurity (12/06/2023)  Housing: Low Risk  (12/06/2023)  Transportation Needs: No Transportation Needs (12/06/2023)  Utilities: Not At Risk (12/06/2023)  Depression (PHQ2-9): Low Risk  (10/07/2019)  Tobacco Use: Low Risk  (12/07/2023)    Readmission Risk Interventions    12/07/2023    1:24 PM  Readmission Risk Prevention Plan  Transportation Screening Complete  PCP or Specialist Appt within 5-7 Days Complete  Home Care Screening Complete  Medication Review (RN CM) Complete

## 2023-12-11 NOTE — Progress Notes (Signed)
 4 Days Post-Op  Subjective: No complaints.  Patient states she is going home.  Objective: Vital signs in last 24 hours: Temp:  [98.4 F (36.9 C)-98.9 F (37.2 C)] 98.4 F (36.9 C) (12/08 0430) Pulse Rate:  [85-96] 85 (12/08 0430) Resp:  [15-17] 17 (12/08 0430) BP: (142-175)/(65-103) 154/98 (12/08 0430) SpO2:  [96 %-100 %] 96 % (12/08 0430) Last BM Date : 12/08/23  Intake/Output from previous day: 12/07 0701 - 12/08 0700 In: 960 [P.O.:960] Out: 1900 [Urine:1900] Intake/Output this shift: Total I/O In: 240 [P.O.:240] Out: -   General appearance: alert, cooperative, and no distress Extremities: Right second toe amputation site healing well by secondary intention.  Some sutures are still in place.  No purulent drainage noted.  Lab Results:  No results for input(s): WBC, HGB, HCT, PLT in the last 72 hours. BMET Recent Labs    12/10/23 0358 12/11/23 0413  NA 140 137  K 4.3 4.6  CL 110 109  CO2 23 22  GLUCOSE 207* 214*  BUN 32* 30*  CREATININE 4.35* 3.90*  CALCIUM  8.3* 8.3*   PT/INR No results for input(s): LABPROT, INR in the last 72 hours.  Studies/Results: No results found.  Anti-infectives: Anti-infectives (From admission, onward)    Start     Dose/Rate Route Frequency Ordered Stop   12/11/23 0000  linezolid  (ZYVOX ) 600 MG tablet        600 mg Oral 2 times daily 12/11/23 0836 12/17/23 2359   12/06/23 1300  metroNIDAZOLE  (FLAGYL ) IVPB 500 mg  Status:  Discontinued        500 mg 100 mL/hr over 60 Minutes Intravenous Every 12 hours 12/06/23 1219 12/08/23 0813   12/06/23 1230  linezolid  (ZYVOX ) IVPB 600 mg        600 mg 300 mL/hr over 60 Minutes Intravenous Every 12 hours 12/06/23 1217 12/16/23 0959   12/06/23 1230  ceFEPIme  (MAXIPIME ) 2 g in sodium chloride  0.9 % 100 mL IVPB  Status:  Discontinued        2 g 200 mL/hr over 30 Minutes Intravenous Every 24 hours 12/06/23 1218 12/08/23 0813       Assessment/Plan: s/p Procedure(s): AMPUTATION,  TOE Impression: Okay for discharge from surgery standpoint.  Would send home on p.o. antibiotic (?  Zyvox ) for a total of 10 days of coverage.  Will be following up in my office on 12/14/2023.  LOS: 5 days    Oneil Budge 12/11/2023

## 2023-12-11 NOTE — Discharge Summary (Signed)
 Physician Discharge Summary   Patient: Brianna [name] MRN: [id number] DOB: 1960/06/20  Admit date:     12/06/2023  Discharge date: 12/11/23  Discharge Physician: Bernardino KATHEE Come   PCP: Kristine Corean Deed, NP   Recommendations at discharge:  Follow up with Dr. Mavis 12/11. Follow up with nephrology after discharge, to be arranged.  Follow up with PCP in 1-2 weeks, suggest recheck BMP  Discharge Diagnoses: Principal Problem:   Osteomyelitis of toe of right foot (HCC) Active Problems:   Dry gangrene (HCC)   Osteomyelitis of second toe of right foot Brianna [name])  [name] Course: [name] is a 63 y.o. female with a history of IDT2DM, stage IV CKD awaiting renal transplant, HTN, HLD who presented to the ED 12/3 with an infection in her right 2nd toe. Imaging suggested osteomyelitis for which she was given antibiotics and had amputation performed 12/4 by Dr. Mavis. She suffered post-operative AKI which is being followed by nephrology and showing some improvement with IV fluids.   Assessment and Plan: Dry gangrene and osteomyelitis of right second toe: Diabetes with neuropathy, HTN, and distal-predominant PAD contributing to poor healing risk. ABI's suggestive of mild PAD at 0.92 (R) and 0.89 (L), with monophasic waveforms suggestive of microvascular disease. Not amenable to revascularization.  - s/p right 2nd toe amputation 12/4: Continue wound care per surgery (soap/water  qShift). Will follow up with Dr. Mavis after discharge on 12/11.  - Continue antibiotics with linezolid  x10 days from surgery (12/4 - 12/13).    AKI on stage IV CKD: Cr up to 4.98 postoperatively from baseline of 3.3-3.4. Renal U/S without hydro, shows increased cortical echogenicity and relative atrophy of right kidney - Appreciate nephrology recommendations. Cr down to 3.9 without IVF.  - Avoid hypotension, nephrotoxins, and hypovolemia.     Diabetic neuropathy: Pt's CrCl suggests maximum  gabapentin  dose should be 300mg /day. Relative toxicity could be contributing to nausea and vomiting.  - Decreased to guideline dosing 300mg /day while inpatient, suggested she follow up with prescriber to continue monitor CrCl and dosing appropriately.  - Continue pain control as ordered otherwise. PDMP reviewed, hydrocodone  5/325mg  #20 tablets prescribed at discharge.    PAD:  - Continue aspirin , statin, vascular surgery follow up as outpatient.    IDT2DM:  - Continue home Tx   HTN with HTN urgency:  - Improved significantly    Hypothyroidism: TSH 1.7.  - Continue synthroid .    GERD:  - Continue PPI  Consultants: General surgery, nephrology Procedures performed:   12/07/23 AMPUTATION, TOE Mavis Anes, MD  Disposition: Home Diet recommendation:  Cardiac and Carb modified diet DISCHARGE MEDICATION: Allergies as of 12/11/2023       Reactions   Other Hives, Shortness Of Breath   seafood   Bacitracin    Iodine Hives   shellfish   Tramadol     Neosporin [neomycin-bacitracin Zn-polymyx] Rash        Medication List     TAKE these medications    acetaminophen  500 MG tablet Commonly known as: TYLENOL  Take 500 mg by mouth every 6 (six) hours as needed for moderate pain.   amitriptyline  50 MG tablet Commonly known as: ELAVIL  TAKE 1 TABLET BY MOUTH AT BEDTIME   ammonium lactate 12 % cream Commonly known as: AMLACTIN APPLY CREAM TOPICALLY TO AFFECTED AREA ONCE DAILY   ascorbic acid 500 MG tablet Commonly known as: VITAMIN C Take 500 mg by mouth daily.   aspirin  81 MG chewable tablet Chew 81 mg by mouth daily.  atorvastatin  80 MG tablet Commonly known as: Lipitor Take 1 tablet (80 mg total) by mouth daily.   carvedilol  3.125 MG tablet Commonly known as: Coreg  Take 1 tablet (3.125 mg total) by mouth 2 (two) times daily with a meal.   cholecalciferol  25 MCG (1000 UNIT) tablet Commonly known as: VITAMIN D3 Take 2 tablets (2,000 Units total) by mouth daily.    desonide 0.05 % cream Commonly known as: DESOWEN Apply 1 Application topically 3 (three) times daily. For 1 days   gabapentin  300 MG capsule Commonly known as: NEURONTIN  Take 300 mg by mouth 3 (three) times daily.   HYDROcodone -acetaminophen  5-325 MG tablet Commonly known as: NORCO/VICODIN Take 1-2 tablets by mouth every 4 (four) hours as needed for up to 5 days for moderate pain (pain score 4-6) or severe pain (pain score 7-10).   hydrOXYzine 25 MG tablet Commonly known as: ATARAX Take 1 tablet by mouth daily as needed.   levothyroxine  75 MCG tablet Commonly known as: SYNTHROID  Take 75 mcg by mouth daily.   linezolid  600 MG tablet Commonly known as: ZYVOX  Take 1 tablet (600 mg total) by mouth 2 (two) times daily for 6 days.   NOVOLOG  [place] Inject 10-16 Units into the skin See admin instructions.   omeprazole  20 MG tablet Commonly known as: PRILOSEC OTC Take 20 mg by mouth daily. For 14 days   ondansetron  4 MG disintegrating tablet Commonly known as: ZOFRAN -ODT DISSOLVE 1 TABLET IN MOUTH EVERY 8 HOURS AS NEEDED FOR NAUSEA.   OneTouch Verio test strip Generic drug: glucose blood Use as instructed   glucose blood test strip One touch verio glucose test strip.  Use as instructed   OneTouch Verio w/Device Kit 1 each by Does not apply route as needed.   pantoprazole  40 MG tablet Commonly known as: PROTONIX  40 mg by oral route.   polyethylene glycol 17 g packet Commonly known as: MIRALAX / GLYCOLAX Take 17 g by mouth daily as needed for mild constipation.   pregabalin 75 MG capsule Commonly known as: LYRICA Take 75 mg by mouth 2 (two) times daily.   rosuvastatin  40 MG tablet Commonly known as: CRESTOR  Take 1 tablet by mouth daily.   Santyl 250 UNIT/GM ointment Generic drug: collagenase   Soliqua 100-33 UNT-MCG/ML Sopn Generic drug: Insulin  Glargine-Lixisenatide INJECT 35-45 UNITS SUBCUTANEOUSLY ONCE DAILY TITRATING DOSE   SSD 1 % cream Generic drug:  silver sulfADIAZINE   Trelegy Ellipta 100-62.5-25 MCG/ACT Aepb Generic drug: Fluticasone-Umeclidin-Vilant Inhale 1 puff into the lungs daily.   triamcinolone cream 0.1 % Commonly known as: KENALOG APPLY CREAM EXTERNALLY TO AFFECTED AREA TWICE A WEEK   Zinc 100 MG Tabs Take 1 tablet by mouth daily.               Discharge Care Instructions  (From admission, onward)           Start     Ordered   12/11/23 0000  Discharge wound care:       Comments: Twice daily: Wash right foot with soap and water .  Cover with Cling.   12/11/23 0836            Follow-up Information     [facility] Follow up.   Why: Agency will call to set up first home therapy visit. Contact information: [address] [place] [address] [phone]         Mavis Anes, MD. Schedule an appointment as soon as possible for a visit on 12/14/2023.  Specialty: General Surgery Contact information: [address] [place] [address] [phone]         Kristine Corean Deed, NP Follow up.   Specialty: Nurse Practitioner Contact information: [address] [place] [address] [phone]                Discharge Exam: Fredricka Weights   12/06/23 0800 12/07/23 0848  Weight: 81.2 kg 81.2 kg  BP (!) 154/98 (BP Location: Right Arm)   Pulse 85   Temp 98.4 F (36.9 C) (Oral)   Resp 17   Ht 5' 7 (1.702 m)   Wt 81.2 kg   SpO2 96%   BMI 28.04 kg/m   Well-appearing female in no distress Clear, nonlabored RRR, no MRG, no significant edema Right second toe amputation site healing well by secondary intention. Some sutures are still in place. No purulent drainage noted.   Condition at discharge: stable  The results of significant diagnostics from this hospitalization (including imaging, microbiology, ancillary and laboratory) are listed below for reference.   Imaging Studies: US  RENAL Result Date: 12/08/2023 CLINICAL DATA:  Acute  kidney injury EXAM: RENAL / URINARY TRACT ULTRASOUND COMPLETE COMPARISON:  CT abdomen and pelvis dated 11/14/2016 FINDINGS: Right Kidney: Length = 9.7 cm Diffusely increased cortical echogenicity with preserved corticomedullary differentiation which can be seen with medical renal disease. No urinary tract dilation or shadowing calculi. The ureter is not seen. Left Kidney: Length = 12.2 cm Normal parenchymal echogenicity with preserved corticomedullary differentiation. Lower pole simple cyst measures 1.8 cm. No urinary tract dilation or shadowing calculi. The ureter is not seen. Bladder: Appears normal for degree of bladder distention. Other: None. IMPRESSION: 1. No urinary tract dilation or shadowing calculi. 2. Asymmetrically smaller right kidney with diffusely increased cortical echogenicity, which can be seen with medical renal disease. Electronically Signed   By: Limin  Xu M.D.   On: 12/08/2023 16:18   US  ARTERIAL ABI (SCREENING LOWER EXTREMITY) Result Date: 12/06/2023 EXAM: VASCULAR SCREENING 12/06/2023 12:53:29 PM CLINICAL HISTORY: [id number] Gangrene of toe of right foot (HCC) [id number] Gangrene of toe of right foot (HCC) [id number] TECHNIQUE: ultrasound   ABIs were  acquired. COMPARISON: None available. FINDINGS: ANKLE BRACHIAL INDEX: Right: 0.92 Left: 0.89 Monophasic arterial waveforms are recorded distally in both lower extremities. IMPRESSION: 1. Left ABI 0.89 and right ABI 0.92, consistent with mild peripheral arterial disease in both lower extremities. 2. Monophasic distal arterial waveforms in both lower extremities, suggesting distal arterial insufficiency. Electronically signed by: Katheleen Faes MD 12/06/2023 01:58 PM EST RP Workstation: [id number]   DG Foot Complete Right Result Date: 12/06/2023 CLINICAL DATA:  Right second toe infection. EXAM: RIGHT FOOT COMPLETE - 3+ VIEW COMPARISON:  None Available. FINDINGS: There is no evidence of fracture or dislocation. Subtle loss of cortical integrity along  the distal aspect of the second distal phalanx is suspicious for early osteomyelitis. No soft tissue gas or foreign body. IMPRESSION: Subtle loss of cortical integrity along the distal aspect of the second distal phalanx is suspicious for early osteomyelitis. Electronically Signed   By: Marcey Moan M.D.   On: 12/06/2023 08:45    Microbiology: Results for orders placed or performed during the [name] encounter of 12/06/23  Blood culture (routine x 2)     Status: None   Collection Time: 12/06/23  8:44 AM   Specimen: BLOOD  Result Value Ref Range Status   Specimen Description BLOOD BLOOD RIGHT ARM  Final   Special Requests   Final  BOTTLES DRAWN AEROBIC AND ANAEROBIC Blood Culture adequate volume   Culture   Final    NO GROWTH 5 DAYS Performed at [facility], [address] [place], [place] [address]    Report Status 12/11/2023 FINAL  Final  Blood culture (routine x 2)     Status: None   Collection Time: 12/06/23  8:44 AM   Specimen: BLOOD  Result Value Ref Range Status   Specimen Description BLOOD BLOOD LEFT ARM  Final   Special Requests   Final    BOTTLES DRAWN AEROBIC AND ANAEROBIC Blood Culture adequate volume   Culture   Final    NO GROWTH 5 DAYS Performed at [facility], [address], [place], [place] [address]    Report Status 12/11/2023 FINAL  Final  Surgical pcr screen     Status: None   Collection Time: 12/07/23  6:40 AM   Specimen: Nasal Mucosa; Nasal Swab  Result Value Ref Range Status   MRSA, PCR NEGATIVE NEGATIVE Final   Staphylococcus aureus NEGATIVE NEGATIVE Final    Comment: (NOTE) The Xpert SA Assay (FDA approved for NASAL specimens in patients 71 years of age and older), is one component of a comprehensive surveillance program. It is not intended to diagnose infection nor to guide or monitor treatment. Performed at [name], [address], [place], [place] [address]     Labs: CBC: Recent Labs  Lab 12/06/23 [phone] 12/06/23 0844  12/07/23 0414 12/08/23 0411  WBC  --  9.3 8.0 8.4  NEUTROABS  --  6.6  --   --   HGB 8.5* 8.3* 7.4* 7.2*  HCT 25.0* 26.3* 23.5* 23.7*  MCV  --  91.3 91.8 93.3  PLT  --  431* 365 353   Basic Metabolic Panel: Recent Labs  Lab 12/07/23 0414 12/08/23 0411 12/09/23 0419 12/10/23 0358 12/11/23 0413  NA 141 140 142 140 137  K 4.2 4.5 4.2 4.3 4.6  CL 110 110 113* 110 109  CO2 21* 20* 21* 23 22  GLUCOSE 204* 197* 186* 207* 214*  BUN 43* 48* 40* 32* 30*  CREATININE 3.83* 4.98* 4.53* 4.35* 3.90*  CALCIUM  8.7* 8.2* 8.2* 8.3* 8.3*  MG  --  1.6*  --   --  1.5*  PHOS  --  3.8  --   --  3.3   Liver Function Tests: Recent Labs  Lab 12/06/23 0844 12/11/23 0413  AST 19  --   ALT 12  --   ALKPHOS 148*  --   BILITOT 0.3  --   PROT 8.1  --   ALBUMIN 3.5 2.7*   CBG: Recent Labs  Lab 12/10/23 0735 12/10/23 1101 12/10/23 1607 12/10/23 1925 12/11/23 0736  GLUCAP 173* 279* 177* 184* 177*    Discharge time spent: greater than 30 minutes.  Signed: Bernardino KATHEE Come, MD [facility] 12/11/2023

## 2023-12-11 NOTE — TOC Transition Note (Addendum)
 Transition of Care [facility]) - Discharge Note   Patient Details  Name: Navada Osterhout MRN: [id number] Date of Birth: September 23, 1960  Transition of Care [facility]) CM/SW Contact:  Mcarthur Saddie Kim, LCSW Phone Number: 12/11/2023, 9:40 AM   Clinical Narrative: TOC will fax HHPT/RN order and d/c summary upon completion to [facility]. LCSW notified Olam at [facility] of d/c today. No other needs reported.       Final next level of care: Home w Home Health Services Barriers to Discharge: Barriers Resolved   Patient Goals and CMS Choice Patient states their goals for this hospitalization and ongoing recovery are:: return home CMS Medicare.gov Compare Post Acute Care list provided to:: Patient Choice offered to / list presented to : Patient      Discharge Placement                       Discharge Plan and Services Additional resources added to the After Visit Summary for   In-house Referral: Clinical Social Work Discharge Planning Services: CM Consult Post Acute Care Choice: Home Health                    HH Arranged: RN, PT [facility] Agency: [facility] Date HH Agency Contacted: 12/08/23   Representative spoke with at [facility] Agency: [facility]  Social Drivers of Health (SDOH) Interventions SDOH Screenings   Food Insecurity: No Food Insecurity (12/06/2023)  Housing: Low Risk  (12/06/2023)  Transportation Needs: No Transportation Needs (12/06/2023)  Utilities: Not At Risk (12/06/2023)  Depression (PHQ2-9): Low Risk  (10/07/2019)  Tobacco Use: Low Risk  (12/07/2023)     Readmission Risk Interventions    12/07/2023    1:24 PM  Readmission Risk Prevention Plan  Transportation Screening Complete  PCP or Specialist Appt within 5-7 Days Complete  Home Care Screening Complete  Medication Review (RN CM) Complete

## 2023-12-11 NOTE — Plan of Care (Signed)
   Problem: Education: Goal: Knowledge of General Education information will improve Description: Including pain rating scale, medication(s)/side effects and non-pharmacologic comfort measures Outcome: Progressing   Problem: Activity: Goal: Risk for activity intolerance will decrease Outcome: Progressing   Problem: Coping: Goal: Level of anxiety will decrease Outcome: Progressing

## 2023-12-12 ENCOUNTER — Other Ambulatory Visit (HOSPITAL_COMMUNITY): Payer: Self-pay

## 2023-12-12 ENCOUNTER — Telehealth: Payer: Self-pay | Admitting: *Deleted

## 2023-12-12 NOTE — Telephone Encounter (Signed)
 Surgical Date: 12/07/2023 Procedure: Amputation, Right Second Toe   Received call from Melissa, RN Case Manager with [employer] [place]  [phone]~ telephone.   Requested referral orders to be placed for [facility] for wound care and HH PT.   [facility] [address] [place], [place] [address] [phone] telephone [phone]~ fax  Advised that [place] providers are unable to order [facility] services in [place].   A [place] -licensed physician (MD) cannot generally order home health [facility]) services for a patient physically located in [place]  unless they are also licensed to practice in [place] . The practice of medicine, including ordering medical services like home health, is regulated by the state where the patient is located at the time of service.   Patient PCP is noted as Marites Crumpton, NP at [facility]. Will route request to their office.

## 2023-12-14 ENCOUNTER — Encounter: Payer: Self-pay | Admitting: General Surgery

## 2023-12-14 ENCOUNTER — Ambulatory Visit: Admitting: General Surgery

## 2023-12-14 VITALS — BP 174/91 | HR 99 | Temp 98.7°F | Resp 14 | Ht 67.0 in | Wt 185.0 lb

## 2023-12-14 DIAGNOSIS — Z09 Encounter for follow-up examination after completed treatment for conditions other than malignant neoplasm: Secondary | ICD-10-CM

## 2023-12-14 NOTE — Progress Notes (Signed)
 Subjective:     Brianna [name]  Patient here for follow-up, status post right second toe amputation.  She denies any fever or chills.  She has had no pain at the incision site. Objective:    BP (!) 174/91   Pulse 99   Temp 98.7 F (37.1 C) (Oral)   Resp 14   Ht 5' 7 (1.702 m)   Wt 185 lb (83.9 kg)   SpO2 98%   BMI 28.98 kg/m   General:  alert, cooperative, and no distress  Right second toe amputation site is open.  Remaining sutures removed.  No purulent drainage present.  Cleaned with Q-tip and peroxide.     Assessment:    Status post right second toe amputation.  Wound healing well by secondary intention.    Plan:   Patient to clean wound with soap and water  daily, may apply a Q-tip and peroxide.  Keep wound covered.  Follow-up wound check on 12/26/2023.

## 2023-12-15 ENCOUNTER — Other Ambulatory Visit (HOSPITAL_COMMUNITY): Payer: Self-pay

## 2023-12-15 NOTE — Telephone Encounter (Signed)
 Pharmacy Patient Advocate Encounter  Received notification from [facility] that Prior Authorization for Linezolid  600MG  tablets has been APPROVED from 12/15/23 to 01/12/24   PA #/Case ID/Reference #: [id number]

## 2023-12-15 NOTE — Progress Notes (Signed)
 DOBUTAMINE STRESS ECHOCARDIOGRAM    CHECKLIST FOR CONTRAINDICATIONS:     No  Active or suspected myocarditis or pericarditis   No  Uncontrolled ventricular or atrial dysrhythmia  No  Acute or decompensated CHF/Pulmonary Edema  No  Significant Aortic Stenosis  No  Recent pulmonary emboli or thrombophlebitis   No  Elevated CK/MB or troponin in the last 24 hours   No  Suspected aortic aneurysm or abd. dissection   No  Significant changes in resting EKG   No  Uncontrolled hypertension   No  MI within the past 48 hours      Atropine Contraindications (for treatment of bradyarrythmias):  No Glaucoma  No Heart Transplant Recipient

## 2023-12-15 NOTE — Progress Notes (Signed)
 Brianna [name] [id number] [birth year]   ADULT TRANSPLANT PSYCHOSOCIAL EVALUATION   REFERRAL DATA  Referral Source:  Transplant team  Transplant Type:  Kidney  Referral Reason: Pre-transplant annual evaluation  List status:  Not yet listed Present for Assessment: Patient, Brianna [name] (significant other/primary) Phone Contact With: (Not currently on dialysis)  Brianna [name] is a 63 y.o. year old female, whose psychosocial assessment for transplant candidacy is completed in person today.   Pt. Is accompanied by her significant other, who is identified as the primary caregiver.  Patient is not dialysis dependent.    IDENTIFYING INFORMATION  [address]    [name] [address] Phone Contacts: [phone] (home) Additional Contacts:  1. Brianna [name] (significant other/primary) [phone]  Legal Guardian: Self Legal Next of Kin: Adult children (2); patient is aware of, and in agreement with legal NOK Relationship Status:  Not legally married. 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 Has FMLA.  Education Level:  Some college Literacy: No concerns reported Military Status: No service history Legal Concerns: None reported  FINANCIAL DATA  Primary Health Insurance:  [employer] (Pt. Is paying premium)  Secondary Health Insurance:  n/a Ability to obtain and afford medications: Yes, no concerns accessing/affording medications  Travel/Lodging Reimbursement: N/A Source(s) of Income:  Employment, partner helps with expenses.  Identified Financial Concerns: Pt. Denies financial concerns at this time and does not anticipate any financial concerns post transplant.   LIVING SITUATION  Travel Distance from [place]:  [place], [place] approximately 1 hour drive to [employer]:  Patient Second Hand Smoke Exposure: None  reported Self-Care Level: Independent Ambulation: Independent Transportation: Vision restriction. Patient cannot drive at night.  Limitations: Vision concerns (Left primarily. Gets injections for swelling); No hearing or memory concerns  FAMILY CONSTELLATION Father: Deceased  Mother:  Deceased Sibling(s):  3 brothers, 2 sister Partner/Spouse:  Brianna [name] (partner of 4.5 years, lives in [place], [place] but separate fro patient, 77 yo) Child(ren):  Brianna [name] (32yo, [place], [place]), Brianna [name] (61yo, [place], [place]) Other:  None Reported Family Conflicts: None reported   SOCIAL SUPPORT STABILITY/AVAILABILITY Designated Primary Care partner:  Brianna [name] (lives in [place], [place]. 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 Stability of Relationship:  Stable, supportive, vested; comfortable with medical aspects of care. Present today for this assessment and attended class during initial assessment.   Potential Support Limitations:  None. No secondary though primary is without limitations  BEHAVIORAL HEALTH AND SUBSTANCE USE Appearance: Dressed in sports coach, Adequate grooming, Appears stated age  Mental Status: Alert, Oriented; Patient denies memory changes/concerns  Eye Contact: Good Thought Process: Rational, Logical Thought Content: Appropriate to context; no current/history of suicidal ideation reported   Speech: Normal rate, Normal tone, Normal volume Mood: Normal   Affect: Congruent to endorsed mood, Full-ranging  Insight: Realistic assessment  Judgement: Good  Interaction Style: Cooperative, Engaging   Treatment/Coping Behavioral Health Treatment History: No current/history of intervention reported  Diagnosis: None reported Symptoms Endorsed:  Pt. denies mood changes/concerns, though she presents as possibly managing adjustment to illness symptoms as evidenced by some admission of stress, low volume speech and overall flat affect. She reports a recent toe amputation has he feeling down but denies any functional limitation in her day to day routine. She declines transplant medical psychology referral at this time, but agrees to reach out should she continue to struggle with adjustment or notice any decline in mood.   Psychopharmacological Treatment:  No current/history of intervention reported  Coping Habits:  Sings, Watches TV  Substance Use Alcohol:  Occasional glass of wine (maybe 1 glass 2x weekly) / No significant history of use and willing to quit if directed to do so by medical team. Tobacco: No current use / No history of use  Other Drugs:  No current use / No history of use   Relapse Risk: No risk identified Relapse Risk Factors: None identified Relapse Protective Factors: None identified  ADHERENCE Adherence with Medical Regimen: Overall adherence Medication adherence: Good; Patient manages her own medical regimen.  she  is aware of the indications of the prescription medications.  she utilizes a pill organizer to promote adherence.  Pt. denies missing doses of medications. Patient does not take any prescription pain medications and denies any history of misuse/abuse. Pt. Denies discontinuing medications or making changes to her medications without consulting with her doctor.   Diabetic Regimen Adherence: Pt. Is diabetic and checks her blood sugar 2-3 times per day, as  recommended by her physician. . Pt.'s last A1C is 7.5, though lab value today is not yet posted. Patient is aware she will need to keep A1C below 8 for transplant candidacy.  Physician appointment attendance: Good  Pulmonary rehabilitation: Not applicable to treatment Dialysis attendance: Pt. is not dialysis dependent.  Dialysis knowledge: Aware of various treatment modalities, Able to identify restricted foods, Knowledge of which lab values are monitored, Understands consequences of dietary nonadherence Dialysis Report: n/a   Pt. Reports her kidney disease was caused by diabetes and HTN.    UNDERSTANDING OF TREATMENT  Specific Treatment Knowledge:  Purpose of transplant medications Importance of post-transplant adherence Post-operative recovery course Risk of complications Risk of rejection Level of Understanding: Accurate Source of Treatment Education: Transplant team, Transplant written materials Expectations for Treatment: Realistic Current level of motivation to prepare for Treatment: High Patient's perception of their need for Treatment: Active acceptance of need Present level of consent for Treatment: Willing Reasons for seeking treatment: Increased day to day mobility, Return to work, Programme Researcher, Broadcasting/film/video work, Engineer, Structural, Improved health, Conservator, Museum/gallery life, Improved quality of life  Potential Living Donors:   Sons are considering donation  CLINICAL INTERVENTION Counseling on the following themes: Chronic illness, Adjustment to illness, Asking for help, Support planning Education to patient/family on the following: Caregiver(s) role and responsibilities, Mental health counseling, Biomedical engineer, Health insurance resources, Role of clinical social worker, Signs of clinical depression and anxiety, Support planning, Lifestyle changes, Caregiving contract/agreement Consultation with: Transplant team Discussed new referral to: None New information provided or discussed: Clinical social work scientist, water quality, Clinical social work field seismologist, Clinical social work insurance underwriter  Impression: Patient presents today in-person accompanied by her friend/partner who will serve as primary caregiver. Primary is willing and able to engage in all aspects of the caregiving role. He is comfortable in a hospital setting and  appear strongly vested in care plan. Caregiving is stable at this time.   Patient reports overall adherence to medication regimen. She denies concerns related to alcohol or other substance use at this time. She has never been a smoker. Pt. denies mood changes/concerns, though she presents as possibly managing adjustment to illness symptoms as evidenced by some admission of stress, low volume speech and overall flat affect. She reports a recent toe amputation has he feeling down but denies any functional limitation in her day to day routine. She declines transplant medical psychology referral at this time, but agrees to reach out should she continue to struggle with adjustment or notice any decline in mood.    Recommend to proceed though monitoring of risk factors may be required    Psychosocial Protective Factors:  Solid Care Partner Plan,  Understanding of Illness / Treatment Process,  Receptiveness to Learn,  Adequate Coping Skills,  No Significant History of Psychopathology,  Ability to Sustain Healthy Change,  Tobacco and Other Drug Abstinence,  Reliable Transportation,  Working Relationship with Team,  Adherent to Medical Regimen  Psychosocial Risk Factors:  Adjustment to illness  RECOMMENDATIONS     Patient agrees to monitor her mood and reach out should she notice any decline or new limitation in functioning. Team should maintain a low threshold for symptoms of anxiety or depression and refer to transplant medical psychology as needed.  PLAN OF CARE  Please schedule patient with transplant CSW annually   Discussed with Pt./family   Clinical  social work services will remain available to patient and family during the transplant process.  Patient and/or family members are informed of clinical social work field seismologist.  Case will be discussed in transplant listing conference, please continue to consult if new psychosocial needs arise.  Discussed with patient/family: Yes Agreed upon by patient/family: Yes  Evalene Pandy, MSW, LCSW Transplant Clinical Social Work [email or web address] P: [phone]

## 2023-12-18 ENCOUNTER — Telehealth: Payer: Self-pay | Admitting: Family Medicine

## 2023-12-18 NOTE — Telephone Encounter (Signed)
 STD paperwork completed and faxed to [facility] at [phone]. Confirmation received.   Patient out of work starting 12/06/2023 and may return to work unrestricted on 01/22/2024.

## 2023-12-19 DIAGNOSIS — Z89429 Acquired absence of other toe(s), unspecified side: Secondary | ICD-10-CM | POA: Insufficient documentation

## 2023-12-26 ENCOUNTER — Other Ambulatory Visit: Payer: Self-pay | Admitting: Family Medicine

## 2023-12-26 ENCOUNTER — Encounter: Payer: Self-pay | Admitting: General Surgery

## 2023-12-26 ENCOUNTER — Ambulatory Visit: Admitting: General Surgery

## 2023-12-26 VITALS — BP 177/83 | HR 104 | Temp 99.2°F | Resp 14 | Ht 67.0 in | Wt 181.0 lb

## 2023-12-26 DIAGNOSIS — Z09 Encounter for follow-up examination after completed treatment for conditions other than malignant neoplasm: Secondary | ICD-10-CM

## 2023-12-26 NOTE — Progress Notes (Signed)
 Surgical Date: 12/07/2023 Procedure: Amputation, Toe, Right 2cd   Patient seen in office for wound check.    Amputation site of right second toe noted to have clean dry edges. Wound base noted clean with fibrinous tissue present. Slight swelling of right lower extremity continues.   Patient continues to use post op boot for ambulation. Requested to extend FMLA until she is able to wear regular shoe.

## 2023-12-26 NOTE — Progress Notes (Signed)
 Patient seen by CNA.  Agree with assessment.

## 2024-01-03 NOTE — Anesthesia Postprocedure Evaluation (Signed)
"   Anesthesia Post Note  Patient: Corean Dike  Procedure(s) Performed: AMPUTATION, TOE (Right: Toe)  Patient location during evaluation: PACU Anesthesia Type: General Level of consciousness: awake and alert Pain management: pain level controlled Vital Signs Assessment: post-procedure vital signs reviewed and stable Respiratory status: spontaneous breathing, nonlabored ventilation, respiratory function stable and patient connected to nasal cannula oxygen Cardiovascular status: blood pressure returned to baseline and stable Postop Assessment: no apparent nausea or vomiting Anesthetic complications: no   No notable events documented.   Last Vitals:  Vitals:   12/10/23 2224 12/11/23 0430  BP: (!) 142/95 (!) 154/98  Pulse: 95 85  Resp: 16 17  Temp: 37.2 C 36.9 C  SpO2: 98% 96%    Last Pain:  Vitals:   12/11/23 0503  TempSrc:   PainSc: Asleep                 Andrea Limes      "

## 2024-01-03 NOTE — Transfer of Care (Signed)
 Immediate Anesthesia Transfer of Care Note  Patient: Brianna [name]  Procedure(s) Performed: AMPUTATION, TOE (Right: Toe)  Patient Location: PACU  Anesthesia Type:General  Level of Consciousness: awake  Airway & Oxygen Therapy: Patient Spontanous Breathing and Patient connected to nasal cannula oxygen  Post-op Assessment: Report given to RN  Post vital signs: Reviewed and stable  Last Vitals:  Vitals Value Taken Time  BP    Temp    Pulse    Resp    SpO2      Last Pain:  Vitals:   12/11/23 0503  TempSrc:   PainSc: Asleep      Patients Stated Pain Goal: 4 (12/07/23 1019)  Complications: No notable events documented.

## 2024-01-10 DIAGNOSIS — E11311 Type 2 diabetes mellitus with unspecified diabetic retinopathy with macular edema: Secondary | ICD-10-CM | POA: Insufficient documentation

## 2024-01-11 ENCOUNTER — Encounter: Payer: Self-pay | Admitting: General Surgery

## 2024-01-11 ENCOUNTER — Ambulatory Visit (INDEPENDENT_AMBULATORY_CARE_PROVIDER_SITE_OTHER): Admitting: General Surgery

## 2024-01-11 VITALS — BP 131/75 | HR 98 | Temp 98.7°F | Resp 16 | Ht 67.0 in | Wt 179.0 lb

## 2024-01-11 DIAGNOSIS — Z09 Encounter for follow-up examination after completed treatment for conditions other than malignant neoplasm: Secondary | ICD-10-CM

## 2024-01-12 NOTE — Progress Notes (Signed)
 Subjective:     Brianna [name]  Patient here for postoperative visit, status post right second toe amputation.  She has been keeping the wound clean and dry as able.  She denies any fevers.  She occasionally still has right foot swelling and she elevates her foot at night.  She is wearing a boot. Objective:    BP 131/75   Pulse 98   Temp 98.7 F (37.1 C) (Oral)   Resp 16   Ht 5' 7 (1.702 m)   Wt 179 lb (81.2 kg)   SpO2 96%   BMI 28.04 kg/m   General:  alert, cooperative, and no distress  Right second toe amputation site with some fibrinous tissue overlying the wound.  No purulent drainage is noted.  This was sharply debrided with scissors.  Silver nitrate was then applied.  Granulation tissue was noted at base.     Assessment:    Doing well postoperatively. Second toe amputation site healing well by secondary intention.    Plan:   Continue keeping wound clean and dry with soap and water  daily.  Follow-up here in 2 weeks for wound check.

## 2024-01-25 ENCOUNTER — Encounter: Payer: Self-pay | Admitting: General Surgery

## 2024-01-25 ENCOUNTER — Other Ambulatory Visit: Payer: Self-pay

## 2024-01-25 ENCOUNTER — Ambulatory Visit (INDEPENDENT_AMBULATORY_CARE_PROVIDER_SITE_OTHER): Admitting: General Surgery

## 2024-01-25 VITALS — BP 148/82 | HR 96 | Temp 98.6°F | Resp 18 | Ht 67.0 in | Wt 180.0 lb

## 2024-01-25 DIAGNOSIS — N390 Urinary tract infection, site not specified: Secondary | ICD-10-CM | POA: Insufficient documentation

## 2024-01-25 DIAGNOSIS — R0600 Dyspnea, unspecified: Secondary | ICD-10-CM | POA: Insufficient documentation

## 2024-01-25 DIAGNOSIS — M359 Systemic involvement of connective tissue, unspecified: Secondary | ICD-10-CM | POA: Insufficient documentation

## 2024-01-25 DIAGNOSIS — E663 Overweight: Secondary | ICD-10-CM | POA: Insufficient documentation

## 2024-01-25 DIAGNOSIS — S43409A Unspecified sprain of unspecified shoulder joint, initial encounter: Secondary | ICD-10-CM | POA: Insufficient documentation

## 2024-01-25 DIAGNOSIS — Z09 Encounter for follow-up examination after completed treatment for conditions other than malignant neoplasm: Secondary | ICD-10-CM

## 2024-01-25 DIAGNOSIS — L209 Atopic dermatitis, unspecified: Secondary | ICD-10-CM | POA: Insufficient documentation

## 2024-01-25 NOTE — Progress Notes (Signed)
 Subjective:     Brianna [name]  Patient here for postoperative visit and wound check, status post right second toe amputation.  Patient has been cleaning the wound with soap and water  daily and applying peroxide. Objective:    BP (!) 148/82 (BP Location: Left Arm, Patient Position: Sitting, Cuff Size: Normal) Comment: BP elevated. Advised to F/U with PCP. Comment (Cuff Size): manual cuff  Pulse 96   Temp 98.6 F (37 C) (Oral)   Resp 18   Ht 5' 7 (1.702 m)   Wt 180 lb (81.6 kg)   BMI 28.19 kg/m   General:  alert, cooperative, and no distress  Right second toe amputation site healing well by secondary intention.  Silver nitrate applied to the granulation tissue.  No purulent drainage noted.     Assessment:    Doing well postoperatively. Wound healing by secondary intention    Plan:   Would stop applying peroxide to the wound.  May clean with soap and water  daily.  Follow-up here for wound check in 3 weeks.

## 2024-01-31 NOTE — Telephone Encounter (Signed)
 STD paperwork completed along with notes faxed to [facility] at [phone]. Confirmation received.  Out of work extended with new return to work date of 02/26/2024.

## 2024-02-07 ENCOUNTER — Telehealth: Payer: Self-pay | Admitting: *Deleted

## 2024-02-07 DIAGNOSIS — Z89421 Acquired absence of other right toe(s): Secondary | ICD-10-CM

## 2024-02-07 NOTE — Telephone Encounter (Signed)
 Surgical Date: 12/06/2024 Procedure: Amputation, Right Second Toe  Received call from patient [phone]~ telephone.   Patient reports that she has increased drainage that is more bloody or serosanguinous than pink in color. States that there is a slight odor to foot/ toe, but does not think there is any infection at this time.   States that she has some granulation tissue that is tender.   Reports that she would like referral to wound care in [place]. Advised that referral can be made today, but she may not be scheduled this week depending on availability.   Referral orders sent to [facility]   Patient reports that she wants to be seen by wound care provider today. States that she will be going to ER for evaluation of wound, but states that she may be going to [facility] instead.

## 2024-02-15 ENCOUNTER — Encounter: Admitting: General Surgery
